# Patient Record
Sex: Female | Born: 1945 | Race: White | Hispanic: No | Marital: Married | Smoking: Former smoker
Health system: Southern US, Community
[De-identification: ages and names within clinical notes are randomized; demographics above are authoritative.]

## PROBLEM LIST (undated history)

## (undated) DIAGNOSIS — R87612 Low grade squamous intraepithelial lesion on cytologic smear of cervix (LGSIL): Secondary | ICD-10-CM

## (undated) DIAGNOSIS — R87613 High grade squamous intraepithelial lesion on cytologic smear of cervix (HGSIL): Secondary | ICD-10-CM

## (undated) DIAGNOSIS — C50919 Malignant neoplasm of unspecified site of unspecified female breast: Secondary | ICD-10-CM

## (undated) DIAGNOSIS — D649 Anemia, unspecified: Secondary | ICD-10-CM

## (undated) DIAGNOSIS — Z9889 Other specified postprocedural states: Secondary | ICD-10-CM

## (undated) DIAGNOSIS — R112 Nausea with vomiting, unspecified: Secondary | ICD-10-CM

## (undated) DIAGNOSIS — Z923 Personal history of irradiation: Secondary | ICD-10-CM

## (undated) DIAGNOSIS — C4491 Basal cell carcinoma of skin, unspecified: Secondary | ICD-10-CM

## (undated) DIAGNOSIS — M8580 Other specified disorders of bone density and structure, unspecified site: Secondary | ICD-10-CM

## (undated) DIAGNOSIS — K5792 Diverticulitis of intestine, part unspecified, without perforation or abscess without bleeding: Secondary | ICD-10-CM

## (undated) DIAGNOSIS — G4762 Sleep related leg cramps: Secondary | ICD-10-CM

## (undated) DIAGNOSIS — K219 Gastro-esophageal reflux disease without esophagitis: Secondary | ICD-10-CM

## (undated) HISTORY — PX: HAND SURGERY: SHX662

## (undated) HISTORY — DX: Diverticulitis of intestine, part unspecified, without perforation or abscess without bleeding: K57.92

## (undated) HISTORY — DX: Gastro-esophageal reflux disease without esophagitis: K21.9

## (undated) HISTORY — DX: Low grade squamous intraepithelial lesion on cytologic smear of cervix (LGSIL): R87.612

## (undated) HISTORY — DX: Other specified disorders of bone density and structure, unspecified site: M85.80

## (undated) HISTORY — PX: EYE SURGERY: SHX253

## (undated) HISTORY — DX: Malignant neoplasm of unspecified site of unspecified female breast: C50.919

## (undated) HISTORY — PX: CERVICAL BIOPSY  W/ LOOP ELECTRODE EXCISION: SUR135

---
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
## 1982-09-22 HISTORY — PX: TUBAL LIGATION: SHX77

## 1985-09-22 DIAGNOSIS — C50919 Malignant neoplasm of unspecified site of unspecified female breast: Secondary | ICD-10-CM

## 1985-09-22 HISTORY — PX: BREAST LUMPECTOMY: SHX2

## 1985-09-22 HISTORY — PX: BREAST SURGERY: SHX581

## 1985-09-22 HISTORY — DX: Malignant neoplasm of unspecified site of unspecified female breast: C50.919

## 1987-09-23 HISTORY — PX: OOPHORECTOMY: SHX86

## 1998-05-29 ENCOUNTER — Ambulatory Visit (HOSPITAL_COMMUNITY): Admission: RE | Admit: 1998-05-29 | Discharge: 1998-05-29 | Payer: Self-pay | Admitting: Family Medicine

## 2001-02-16 ENCOUNTER — Encounter: Payer: Self-pay | Admitting: Obstetrics and Gynecology

## 2001-02-16 ENCOUNTER — Encounter: Admission: RE | Admit: 2001-02-16 | Discharge: 2001-02-16 | Payer: Self-pay | Admitting: Obstetrics and Gynecology

## 2001-02-24 ENCOUNTER — Other Ambulatory Visit: Admission: RE | Admit: 2001-02-24 | Discharge: 2001-02-24 | Payer: Self-pay | Admitting: Obstetrics and Gynecology

## 2001-06-28 ENCOUNTER — Ambulatory Visit (HOSPITAL_COMMUNITY): Admission: RE | Admit: 2001-06-28 | Discharge: 2001-06-28 | Payer: Self-pay | Admitting: Gastroenterology

## 2001-06-28 ENCOUNTER — Encounter (INDEPENDENT_AMBULATORY_CARE_PROVIDER_SITE_OTHER): Payer: Self-pay

## 2001-08-02 ENCOUNTER — Encounter: Payer: Self-pay | Admitting: Gastroenterology

## 2001-08-02 ENCOUNTER — Ambulatory Visit (HOSPITAL_COMMUNITY): Admission: RE | Admit: 2001-08-02 | Discharge: 2001-08-02 | Payer: Self-pay | Admitting: Gastroenterology

## 2003-06-12 ENCOUNTER — Encounter: Payer: Self-pay | Admitting: Obstetrics and Gynecology

## 2003-06-12 ENCOUNTER — Encounter: Admission: RE | Admit: 2003-06-12 | Discharge: 2003-06-12 | Payer: Self-pay | Admitting: Obstetrics and Gynecology

## 2003-06-15 ENCOUNTER — Other Ambulatory Visit: Admission: RE | Admit: 2003-06-15 | Discharge: 2003-06-15 | Payer: Self-pay | Admitting: Obstetrics and Gynecology

## 2003-09-06 ENCOUNTER — Encounter: Admission: RE | Admit: 2003-09-06 | Discharge: 2003-09-06 | Payer: Self-pay | Admitting: Obstetrics and Gynecology

## 2004-10-21 ENCOUNTER — Encounter: Admission: RE | Admit: 2004-10-21 | Discharge: 2004-10-21 | Payer: Self-pay | Admitting: Obstetrics and Gynecology

## 2004-10-31 ENCOUNTER — Other Ambulatory Visit: Admission: RE | Admit: 2004-10-31 | Discharge: 2004-10-31 | Payer: Self-pay | Admitting: Obstetrics and Gynecology

## 2006-10-16 ENCOUNTER — Encounter: Admission: RE | Admit: 2006-10-16 | Discharge: 2006-10-16 | Payer: Self-pay | Admitting: Obstetrics and Gynecology

## 2006-11-02 ENCOUNTER — Other Ambulatory Visit: Admission: RE | Admit: 2006-11-02 | Discharge: 2006-11-02 | Payer: Self-pay | Admitting: Obstetrics and Gynecology

## 2009-07-26 ENCOUNTER — Encounter: Admission: RE | Admit: 2009-07-26 | Discharge: 2009-07-26 | Payer: Self-pay | Admitting: Obstetrics and Gynecology

## 2009-08-06 ENCOUNTER — Encounter: Payer: Self-pay | Admitting: Obstetrics and Gynecology

## 2009-08-06 ENCOUNTER — Other Ambulatory Visit: Admission: RE | Admit: 2009-08-06 | Discharge: 2009-08-06 | Payer: Self-pay | Admitting: Obstetrics and Gynecology

## 2009-08-06 ENCOUNTER — Ambulatory Visit: Payer: Self-pay | Admitting: Obstetrics and Gynecology

## 2009-08-20 ENCOUNTER — Ambulatory Visit: Payer: Self-pay | Admitting: Obstetrics and Gynecology

## 2009-09-25 ENCOUNTER — Ambulatory Visit: Payer: Self-pay | Admitting: Obstetrics and Gynecology

## 2011-02-07 NOTE — Procedures (Signed)
[facility]  Patient:    Christina Edwards, Christina Edwards Visit Number: [id number] MRN: [id number]          Service Type: END Location: ENDO Attending Physician:  Louie Bun Dictated by:   Everardo All Madilyn Fireman, M.D. Proc. Date: 06/28/01 Admit Date:  06/28/2001   CC:         Reuel Boom L. Eda Paschal, M.D.   Procedure Report  PROCEDURE:  Colonoscopy with polypectomy.  SURGEON:  John C. Madilyn Fireman, M.D.  INDICATIONS FOR PROCEDURE:  Intermittent rectal bleeding in a patient with a family history of colon cancer in a first degree relative and personal history of breast cancer.  DESCRIPTION OF PROCEDURE:  The patient was placed in the left lateral decubitus position and placed on the pulse monitor with continuous low flow oxygen delivered by nasal cannula.  She was sedated with 70 mg of IV Demerol and 8 mg of IV Versed.  The Olympus video colonoscope was inserted into the rectum and advanced as far as possible, but despite insertion of the scope its entire length and multiple position changes, abdominal pressure, and torquing maneuvers, I was unable to reach the cecum.  It was not certain, but it was felt that the point of most proximal visualization was probably near the hepatic flexure.  This area, as well as the transverse, descending, and sigmoid colon appeared normal with no masses, polyps, diverticula, or other mucosal abnormalities.  At the rectosigmoid junction, there was a 6 mm sessile polyp which was fulgurated by hot biopsy.  The remainder of the rectum and sigmoid appeared normal.  The colonoscope was then withdrawn and the patient returned to the recovery room in stable condition.  She tolerated the procedure well and there were no immediate complications.  IMPRESSION:  _______ polyp.  PLAN:  Await biopsy results and will need barium enema to visualize the proximal colon at some point. Dictated by:   Everardo All Madilyn Fireman, M.D. Attending Physician:  Louie Bun DD:  06/28/01 TD:  06/28/01 Job: [id number]

## 2011-04-30 ENCOUNTER — Other Ambulatory Visit: Payer: Self-pay | Admitting: Obstetrics and Gynecology

## 2011-04-30 DIAGNOSIS — Z1231 Encounter for screening mammogram for malignant neoplasm of breast: Secondary | ICD-10-CM

## 2011-05-12 ENCOUNTER — Ambulatory Visit
Admission: RE | Admit: 2011-05-12 | Discharge: 2011-05-12 | Disposition: A | Discharge status: Home / Self Care | Payer: 59 | Source: Ambulatory Visit | Attending: Obstetrics and Gynecology | Admitting: Obstetrics and Gynecology

## 2011-05-12 DIAGNOSIS — Z1231 Encounter for screening mammogram for malignant neoplasm of breast: Secondary | ICD-10-CM

## 2011-05-14 ENCOUNTER — Encounter: Payer: Self-pay | Admitting: Gynecology

## 2011-05-14 DIAGNOSIS — N809 Endometriosis, unspecified: Secondary | ICD-10-CM | POA: Insufficient documentation

## 2011-05-14 DIAGNOSIS — C50919 Malignant neoplasm of unspecified site of unspecified female breast: Secondary | ICD-10-CM | POA: Insufficient documentation

## 2011-05-14 DIAGNOSIS — M8580 Other specified disorders of bone density and structure, unspecified site: Secondary | ICD-10-CM | POA: Insufficient documentation

## 2011-05-27 ENCOUNTER — Other Ambulatory Visit (HOSPITAL_COMMUNITY)
Admission: RE | Admit: 2011-05-27 | Discharge: 2011-05-27 | Disposition: A | Discharge status: Home / Self Care | Payer: 59 | Source: Ambulatory Visit | Attending: Obstetrics and Gynecology | Admitting: Obstetrics and Gynecology

## 2011-05-27 ENCOUNTER — Ambulatory Visit (INDEPENDENT_AMBULATORY_CARE_PROVIDER_SITE_OTHER): Payer: 59 | Admitting: Obstetrics and Gynecology

## 2011-05-27 ENCOUNTER — Encounter: Payer: Self-pay | Admitting: Obstetrics and Gynecology

## 2011-05-27 VITALS — BP 102/66 | Ht 70.0 in | Wt 134.0 lb

## 2011-05-27 DIAGNOSIS — Z01419 Encounter for gynecological examination (general) (routine) without abnormal findings: Secondary | ICD-10-CM | POA: Insufficient documentation

## 2011-05-27 DIAGNOSIS — R82998 Other abnormal findings in urine: Secondary | ICD-10-CM

## 2011-05-27 DIAGNOSIS — R252 Cramp and spasm: Secondary | ICD-10-CM

## 2011-05-27 DIAGNOSIS — Z13220 Encounter for screening for lipoid disorders: Secondary | ICD-10-CM

## 2011-05-27 DIAGNOSIS — N952 Postmenopausal atrophic vaginitis: Secondary | ICD-10-CM

## 2011-05-27 NOTE — Progress Notes (Signed)
Patient came to see me today for an annual GYN exam. She has met someone she's now sexually active. She says most of the time she is adequately lubricated. She is however sometimes dry and uses a lubricant. She is orgasmic most of the time. She wanted no know if there was something she could do to make it all the time. She is up-to-date on mammograms. She does her bone density through our office. She does have osteopenia. She sometimes gets leg cramps. She can also get him on the outside of her left hip as well. 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 I don't know whether estrogen receptors were done. She had a lumpectomy by Dr. Rolene Course. She then had radiation treatment. She does not have a medical oncologist.  Physical examination: HEENT within normal limits. Neck: Thyroid not large. No masses. Supraclavicular nodes: not enlarged. Breasts: Examined in both sitting midline position. No skin changes and no masses. Abdomen: Soft no guarding rebound or masses or hernia. Pelvic: External: Within normal limits. BUS: Within normal limits. Vaginal:within normal limits. Good estrogen effect. No evidence of cystocele rectocele or enterocele. Cervix: clean. Uterus: Normal size and shape. Adnexa: No masses. Rectovaginal exam: Confirmatory and negative. Extremities: Within normal limits.  Assessment: 1. Intraductal carcinoma, no existing disease. 2. Occasional atrophic vaginitis 3. Leg cramps and hip cramps.  Plan: 1. Continue yearly mammograms 2. Patient to call Dorena Cookey and schedule another colonoscopy as she is overdue. Her mother had colon cancer. 3. Use replens and astroglyde. If not enough call me and we will discuss estrogen cream and testosterone with Dr. Jamey Ripa.

## 2011-05-27 NOTE — Patient Instructions (Signed)
Call me if medication needed.

## 2011-05-28 LAB — BASIC METABOLIC PANEL
BUN: 17 mg/dL (ref 6–23)
CO2: 26 mEq/L (ref 19–32)
Calcium: 10 mg/dL (ref 8.4–10.5)
Chloride: 108 mEq/L (ref 96–112)
Creat: 0.85 mg/dL (ref 0.50–1.10)
Glucose, Bld: 92 mg/dL (ref 70–99)
Potassium: 5.2 mEq/L (ref 3.5–5.3)
Sodium: 141 mEq/L (ref 135–145)

## 2011-09-03 ENCOUNTER — Other Ambulatory Visit: Payer: Self-pay | Admitting: Gastroenterology

## 2011-10-01 ENCOUNTER — Other Ambulatory Visit: Payer: Self-pay | Admitting: Gastroenterology

## 2011-10-03 ENCOUNTER — Ambulatory Visit
Admission: RE | Admit: 2011-10-03 | Discharge: 2011-10-03 | Disposition: A | Discharge status: Home / Self Care | Payer: 59 | Source: Ambulatory Visit | Attending: Gastroenterology | Admitting: Gastroenterology

## 2012-06-18 ENCOUNTER — Other Ambulatory Visit: Payer: Self-pay | Admitting: Obstetrics and Gynecology

## 2012-06-18 DIAGNOSIS — Z1231 Encounter for screening mammogram for malignant neoplasm of breast: Secondary | ICD-10-CM

## 2012-07-12 ENCOUNTER — Ambulatory Visit
Admission: RE | Admit: 2012-07-12 | Discharge: 2012-07-12 | Disposition: A | Discharge status: Home / Self Care | Payer: 59 | Source: Ambulatory Visit | Attending: Obstetrics and Gynecology | Admitting: Obstetrics and Gynecology

## 2012-07-12 DIAGNOSIS — Z1231 Encounter for screening mammogram for malignant neoplasm of breast: Secondary | ICD-10-CM

## 2012-07-20 ENCOUNTER — Encounter: Payer: 59 | Admitting: Obstetrics and Gynecology

## 2012-07-21 ENCOUNTER — Ambulatory Visit (INDEPENDENT_AMBULATORY_CARE_PROVIDER_SITE_OTHER): Payer: 59 | Admitting: Obstetrics and Gynecology

## 2012-07-21 ENCOUNTER — Other Ambulatory Visit (HOSPITAL_COMMUNITY)
Admission: RE | Admit: 2012-07-21 | Discharge: 2012-07-21 | Disposition: A | Discharge status: Home / Self Care | Payer: 59 | Source: Ambulatory Visit | Attending: Obstetrics and Gynecology | Admitting: Obstetrics and Gynecology

## 2012-07-21 ENCOUNTER — Inpatient Hospital Stay (HOSPITAL_COMMUNITY): Admission: RE | Admit: 2012-07-21 | Payer: Self-pay | Source: Ambulatory Visit

## 2012-07-21 ENCOUNTER — Encounter: Payer: Self-pay | Admitting: Obstetrics and Gynecology

## 2012-07-21 VITALS — BP 120/76 | Ht 69.0 in | Wt 138.0 lb

## 2012-07-21 DIAGNOSIS — Z01419 Encounter for gynecological examination (general) (routine) without abnormal findings: Secondary | ICD-10-CM | POA: Insufficient documentation

## 2012-07-21 DIAGNOSIS — Z1151 Encounter for screening for human papillomavirus (HPV): Secondary | ICD-10-CM | POA: Insufficient documentation

## 2012-07-21 DIAGNOSIS — R87810 Cervical high risk human papillomavirus (HPV) DNA test positive: Secondary | ICD-10-CM | POA: Insufficient documentation

## 2012-07-21 DIAGNOSIS — K219 Gastro-esophageal reflux disease without esophagitis: Secondary | ICD-10-CM | POA: Insufficient documentation

## 2012-07-21 LAB — CBC WITH DIFFERENTIAL/PLATELET
Basophils Absolute: 0 10*3/uL (ref 0.0–0.1)
Basophils Relative: 0 % (ref 0–1)
Eosinophils Absolute: 0.1 10*3/uL (ref 0.0–0.7)
Eosinophils Relative: 2 % (ref 0–5)
HCT: 38 % (ref 36.0–46.0)
Hemoglobin: 12.7 g/dL (ref 12.0–15.0)
Lymphocytes Relative: 38 % (ref 12–46)
Lymphs Abs: 2.1 10*3/uL (ref 0.7–4.0)
MCH: 32.1 pg (ref 26.0–34.0)
MCHC: 33.4 g/dL (ref 30.0–36.0)
MCV: 96 fL (ref 78.0–100.0)
Monocytes Absolute: 0.4 10*3/uL (ref 0.1–1.0)
Monocytes Relative: 7 % (ref 3–12)
Neutro Abs: 3 10*3/uL (ref 1.7–7.7)
Neutrophils Relative %: 53 % (ref 43–77)
Platelets: 325 10*3/uL (ref 150–400)
RBC: 3.96 MIL/uL (ref 3.87–5.11)
RDW: 13.6 % (ref 11.5–15.5)
WBC: 5.6 10*3/uL (ref 4.0–10.5)

## 2012-07-21 NOTE — Progress Notes (Signed)
Patient came to see me today for her annual GYN exam. She is doing well. She still has hot flashes but they're tolerable. She has some vaginal dryness but does not need medication. She is status post lumpectomy and radiation in 1987 for a left breast intraductal carcinoma. She was 66 years old. Her mother had ovarian cancer. We checked Corrie Dandy for BRCA1 and BRCA2 and she was negative.  She has yearly mammograms. Her last was September, 2013. She has not had a recurrence. We are watching her with osteopenia. Her fracture risk is nonelevated. She is due for followup bone density. She is having no vaginal bleeding. She is having no pelvic pain. In 1989 she had a left salpingo-oophorectomy for endometriosis. She has always had normal Pap smears. Her last Pap smear was 2012.  Physical examination:Christina Edwards present. HEENT within normal limits. Neck: Thyroid not large. No masses. Supraclavicular nodes: not enlarged. Breasts: Examined in both sitting and lying  position. No skin changes and no masses. Abdomen: Soft no guarding rebound or masses or hernia. Pelvic: External: Within normal limits. BUS: Within normal limits. Vaginal:within normal limits. Good estrogen effect. No evidence of cystocele rectocele or enterocele. Cervix: clean. Uterus: Normal size and shape. Adnexa: No masses. Rectovaginal exam: Confirmatory and negative. Extremities: Within normal limits.  Assessment: #1. Breast cancer at 47 years old #2. Mother with ovarian cancer #3. Patient BRCA1 and BRCA2 negative. #4. Osteopenia #5. Menopausal symptoms-tolerable.  Plan: Continue yearly mammograms. Schedule  bone density.The new Pap smear guidelines were discussed with the patient. Pap done at patient's request.

## 2012-07-21 NOTE — Patient Instructions (Signed)
Schedule bone density.    

## 2012-07-21 NOTE — Addendum Note (Signed)
Addended by: Dayna Barker on: 07/21/2012 10:46 AM   Modules accepted: Orders

## 2012-07-22 LAB — URINALYSIS W MICROSCOPIC + REFLEX CULTURE
Bacteria, UA: NONE SEEN
Bilirubin Urine: NEGATIVE
Casts: NONE SEEN
Crystals: NONE SEEN
Glucose, UA: NEGATIVE mg/dL
Hgb urine dipstick: NEGATIVE
Ketones, ur: NEGATIVE mg/dL
Leukocytes, UA: NEGATIVE
Nitrite: NEGATIVE
Protein, ur: NEGATIVE mg/dL
Specific Gravity, Urine: <1.005 — ABNORMAL LOW (ref 1.005–1.030)
Squamous Epithelial / LPF: NONE SEEN
Urobilinogen, UA: 0.2 mg/dL (ref 0.0–1.0)
pH: 7 (ref 5.0–8.0)

## 2012-07-26 ENCOUNTER — Encounter: Payer: Self-pay | Admitting: Obstetrics and Gynecology

## 2012-08-25 ENCOUNTER — Ambulatory Visit (INDEPENDENT_AMBULATORY_CARE_PROVIDER_SITE_OTHER): Payer: Medicare Other | Admitting: Obstetrics and Gynecology

## 2012-08-25 DIAGNOSIS — N871 Moderate cervical dysplasia: Secondary | ICD-10-CM

## 2012-08-25 DIAGNOSIS — B977 Papillomavirus as the cause of diseases classified elsewhere: Secondary | ICD-10-CM

## 2012-08-25 NOTE — Addendum Note (Signed)
Addended by: Dayna Barker on: 08/25/2012 04:42 PM   Modules accepted: Orders

## 2012-08-25 NOTE — Progress Notes (Signed)
Subjective:     Patient ID: Christina Edwards, female   DOB: [birth year], 66 y.o.   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 She has always had normal Pap smears. She started a new sexual relationship one year ago after being not sexually active  for 10 years. She returns for colposcopy.   Review of Systemsnot applicable.     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    Plan:     ECC, blind biopsies, and biopsy of white epithelium done. Discussed LEEP with patient. Booklet given. We will call her with pathology

## 2012-08-25 NOTE — Patient Instructions (Signed)
We will call you with biopsy results.

## 2012-08-30 ENCOUNTER — Other Ambulatory Visit: Payer: Self-pay | Admitting: Obstetrics and Gynecology

## 2012-08-30 DIAGNOSIS — M8580 Other specified disorders of bone density and structure, unspecified site: Secondary | ICD-10-CM

## 2012-08-31 ENCOUNTER — Ambulatory Visit (INDEPENDENT_AMBULATORY_CARE_PROVIDER_SITE_OTHER): Payer: Medicare Other

## 2012-08-31 DIAGNOSIS — M899 Disorder of bone, unspecified: Secondary | ICD-10-CM

## 2012-08-31 DIAGNOSIS — M8580 Other specified disorders of bone density and structure, unspecified site: Secondary | ICD-10-CM

## 2012-08-31 DIAGNOSIS — M949 Disorder of cartilage, unspecified: Secondary | ICD-10-CM

## 2012-09-01 ENCOUNTER — Ambulatory Visit: Payer: 59 | Admitting: Obstetrics and Gynecology

## 2012-09-06 ENCOUNTER — Ambulatory Visit (INDEPENDENT_AMBULATORY_CARE_PROVIDER_SITE_OTHER): Payer: Medicare Other | Admitting: Obstetrics and Gynecology

## 2012-09-06 DIAGNOSIS — N871 Moderate cervical dysplasia: Secondary | ICD-10-CM

## 2012-09-06 DIAGNOSIS — B977 Papillomavirus as the cause of diseases classified elsewhere: Secondary | ICD-10-CM

## 2012-09-06 NOTE — Progress Notes (Signed)
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   Anesthesia: see below   Procedure:  LEEP (Loop electrosurgical excision procedure) Description of Operation:  After the patient was verbally counseled the patient was placed in the low lithotomy position.  A coated speculum was inserted into the vagina and colposcopic examination was performed with 4% acidic acid with findings noted above.    Approximately 2 cc's of 2% xylocaine with epinephrine was infiltrated deep near the outer margin of the transformation zone circumferentially at 12, 3, 6, and 9 o'clock positions.  The [facility] Electrosurgical Generator was then turned on after the patient was grounded with pad electrode on her thigh and jewelry removed.  The settings on the generator were Blend 1 current 60 watts cut and 60  watts on the coagulation mode.  A size 20x12 loop electrode was utilized to exercise the atypical transformation zone.  The tip of the electrode was placed 3 mm from the edge of the lesion at 3  o'clock position.  The electrode was moved slowly over the lesion ( initially without the generator power ) to determine if lesion was within the loop limits.  A vaginal wall retractor was not  used.  The loop was then repositioned and the  footpedal depressed.  A slight pressure on the shaft was applied and the loop was extended into the tissue up to its crossbar to a depth of 7 mm, then with steady, slow motion across and underneath the lesional tissue exited 3 mm beyond the edge of the lesion. An endocervical button  Was not excised.  The loop electrode was replaced with ball electrode set at 50 watts and the base of the crater was fulgurated circumferentially.  Monsell's paint was then applied for additional hemostasis.  The specimen was cut suture was  at 12 o'clock position of  cervical biopsy specimen for orientation, and placed in formation fixative for pathology evaluation.  Patient tolerated the procedure well with minimal blood loss and without any complications.  After the procedure patient left office with stable vital signs and instructions sheet.

## 2012-09-06 NOTE — Patient Instructions (Signed)
We will call you with pathology report. 

## 2012-09-06 NOTE — Addendum Note (Signed)
Addended by: Dayna Barker on: 09/06/2012 12:59 PM   Modules accepted: Orders

## 2012-09-10 DIAGNOSIS — N871 Moderate cervical dysplasia: Secondary | ICD-10-CM | POA: Insufficient documentation

## 2012-10-07 ENCOUNTER — Institutional Professional Consult (permissible substitution): Payer: Medicare Other | Admitting: Women's Health

## 2012-10-08 ENCOUNTER — Ambulatory Visit (INDEPENDENT_AMBULATORY_CARE_PROVIDER_SITE_OTHER): Payer: Medicare Other | Admitting: Gynecology

## 2012-10-08 ENCOUNTER — Encounter: Payer: Self-pay | Admitting: Gynecology

## 2012-10-08 DIAGNOSIS — N949 Unspecified condition associated with female genital organs and menstrual cycle: Secondary | ICD-10-CM

## 2012-10-08 DIAGNOSIS — N871 Moderate cervical dysplasia: Secondary | ICD-10-CM

## 2012-10-08 NOTE — Progress Notes (Signed)
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 She had her first abnormal Pap smear October 2013 showing low-grade SIL with positive high risk HPV. 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 She was recommended for 6 month Pap smear but has a lot of questions particularly pertaining to HPV. Historically she resumed intercourse approximately one year ago after a number of years of abstinence. No history of abnormal Pap smears prior to this episode. I discussed with her that more likely her most recent partner is the source of her HPV although cannot be sure that she did not carry this from years ago exposure. Natural history of usual clearance between 1-2 years also reviewed. She did have a high risk screen and the increased potential for high-grade dysplasia and cancer reviewed. I also discussed the sexually-transmitted nature and she had many questions as far as condoms/oral contact and other contact issues. I reviewed all this with her to her satisfaction and she plans on returning in 6 months for Pap smear. The need for self vulvar exam was also discussed and the potential for vulvar lesions reviewed.

## 2012-10-08 NOTE — Patient Instructions (Signed)
Follow up in 6 months for Pap smear. Sooner if any questions.

## 2013-03-07 ENCOUNTER — Other Ambulatory Visit (HOSPITAL_COMMUNITY)
Admission: RE | Admit: 2013-03-07 | Discharge: 2013-03-07 | Disposition: A | Discharge status: Home / Self Care | Payer: Medicare Other | Source: Ambulatory Visit | Attending: Gynecology | Admitting: Gynecology

## 2013-03-07 ENCOUNTER — Ambulatory Visit (INDEPENDENT_AMBULATORY_CARE_PROVIDER_SITE_OTHER): Payer: Medicare Other | Admitting: Gynecology

## 2013-03-07 ENCOUNTER — Encounter: Payer: Self-pay | Admitting: Gynecology

## 2013-03-07 DIAGNOSIS — R87810 Cervical high risk human papillomavirus (HPV) DNA test positive: Secondary | ICD-10-CM | POA: Insufficient documentation

## 2013-03-07 DIAGNOSIS — Z01419 Encounter for gynecological examination (general) (routine) without abnormal findings: Secondary | ICD-10-CM | POA: Insufficient documentation

## 2013-03-07 DIAGNOSIS — Z1151 Encounter for screening for human papillomavirus (HPV): Secondary | ICD-10-CM | POA: Insufficient documentation

## 2013-03-07 DIAGNOSIS — N871 Moderate cervical dysplasia: Secondary | ICD-10-CM

## 2013-03-07 NOTE — Addendum Note (Signed)
Addended by: Dayna Barker on: 03/07/2013 10:01 AM   Modules accepted: Orders

## 2013-03-07 NOTE — Progress Notes (Signed)
Patient presents for followup Pap smear. 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 She had her first abnormal Pap smear October 2013 showing low-grade SIL with positive high risk HPV. 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 She was recommended for 6 month Pap smear but has a lot of questions particularly pertaining to HPV.  Patient also notes spotting after intercourse several times in the last month. No spontaneous spotting.  Exam was Kym Asst. External BUS vagina with atrophic changes.  Cervix flush with the upper vagina with of granulation tissue from the os. Pap/HPV done. Granulation tissue removed with biopsy forceps and sent to pathology. Silver nitrate applied afterwards. Bimanual uterus normal size, mobile nontender. Adnexa without masses or tenderness.  Assessment and plan: 1. History of dysplasia as outlined above. If Pap smear normal in plan one year followup. She asked if it was positive then we would proceed with colposcopy. I did relate with her though her cervix is flush with the upper vagina and I think retreatment such as repeat LEEP would be difficult. Will rediscuss as needed. 2. Granulation tissue. I think this accounts for her postcoital spotting. It was removed with silver nitrate applied. If spotting continues she'll call and we'll pursue more involved evaluation. She'll followup for the pathology results.

## 2013-03-07 NOTE — Patient Instructions (Addendum)
Office will call you with the biopsy and Pap smear results.

## 2013-04-07 ENCOUNTER — Encounter: Payer: Self-pay | Admitting: Gynecology

## 2013-04-07 ENCOUNTER — Ambulatory Visit (INDEPENDENT_AMBULATORY_CARE_PROVIDER_SITE_OTHER): Payer: Medicare Other | Admitting: Gynecology

## 2013-04-07 DIAGNOSIS — N882 Stricture and stenosis of cervix uteri: Secondary | ICD-10-CM

## 2013-04-07 DIAGNOSIS — R6889 Other general symptoms and signs: Secondary | ICD-10-CM

## 2013-04-07 DIAGNOSIS — IMO0002 Reserved for concepts with insufficient information to code with codable children: Secondary | ICD-10-CM

## 2013-04-07 NOTE — Patient Instructions (Signed)
Office will call you with the biopsy results 

## 2013-04-07 NOTE — Progress Notes (Signed)
Patient ID: Christina Edwards, female   DOB: [birth year], 67 y.o.   MRN: [id number] Patient presents for colposcopy with a history of  her first abnormal Pap smear October 2013 showing low-grade SIL with positive high risk HPV. 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  Followup Pap smear 03/2013 showed LGSIL 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, negative subtype 16/18/45.   Exam was Administrator, [employer] vagina with atrophic changes. Cervix scarred from the LEEP with cervical os stenosis. High anterior in the vaginal vault.  Paracervical block 1% lidocaine was placed and single-tooth tenaculum anterior lip stabilization to allow visualization of the cervix. Pinpoint cervical os noted.   Colposcopy was performed after acetic acid cleanse and no abnormalities but no transformation zone visualized. Sequential dilatation performed. I was unable to sound the uterus with gentle pressure but was able to negotiate the endocervical canal. ECC was performed. Physical Exam  Genitourinary:      Assessment and plan. History of cervical dysplasia as above note.  Cervix atrophic and scarred. Will followup for ECC results. If negative plan expected management with repeat Pap smear in 6 months. If otherwise discussed possible need for repeat LEEP versus hysterectomy.  My concern with the LEEP is the scarring of the cervix. She does have a history of exploratory laparotomy for endometriosis with removal of her left ovary. Possible LAVH versus TAH. Will rediscuss with pathology results. She does clearly understand that even with hysterectomy the virus is a global infection and she is at risk for persistent vaginal dysplasia or vulvar dysplasia.

## 2013-04-07 NOTE — Addendum Note (Signed)
Addended by: Dayna Barker on: 04/07/2013 09:54 AM   Modules accepted: Orders

## 2013-04-12 ENCOUNTER — Telehealth: Payer: Self-pay | Admitting: Gynecology

## 2013-04-12 NOTE — Telephone Encounter (Signed)
Forwarded to [employer] to do referrral to [employer].

## 2013-04-12 NOTE — Telephone Encounter (Signed)
I called patient with ECC results from her recent inadequate normal colposcopy.  She has a history of  her first abnormal Pap smear October 2013 showing low-grade SIL with positive high risk HPV. 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  Followup Pap smear 03/2013 showed LGSIL 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, negative subtype 16/18/45. Most recent ECC showed fragments of dysplastic epithelium and under comments "favor low-grade SIL".  Discussed with patient the issue as to whether to proceed with something more aggressive like attempted re\re LEEP recognizing her scarred anatomic limitations up to and including hysterectomy. I've recommended that we pursue a second opinion with a gynecologic oncologist and that we will go ahead and set this up through [facility] for a second opinion consult. Patient agrees with this and knows the importance of followup and then will further discuss after the second opinion consult.

## 2013-04-13 ENCOUNTER — Telehealth: Payer: Self-pay | Admitting: *Deleted

## 2013-04-13 NOTE — Telephone Encounter (Signed)
Message copied by Aura Camps on Wed Apr 13, 2013  9:39 AM ------      Message from: Keenan Bachelor      Created: Tue Apr 12, 2013 12:36 PM      Regarding: referral       Call Documentation         Dara Lords, MD at 04/12/2013 12:27 PM         Status: Signed                                  I called patient with ECC results from her recent inadequate normal colposcopy.  She has a history of  her first abnormal Pap smear October 2013 showing low-grade SIL with positive high risk HPV. 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  Followup Pap smear 03/2013 showed LGSIL 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, negative subtype 16/18/45. Most recent ECC showed fragments of dysplastic epithelium and under comments "favor low-grade SIL".                  Discussed with patient the issue as to whether to proceed with something more aggressive like attempted re\re LEEP recognizing her scarred anatomic limitations up to and including hysterectomy. I've recommended that we pursue a second opinion with a gynecologic oncologist and that we will go ahead and set this up through [facility] for a second opinion consult. Patient agrees with this and knows the importance of followup and then will further discuss after the second opinion consult.                     ------

## 2013-04-13 NOTE — Telephone Encounter (Signed)
appt with Dr.Clarke-Pearson on 04/26/13 @ 2:00 pm pt informed.

## 2013-04-26 ENCOUNTER — Ambulatory Visit: Payer: Medicare Other | Attending: Gynecology | Admitting: Gynecology

## 2013-04-26 ENCOUNTER — Encounter: Payer: Self-pay | Admitting: Gynecology

## 2013-04-26 VITALS — BP 108/62 | HR 64 | Temp 97.9°F | Resp 16 | Ht 69.02 in | Wt 143.0 lb

## 2013-04-26 DIAGNOSIS — N882 Stricture and stenosis of cervix uteri: Secondary | ICD-10-CM | POA: Insufficient documentation

## 2013-04-26 DIAGNOSIS — N879 Dysplasia of cervix uteri, unspecified: Secondary | ICD-10-CM | POA: Insufficient documentation

## 2013-04-26 DIAGNOSIS — IMO0002 Reserved for concepts with insufficient information to code with codable children: Secondary | ICD-10-CM | POA: Insufficient documentation

## 2013-04-26 NOTE — Patient Instructions (Signed)
Return to see Dr. Audie Box in 6 months for a repeat Pap smear.

## 2013-04-26 NOTE — Progress Notes (Signed)
Consult Note: Gyn-Onc   Christina Edwards 67 y.o. female  Chief Complaint  Patient presents with  . Cervical Dysplasia    New Consult ( 2nd Opinion)    Assessment : CIN 1-2 now with a Pap smear showing low-grade SIL.  Plan. I lengthy discussion with the patient regarding HPV and cervical dysplasia. Given the fact that she has only had low-grade SIL Pap smears, I would recommend the patient be followed at six-month intervals with Pap smears. Should be a Pap smear progress to a high-grade lesion cannot undertake further investigation including possible cold knife conization. At this point in time I do not think conization or hysterectomy would be advised.  All the patient's questions are answered. She is aware of oral transmission.  She returned to the care of Dr. Audie Box for repeat Pap smear in 6 months.  HPI: 67 year old white female seen in consultation request of Dr. Chiquita Loth regarding management of an abnormal Pap smear.  The patient reports she had normal Pap smears with the possibility of one abnormal in the remote past. There is clear documentation that in 2010 and 2012 she had normal Pap smears.  Pap smear 07/21/2012 showed low-grade SIL and high-risk HPV typing. She underwent colposcopy and biopsy on 08/26/2012 showing a positive endocervical curettage.  09/06/2012 she underwent a LEEP procedure showing CIN-1 and 2.  Pap smear in June 2014 showed low-grade SIL. 04/07/2013 the patient had an endocervical curettage showing dysplastic epithelium favoring low grade SIL.  Review of Systems:10 point review of systems is negative except as noted in interval history.   Vitals: Blood pressure 108/62, pulse 64, temperature 97.9 F (36.6 C), temperature source Oral, resp. rate 16, height 5' 9.02" (1.753 m), weight 143 lb (64.864 kg), last menstrual period 09/22/2000.  Physical Exam: General : The patient is a healthy woman in no acute distress.  HEENT: normocephalic, extraoccular  movements normal; neck is supple without thyromegally  Lynphnodes: Supraclavicular and inguinal nodes not enlarged  Abdomen: Soft, non-tender, no ascites, no organomegally, no masses, no hernias  Pelvic:  EGBUS: Normal female  Vagina: Normal, no lesions  Urethra and Bladder: Normal, non-tender  Cervix: Flush with the vaginal vault. Cervix is stenotic Uterus: Normal shape size and consistency Bi-manual examination: Non-tender; no adenxal masses or nodularity  Rectal: normal sphincter tone, no masses, no blood  Lower extremities: No edema or varicosities. Normal range of motion    Procedure note: After verbal informed consent colposcopy is performed using a white light and green filter and acetic acid. The cervix is stenotic. The exocervix and vagina seen to be free of any problems.    Allergies  Allergen Reactions  . Adhesive (Tape)   . Sulfa Antibiotics     Past Medical History  Diagnosis Date  . Endometriosis   . Osteopenia   . Breast cancer 1987    Left breast intraductal  . GERD (gastroesophageal reflux disease)     Past Surgical History  Procedure Laterality Date  . Oophorectomy  1989    LSO  . Tubal ligation  1984  . Appendectomy  1954  . Tonsillectomy  1955  . Breast surgery  1987    Lumpectomy  . Eye surgery      laser to fix torn retina  . Hand surgery      left hand    Current Outpatient Prescriptions  Medication Sig Dispense Refill  . Acetaminophen (TYLENOL 8 HOUR PO) Take by mouth. Prn       .  aspirin 81 MG tablet Take 81 mg by mouth daily.      . Calcium Carbonate-Simethicone (TUMS PLUS PO) Take by mouth.      . loratadine (CLARITIN) 10 MG tablet Take 10 mg by mouth daily.        . pantoprazole (PROTONIX) 40 MG tablet Take 40 mg by mouth daily.      . valACYclovir (VALTREX) 1000 MG tablet Take 1,000 mg by mouth 2 (two) times daily as needed.       No current facility-administered medications for this visit.    History   Social History  .  Marital Status: Married    Spouse Name: N/A    Number of Children: N/A  . Years of Education: N/A   Occupational History  . Not on file.   Social History Main Topics  . Smoking status: Former Games developer  . Smokeless tobacco: Not on file     Comment: in college not much  . Alcohol Use: No  . Drug Use: No  . Sexually Active: Yes    Birth Control/ Protection: Surgical, Post-menopausal   Other Topics Concern  . Not on file   Social History Narrative  . No narrative on file    Family History  Problem Relation Age of Onset  . Cancer Mother     Colon cancer  . Hypertension Mother   . Ovarian cancer Mother   . Heart disease Father   . Breast cancer Paternal Grandmother     Age 47's      CLARKE-PEARSON,Varsha Knock L, MD 04/26/2013, 10:01 AM

## 2013-04-27 ENCOUNTER — Telehealth: Payer: Self-pay | Admitting: Gynecology

## 2013-04-27 NOTE — Telephone Encounter (Signed)
Tell patient that I received a consult from Dr. Kerri Perches. He recommended followup Pap smear in 6 months and we'll go ahead and set up an appointment in December for a repeat Pap smear.

## 2013-04-27 NOTE — Telephone Encounter (Signed)
Pt informed with the below note, transferred to front desk.  

## 2013-06-20 ENCOUNTER — Other Ambulatory Visit: Payer: Self-pay

## 2013-06-20 DIAGNOSIS — Z1231 Encounter for screening mammogram for malignant neoplasm of breast: Secondary | ICD-10-CM

## 2013-07-12 ENCOUNTER — Ambulatory Visit
Admission: RE | Admit: 2013-07-12 | Discharge: 2013-07-12 | Disposition: A | Discharge status: Home / Self Care | Payer: Medicare Other | Source: Ambulatory Visit

## 2013-07-12 DIAGNOSIS — Z1231 Encounter for screening mammogram for malignant neoplasm of breast: Secondary | ICD-10-CM

## 2013-08-22 ENCOUNTER — Other Ambulatory Visit: Payer: Self-pay | Admitting: Family Medicine

## 2013-08-22 ENCOUNTER — Ambulatory Visit
Admission: RE | Admit: 2013-08-22 | Discharge: 2013-08-22 | Disposition: A | Discharge status: Home / Self Care | Payer: Medicare Other | Source: Ambulatory Visit | Attending: Family Medicine | Admitting: Family Medicine

## 2013-08-22 DIAGNOSIS — R0789 Other chest pain: Secondary | ICD-10-CM

## 2013-09-05 ENCOUNTER — Ambulatory Visit: Payer: Medicare Other | Admitting: Gynecology

## 2013-09-06 ENCOUNTER — Ambulatory Visit (INDEPENDENT_AMBULATORY_CARE_PROVIDER_SITE_OTHER): Payer: Medicare Other | Admitting: Gynecology

## 2013-09-06 ENCOUNTER — Other Ambulatory Visit (HOSPITAL_COMMUNITY)
Admission: RE | Admit: 2013-09-06 | Discharge: 2013-09-06 | Disposition: A | Discharge status: Home / Self Care | Payer: Medicare Other | Source: Ambulatory Visit | Attending: Gynecology | Admitting: Gynecology

## 2013-09-06 ENCOUNTER — Encounter: Payer: Self-pay | Admitting: Gynecology

## 2013-09-06 VITALS — BP 120/76 | Ht 69.0 in | Wt 150.0 lb

## 2013-09-06 DIAGNOSIS — M899 Disorder of bone, unspecified: Secondary | ICD-10-CM

## 2013-09-06 DIAGNOSIS — Z124 Encounter for screening for malignant neoplasm of cervix: Secondary | ICD-10-CM | POA: Insufficient documentation

## 2013-09-06 DIAGNOSIS — N952 Postmenopausal atrophic vaginitis: Secondary | ICD-10-CM

## 2013-09-06 DIAGNOSIS — R87810 Cervical high risk human papillomavirus (HPV) DNA test positive: Secondary | ICD-10-CM | POA: Insufficient documentation

## 2013-09-06 DIAGNOSIS — Z1151 Encounter for screening for human papillomavirus (HPV): Secondary | ICD-10-CM | POA: Insufficient documentation

## 2013-09-06 DIAGNOSIS — M8580 Other specified disorders of bone density and structure, unspecified site: Secondary | ICD-10-CM

## 2013-09-06 DIAGNOSIS — IMO0002 Reserved for concepts with insufficient information to code with codable children: Secondary | ICD-10-CM

## 2013-09-06 DIAGNOSIS — R6889 Other general symptoms and signs: Secondary | ICD-10-CM

## 2013-09-06 NOTE — Patient Instructions (Addendum)
Followup for Pap smear results. Followup for repeat Pap smear as recommended when you followup on the results.

## 2013-09-06 NOTE — Progress Notes (Signed)
Christina Edwards September 24, 1945 [id number]        67 y.o.  G1P1001 for followup exam.  Former patient of Dr. Eda Paschal. Several issues noted below.  Past medical history,surgical history, problem list, medications, allergies, family history and social history were all reviewed and documented in the EPIC chart.  ROS:  Performed and pertinent positives and negatives are included in the history, assessment and plan .  Exam: Kim assistant Filed Vitals:   09/06/13 1218  BP: 120/76  Height: 5\' 9"  (1.753 m)  Weight: 150 lb (68.04 kg)   General appearance  Normal Skin grossly normal Head/Neck normal with no cervical or supraclavicular adenopathy thyroid normal Lungs  clear Cardiac RR, without RMG Abdominal  soft, nontender, without masses, organomegaly or hernia Breasts  examined lying and sitting without masses, retractions, discharge or axillary adenopathy. Well-healed left lumpectomy scar Pelvic  Ext/BUS/vagina  with generalized atrophic changes.  Cervix  flush with upper vagina. Cervical os clearly visible. No gross lesions noted. Pap/HPV done  Uterus  anteverted, normal size, shape and contour, midline and mobile nontender   Adnexa  Without masses or tenderness    Anus and perineum  Normal   Rectovaginal  Normal sphincter tone without palpated masses or tenderness.    Assessment/Plan:  67 y.o. G15P1001 female for annual exam.   1. Postmenopausal/atrophic genital changes. Patient without significant symptoms of night sweats, hot flashes vaginal dryness or dyspareunia. No vaginal bleeding at all. Will continue to monitor. Call if any vaginal bleeding. 2. 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  Followup Pap smear 03/2013 showed LGSIL 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, negative  subtype 16/18/45. Colposcopy with ECC showed a fragment LGSIL epithelium. She saw Dr. De Blanch in consultation 04/2013 who recommended continual expectant management with Pap smear at 6 month interval. Pap/HPV done today. We'll triage based upon results. She does have significant scarring from her LEEP where her cervix is flush with the upper vagina. 3. History of breast cancer. Exam NED. Mammography 06/2013. Continue with annual mammography. SBE monthly reviewed. 4. Osteopenia. DEXA 08/2012 with T score -2.0. Stable from prior exam. FRAX 7.6%/0.8%. Continue to monitor with repeat DEXA next year or 2 he her interval. Patient's going to check to make sure she had vitamin D level at her primary physician's office or drawn with her next blood draw. 5. Colonoscopy 2012. Repeat at their recommended interval. 6. Health maintenance. No blood work done as this is all done at her primary physician's office. Followup for Pap smear results and we will decide when to repeat this..   Note: This document was prepared with digital dictation and possible smart phrase technology. Any transcriptional errors that result from this process are unintentional.   Dara Lords MD, 12:40 PM 09/06/2013

## 2013-09-06 NOTE — Addendum Note (Signed)
Addended by: Dayna Barker on: 09/06/2013 02:05 PM   Modules accepted: Orders

## 2013-09-07 LAB — URINALYSIS W MICROSCOPIC + REFLEX CULTURE
Bacteria, UA: NONE SEEN
Bilirubin Urine: NEGATIVE
Casts: NONE SEEN
Crystals: NONE SEEN
Glucose, UA: NEGATIVE mg/dL
Hgb urine dipstick: NEGATIVE
Ketones, ur: NEGATIVE mg/dL
Leukocytes, UA: NEGATIVE
Nitrite: NEGATIVE
Protein, ur: NEGATIVE mg/dL
Specific Gravity, Urine: 1.016 (ref 1.005–1.030)
Urobilinogen, UA: 0.2 mg/dL (ref 0.0–1.0)
pH: 6 (ref 5.0–8.0)

## 2013-09-14 ENCOUNTER — Encounter: Payer: Self-pay | Admitting: Gynecology

## 2013-09-14 ENCOUNTER — Encounter: Payer: Self-pay | Admitting: Obstetrics and Gynecology

## 2014-03-07 ENCOUNTER — Ambulatory Visit (INDEPENDENT_AMBULATORY_CARE_PROVIDER_SITE_OTHER): Payer: Medicare Other | Admitting: Gynecology

## 2014-03-07 ENCOUNTER — Other Ambulatory Visit (HOSPITAL_COMMUNITY)
Admission: RE | Admit: 2014-03-07 | Discharge: 2014-03-07 | Disposition: A | Discharge status: Home / Self Care | Payer: Medicare Other | Source: Ambulatory Visit | Attending: Gynecology | Admitting: Gynecology

## 2014-03-07 ENCOUNTER — Ambulatory Visit: Payer: Medicare Other | Admitting: Gynecology

## 2014-03-07 ENCOUNTER — Encounter: Payer: Self-pay | Admitting: Gynecology

## 2014-03-07 DIAGNOSIS — R6889 Other general symptoms and signs: Secondary | ICD-10-CM

## 2014-03-07 DIAGNOSIS — Z1151 Encounter for screening for human papillomavirus (HPV): Secondary | ICD-10-CM | POA: Diagnosis present

## 2014-03-07 DIAGNOSIS — R87810 Cervical high risk human papillomavirus (HPV) DNA test positive: Secondary | ICD-10-CM

## 2014-03-07 DIAGNOSIS — Z124 Encounter for screening for malignant neoplasm of cervix: Secondary | ICD-10-CM | POA: Diagnosis present

## 2014-03-07 DIAGNOSIS — IMO0002 Reserved for concepts with insufficient information to code with codable children: Secondary | ICD-10-CM

## 2014-03-07 NOTE — Patient Instructions (Signed)
Follow-up for Pap smear results. 

## 2014-03-07 NOTE — Progress Notes (Signed)
Christina Edwards Feb 19, 1946 [id number]        68 y.o.  G1P1001 patient presents for Pap smear.  History of  her first abnormal Pap smear October 2013 showing low-grade SIL with positive high risk HPV. 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  Followup Pap smear 03/2013 showed LGSIL 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, negative subtype 16/18/45. Colposcopy with ECC showed a fragment LGSIL epithelium. She saw Dr. Marti Sleigh in consultation 04/2013 who recommended continual expectant management with Pap smear at 6 month interval.  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   Past medical history,surgical history, problem list, medications, allergies, family history and social history were all reviewed and documented in the EPIC chart.  Directed ROS with pertinent positives and negatives documented in the history of present illness/assessment and plan.  Exam: Kim assistant General appearance  Normal External BUS vagina with atrophic changes. Cervix flush with the upper vagina without clear cervical os identified. Pap/HPV done. Uterus normal size midline mobile nontender. Adnexa without masses or tenderness   Assessment/Plan:  68 y.o. G1P1001 with above history. Pap/HPV done. Patient will followup with results and we will treat based on these results.   Note: This document was prepared with digital dictation and possible smart phrase technology. Any transcriptional errors that result from this process are unintentional.   [name] MD, 4:18 PM 03/07/2014

## 2014-03-07 NOTE — Addendum Note (Signed)
Addended by: Nelva Nay on: 03/07/2014 04:30 PM   Modules accepted: Orders

## 2014-03-09 LAB — CYTOLOGY - PAP

## 2014-03-14 ENCOUNTER — Encounter: Payer: Self-pay | Admitting: Gynecology

## 2014-06-20 ENCOUNTER — Other Ambulatory Visit: Payer: Self-pay

## 2014-06-20 DIAGNOSIS — Z1231 Encounter for screening mammogram for malignant neoplasm of breast: Secondary | ICD-10-CM

## 2014-07-13 ENCOUNTER — Ambulatory Visit
Admission: RE | Admit: 2014-07-13 | Discharge: 2014-07-13 | Disposition: A | Discharge status: Home / Self Care | Payer: Medicare Other | Source: Ambulatory Visit

## 2014-07-13 DIAGNOSIS — Z1231 Encounter for screening mammogram for malignant neoplasm of breast: Secondary | ICD-10-CM

## 2014-07-24 ENCOUNTER — Encounter: Payer: Self-pay | Admitting: Gynecology

## 2014-09-06 ENCOUNTER — Other Ambulatory Visit (HOSPITAL_COMMUNITY)
Admission: RE | Admit: 2014-09-06 | Discharge: 2014-09-06 | Disposition: A | Discharge status: Home / Self Care | Payer: Medicare Other | Source: Ambulatory Visit | Attending: Gynecology | Admitting: Gynecology

## 2014-09-06 ENCOUNTER — Encounter: Payer: Self-pay | Admitting: Gynecology

## 2014-09-06 ENCOUNTER — Ambulatory Visit (INDEPENDENT_AMBULATORY_CARE_PROVIDER_SITE_OTHER): Payer: Medicare Other | Admitting: Gynecology

## 2014-09-06 VITALS — BP 120/76 | Ht 69.0 in | Wt 148.0 lb

## 2014-09-06 DIAGNOSIS — Z124 Encounter for screening for malignant neoplasm of cervix: Secondary | ICD-10-CM | POA: Insufficient documentation

## 2014-09-06 DIAGNOSIS — R87810 Cervical high risk human papillomavirus (HPV) DNA test positive: Secondary | ICD-10-CM | POA: Insufficient documentation

## 2014-09-06 DIAGNOSIS — R896 Abnormal cytological findings in specimens from other organs, systems and tissues: Secondary | ICD-10-CM

## 2014-09-06 DIAGNOSIS — IMO0002 Reserved for concepts with insufficient information to code with codable children: Secondary | ICD-10-CM

## 2014-09-06 DIAGNOSIS — N952 Postmenopausal atrophic vaginitis: Secondary | ICD-10-CM

## 2014-09-06 DIAGNOSIS — Z1151 Encounter for screening for human papillomavirus (HPV): Secondary | ICD-10-CM | POA: Insufficient documentation

## 2014-09-06 DIAGNOSIS — M8580 Other specified disorders of bone density and structure, unspecified site: Secondary | ICD-10-CM

## 2014-09-06 DIAGNOSIS — C50912 Malignant neoplasm of unspecified site of left female breast: Secondary | ICD-10-CM

## 2014-09-06 NOTE — Patient Instructions (Signed)
The office will call you with your Pap smear results. Call in one week if you have not heard.  You may obtain a copy of any labs that were done today by logging onto MyChart as outlined in the instructions provided with your AVS (after visit summary). The office will not call with normal lab results but certainly if there are any significant abnormalities then we will contact you.   Health Maintenance, Female A healthy lifestyle and preventative care can promote health and wellness.  Maintain regular health, dental, and eye exams.  Eat a healthy diet. Foods like vegetables, fruits, whole grains, low-fat dairy products, and lean protein foods contain the nutrients you need without too many calories. Decrease your intake of foods high in solid fats, added sugars, and salt. Get information about a proper diet from your caregiver, if necessary.  Regular physical exercise is one of the most important things you can do for your health. Most adults should get at least 150 minutes of moderate-intensity exercise (any activity that increases your heart rate and causes you to sweat) each week. In addition, most adults need muscle-strengthening exercises on 2 or more days a week.   Maintain a healthy weight. The body mass index (BMI) is a screening tool to identify possible weight problems. It provides an estimate of body fat based on height and weight. Your caregiver can help determine your BMI, and can help you achieve or maintain a healthy weight. For adults 20 years and older:  A BMI below 18.5 is considered underweight.  A BMI of 18.5 to 24.9 is normal.  A BMI of 25 to 29.9 is considered overweight.  A BMI of 30 and above is considered obese.  Maintain normal blood lipids and cholesterol by exercising and minimizing your intake of saturated fat. Eat a balanced diet with plenty of fruits and vegetables. Blood tests for lipids and cholesterol should begin at age 12 and be repeated every 5 years. If  your lipid or cholesterol levels are high, you are over 50, or you are a high risk for heart disease, you may need your cholesterol levels checked more frequently.Ongoing high lipid and cholesterol levels should be treated with medicines if diet and exercise are not effective.  If you smoke, find out from your caregiver how to quit. If you do not use tobacco, do not start.  Lung cancer screening is recommended for adults aged 93 80 years who are at high risk for developing lung cancer because of a history of smoking. Yearly low-dose computed tomography (CT) is recommended for people who have at least a 30-pack-year history of smoking and are a current smoker or have quit within the past 15 years. A pack year of smoking is smoking an average of 1 pack of cigarettes a day for 1 year (for example: 1 pack a day for 30 years or 2 packs a day for 15 years). Yearly screening should continue until the smoker has stopped smoking for at least 15 years. Yearly screening should also be stopped for people who develop a health problem that would prevent them from having lung cancer treatment.  If you are pregnant, do not drink alcohol. If you are breastfeeding, be very cautious about drinking alcohol. If you are not pregnant and choose to drink alcohol, do not exceed 1 drink per day. One drink is considered to be 12 ounces (355 mL) of beer, 5 ounces (148 mL) of wine, or 1.5 ounces (44 mL) of liquor.  Avoid use  of street drugs. Do not share needles with anyone. Ask for help if you need support or instructions about stopping the use of drugs.  High blood pressure causes heart disease and increases the risk of stroke. Blood pressure should be checked at least every 1 to 2 years. Ongoing high blood pressure should be treated with medicines, if weight loss and exercise are not effective.  If you are 53 to 68 years old, ask your caregiver if you should take aspirin to prevent strokes.  Diabetes screening involves taking  a blood sample to check your fasting blood sugar level. This should be done once every 3 years, after age 73, if you are within normal weight and without risk factors for diabetes. Testing should be considered at a younger age or be carried out more frequently if you are overweight and have at least 1 risk factor for diabetes.  Breast cancer screening is essential preventative care for women. You should practice "breast self-awareness." This means understanding the normal appearance and feel of your breasts and may include breast self-examination. Any changes detected, no matter how small, should be reported to a caregiver. Women in their 29s and 30s should have a clinical breast exam (CBE) by a caregiver as part of a regular health exam every 1 to 3 years. After age 21, women should have a CBE every year. Starting at age 52, women should consider having a mammogram (breast X-ray) every year. Women who have a family history of breast cancer should talk to their caregiver about genetic screening. Women at a high risk of breast cancer should talk to their caregiver about having an MRI and a mammogram every year.  Breast cancer gene (BRCA)-related cancer risk assessment is recommended for women who have family members with BRCA-related cancers. BRCA-related cancers include breast, ovarian, tubal, and peritoneal cancers. Having family members with these cancers may be associated with an increased risk for harmful changes (mutations) in the breast cancer genes BRCA1 and BRCA2. Results of the assessment will determine the need for genetic counseling and BRCA1 and BRCA2 testing.  The Pap test is a screening test for cervical cancer. Women should have a Pap test starting at age 82. Between ages 3 and 16, Pap tests should be repeated every 2 years. Beginning at age 46, you should have a Pap test every 3 years as long as the past 3 Pap tests have been normal. If you had a hysterectomy for a problem that was not cancer  or a condition that could lead to cancer, then you no longer need Pap tests. If you are between ages 70 and 57, and you have had normal Pap tests going back 10 years, you no longer need Pap tests. If you have had past treatment for cervical cancer or a condition that could lead to cancer, you need Pap tests and screening for cancer for at least 20 years after your treatment. If Pap tests have been discontinued, risk factors (such as a new sexual partner) need to be reassessed to determine if screening should be resumed. Some women have medical problems that increase the chance of getting cervical cancer. In these cases, your caregiver may recommend more frequent screening and Pap tests.  The human papillomavirus (HPV) test is an additional test that may be used for cervical cancer screening. The HPV test looks for the virus that can cause the cell changes on the cervix. The cells collected during the Pap test can be tested for HPV. The HPV test  could be used to screen women aged 15 years and older, and should be used in women of any age who have unclear Pap test results. After the age of 56, women should have HPV testing at the same frequency as a Pap test.  Colorectal cancer can be detected and often prevented. Most routine colorectal cancer screening begins at the age of 50 and continues through age 78. However, your caregiver may recommend screening at an earlier age if you have risk factors for colon cancer. On a yearly basis, your caregiver may provide home test kits to check for hidden blood in the stool. Use of a small camera at the end of a tube, to directly examine the colon (sigmoidoscopy or colonoscopy), can detect the earliest forms of colorectal cancer. Talk to your caregiver about this at age 17, when routine screening begins. Direct examination of the colon should be repeated every 5 to 10 years through age 9, unless early forms of pre-cancerous polyps or small growths are found.  Hepatitis C  blood testing is recommended for all people born from [birth year] through 1964 and any individual with known risks for hepatitis C.  Practice safe sex. Use condoms and avoid high-risk sexual practices to reduce the spread of sexually transmitted infections (STIs). Sexually active women aged 26 and younger should be checked for Chlamydia, which is a common sexually transmitted infection. Older women with new or multiple partners should also be tested for Chlamydia. Testing for other STIs is recommended if you are sexually active and at increased risk.  Osteoporosis is a disease in which the bones lose minerals and strength with aging. This can result in serious bone fractures. The risk of osteoporosis can be identified using a bone density scan. Women ages 44 and over and women at risk for fractures or osteoporosis should discuss screening with their caregivers. Ask your caregiver whether you should be taking a calcium supplement or vitamin D to reduce the rate of osteoporosis.  Menopause can be associated with physical symptoms and risks. Hormone replacement therapy is available to decrease symptoms and risks. You should talk to your caregiver about whether hormone replacement therapy is right for you.  Use sunscreen. Apply sunscreen liberally and repeatedly throughout the day. You should seek shade when your shadow is shorter than you. Protect yourself by wearing long sleeves, pants, a wide-brimmed hat, and sunglasses year round, whenever you are outdoors.  Notify your caregiver of new moles or changes in moles, especially if there is a change in shape or color. Also notify your caregiver if a mole is larger than the size of a pencil eraser.  Stay current with your immunizations. Document Released: 03/24/2011 Document Revised: 01/03/2013 Document Reviewed: 03/24/2011 [facility] Patient Information 2014 [place].

## 2014-09-06 NOTE — Addendum Note (Signed)
Addended by: Nelva Nay on: 09/06/2014 09:55 AM   Modules accepted: Orders

## 2014-09-06 NOTE — Progress Notes (Signed)
LYBERTI THRUSH 1946/07/09 [id number]        68 y.o.  G1P1001 for follow up Pap smear with history of LGSIL positive high-risk HPV.  Several other issues noted below.  Past medical history,surgical history, problem list, medications, allergies, family history and social history were all reviewed and documented as reviewed in the EPIC chart.  ROS:  12 system ROS performed with pertinent positives and negatives included in the history, assessment and plan.   Additional significant findings :   none   Exam: [name] Vitals:   09/06/14 0920  BP: 120/76  Height: 5\' 9"  (1.753 m)  Weight: 148 lb (67.132 kg)   General appearance:  Normal affect, orientation and appearance. Skin: Grossly normal HEENT: Without gross lesions.  No cervical or supraclavicular adenopathy. Thyroid normal.  Lungs:  Clear without wheezing, rales or rhonchi Cardiac: RR, without RMG Abdominal:  Soft, nontender, without masses, guarding, rebound, organomegaly or hernia Breasts:  Examined lying and sitting without masses, retractions, discharge or axillary adenopathy.  Status post left lumpectomy. Pelvic:  Ext/BUS/vagina with generalized atrophic changes  Cervix scarred flush with upper vagina no clear cervical os noted.  Uterus anteverted, normal size, shape and contour, midline and mobile nontender   Adnexa  Without masses or tenderness    Anus and perineum  Normal   Rectovaginal  Normal sphincter tone without palpated masses or tenderness.    Assessment/Plan:  69 y.o. G69P1001 female for annual exam.   1. 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  History of  her first abnormal Pap smear October 2013 showing low-grade SIL with positive high risk HPV. 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  Followup Pap smear 03/2013 showed LGSIL 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, negative subtype 16/18/45. Colposcopy with ECC showed a fragment LGSIL  epithelium. She saw Dr. Marti Sleigh in consultation 04/2013 who recommended continual expectant management with Pap smear at 6 month interval.  Last Pap smears 08/2013, 02/2014 showed LGSIL 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.  Pap smear/HPV done today.  Patient will follow for results. 2. Osteopenia. DEXA 08/2012 T score -2 FRAX 7.6%/0.8%. Stable from prior DEXA. Repeat DEXA now at 2 year interval. Increased calcium vitamin D reviewed. 3. History of breast cancer, left. Exam NED.  Mammography 06/2014. Continue with annual mammography.  SBE monthly reviewed. 4. Colonoscopy 2012. Repeat at their recommended interval. 5. Postmenopausal/atrophic genital changes. Without significant hot flushes, night sweats, vaginal dryness 4 dyspareunia. No vaginal bleeding. Continue to monitor. Report any vaginal bleeding. 6. Health maintenance. No routine blood work done as this is done at her primary physician's office. Follow up 1 year, sooner as needed.      [name] MD, 9:46 AM 09/06/2014

## 2014-09-07 LAB — URINALYSIS W MICROSCOPIC + REFLEX CULTURE
Bacteria, UA: NONE SEEN
Bilirubin Urine: NEGATIVE
Casts: NONE SEEN
Crystals: NONE SEEN
Glucose, UA: NEGATIVE mg/dL
Hgb urine dipstick: NEGATIVE
Ketones, ur: NEGATIVE mg/dL
Leukocytes, UA: NEGATIVE
Nitrite: NEGATIVE
Protein, ur: NEGATIVE mg/dL
Specific Gravity, Urine: 1.013 (ref 1.005–1.030)
Squamous Epithelial / LPF: NONE SEEN
Urobilinogen, UA: 0.2 mg/dL (ref 0.0–1.0)
pH: 7 (ref 5.0–8.0)

## 2014-09-07 LAB — CYTOLOGY - PAP

## 2014-09-11 ENCOUNTER — Other Ambulatory Visit: Payer: Self-pay | Admitting: Gynecology

## 2014-09-11 ENCOUNTER — Encounter: Payer: Self-pay | Admitting: Gynecology

## 2014-09-22 DIAGNOSIS — M8580 Other specified disorders of bone density and structure, unspecified site: Secondary | ICD-10-CM

## 2014-09-22 HISTORY — DX: Other specified disorders of bone density and structure, unspecified site: M85.80

## 2014-09-26 ENCOUNTER — Ambulatory Visit (INDEPENDENT_AMBULATORY_CARE_PROVIDER_SITE_OTHER): Payer: Medicare Other

## 2014-09-26 DIAGNOSIS — M8588 Other specified disorders of bone density and structure, other site: Secondary | ICD-10-CM

## 2014-09-26 DIAGNOSIS — M8580 Other specified disorders of bone density and structure, unspecified site: Secondary | ICD-10-CM

## 2014-09-27 ENCOUNTER — Encounter: Payer: Self-pay | Admitting: Gynecology

## 2015-03-08 ENCOUNTER — Other Ambulatory Visit (HOSPITAL_COMMUNITY)
Admission: RE | Admit: 2015-03-08 | Discharge: 2015-03-08 | Disposition: A | Discharge status: Home / Self Care | Payer: Medicare Other | Source: Ambulatory Visit | Attending: Gynecology | Admitting: Gynecology

## 2015-03-08 ENCOUNTER — Encounter: Payer: Self-pay | Admitting: Gynecology

## 2015-03-08 ENCOUNTER — Ambulatory Visit (INDEPENDENT_AMBULATORY_CARE_PROVIDER_SITE_OTHER): Payer: Medicare Other | Admitting: Gynecology

## 2015-03-08 VITALS — BP 120/76

## 2015-03-08 DIAGNOSIS — Z01411 Encounter for gynecological examination (general) (routine) with abnormal findings: Secondary | ICD-10-CM | POA: Diagnosis present

## 2015-03-08 DIAGNOSIS — R896 Abnormal cytological findings in specimens from other organs, systems and tissues: Secondary | ICD-10-CM | POA: Diagnosis not present

## 2015-03-08 DIAGNOSIS — R87810 Cervical high risk human papillomavirus (HPV) DNA test positive: Secondary | ICD-10-CM | POA: Insufficient documentation

## 2015-03-08 DIAGNOSIS — IMO0002 Reserved for concepts with insufficient information to code with codable children: Secondary | ICD-10-CM

## 2015-03-08 DIAGNOSIS — Z1151 Encounter for screening for human papillomavirus (HPV): Secondary | ICD-10-CM | POA: Diagnosis present

## 2015-03-08 NOTE — Patient Instructions (Signed)
Follow-up for Pap smear results. 

## 2015-03-08 NOTE — Progress Notes (Signed)
Christina Edwards 12-07-1945 [id number]        69 y.o.  G1P1001 presents for Pap smear.  History of  her first abnormal Pap smear October 2013 showing low-grade SIL with positive high risk HPV. 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  Followup Pap smear 03/2013 showed LGSIL 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, negative subtype 16/18/45. Colposcopy with ECC showed a fragment LGSIL epithelium. She saw Dr. Marti Sleigh in consultation 04/2013 who recommended continual expectant management with Pap smear at 6 month interval.  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  Past medical history,surgical history, problem list, medications, allergies, family history and social history were all reviewed and documented in the EPIC chart.  Directed ROS with pertinent positives and negatives documented in the history of present illness/assessment and plan.  Exam: Kim assistant Filed Vitals:   03/08/15 0818  BP: 120/76   General appearance:  Normal External BUS vagina with atrophic changes.  Cervix flush with upper vagina dimple stenotic os. Pap/HPV.  Uterus small midline mobile nontender. Adnexa without masses or tenderness  Assessment/Plan:  69 y.o. G1P1001 with above history. Pap smear/HPV done. Patient will follow for results and triage based on these results.    [name] MD, 8:33 AM 03/08/2015

## 2015-03-08 NOTE — Addendum Note (Signed)
Addended by: Nelva Nay on: 03/08/2015 08:40 AM   Modules accepted: Orders

## 2015-03-09 LAB — CYTOLOGY - PAP

## 2015-03-14 ENCOUNTER — Encounter: Payer: Self-pay | Admitting: Gynecology

## 2015-06-18 ENCOUNTER — Other Ambulatory Visit: Payer: Self-pay

## 2015-06-18 DIAGNOSIS — Z1231 Encounter for screening mammogram for malignant neoplasm of breast: Secondary | ICD-10-CM

## 2015-07-23 ENCOUNTER — Ambulatory Visit
Admission: RE | Admit: 2015-07-23 | Discharge: 2015-07-23 | Disposition: A | Discharge status: Home / Self Care | Payer: Medicare Other | Source: Ambulatory Visit

## 2015-07-23 DIAGNOSIS — Z1231 Encounter for screening mammogram for malignant neoplasm of breast: Secondary | ICD-10-CM

## 2015-09-10 ENCOUNTER — Encounter: Payer: Self-pay | Admitting: Gynecology

## 2015-09-10 ENCOUNTER — Ambulatory Visit (INDEPENDENT_AMBULATORY_CARE_PROVIDER_SITE_OTHER): Payer: Medicare Other | Admitting: Gynecology

## 2015-09-10 ENCOUNTER — Other Ambulatory Visit (HOSPITAL_COMMUNITY)
Admission: RE | Admit: 2015-09-10 | Discharge: 2015-09-10 | Disposition: A | Discharge status: Home / Self Care | Payer: Medicare Other | Source: Ambulatory Visit | Attending: Gynecology | Admitting: Gynecology

## 2015-09-10 VITALS — BP 112/64 | Ht 70.0 in | Wt 142.0 lb

## 2015-09-10 DIAGNOSIS — IMO0002 Reserved for concepts with insufficient information to code with codable children: Secondary | ICD-10-CM

## 2015-09-10 DIAGNOSIS — R896 Abnormal cytological findings in specimens from other organs, systems and tissues: Secondary | ICD-10-CM

## 2015-09-10 DIAGNOSIS — Z01411 Encounter for gynecological examination (general) (routine) with abnormal findings: Secondary | ICD-10-CM | POA: Diagnosis present

## 2015-09-10 DIAGNOSIS — R87810 Cervical high risk human papillomavirus (HPV) DNA test positive: Secondary | ICD-10-CM

## 2015-09-10 DIAGNOSIS — Z1151 Encounter for screening for human papillomavirus (HPV): Secondary | ICD-10-CM | POA: Insufficient documentation

## 2015-09-10 DIAGNOSIS — Z01419 Encounter for gynecological examination (general) (routine) without abnormal findings: Secondary | ICD-10-CM | POA: Diagnosis not present

## 2015-09-10 DIAGNOSIS — N952 Postmenopausal atrophic vaginitis: Secondary | ICD-10-CM | POA: Diagnosis not present

## 2015-09-10 DIAGNOSIS — C50912 Malignant neoplasm of unspecified site of left female breast: Secondary | ICD-10-CM | POA: Diagnosis not present

## 2015-09-10 NOTE — Addendum Note (Signed)
Addended by: Nelva Nay on: 09/10/2015 11:21 AM   Modules accepted: Orders

## 2015-09-10 NOTE — Progress Notes (Signed)
Christina Edwards [birth year] [id number]        69 y.o.  G1P1001  for breast and pelvic exam  Past medical history,surgical history, problem list, medications, allergies, family history and social history were all reviewed and documented as reviewed in the EPIC chart.  ROS:  Performed with pertinent positives and negatives included in the history, assessment and plan.   Additional significant findings :  none   Exam: Christina Edwards Vitals:   09/10/15 1043  BP: 112/64  Height: 5\' 10"  (1.778 m)  Weight: 142 lb (64.411 kg)   General appearance:  Normal affect, orientation and appearance. Skin: Grossly normal HEENT: Without gross lesions.  No cervical or supraclavicular adenopathy. Thyroid normal.  Lungs:  Clear without wheezing, rales or rhonchi Cardiac: RR, without RMG Abdominal:  Soft, nontender, without masses, guarding, rebound, organomegaly or hernia Breasts:  Examined lying and sitting without masses, retractions, discharge or axillary adenopathy.  Well-healed left lumpectomy scar Pelvic:  Ext/BUS/vagina with atrophic changes  Cervix plus with upper vagina, atrophic. Pap smear/HPV  Uterus axial to anteverted, normal size, shape and contour, midline and mobile nontender   Adnexa  Without masses or tenderness    Anus and perineum  Normal   Rectovaginal  Normal sphincter tone without palpated masses or tenderness.    Assessment/Plan:  69 y.o. G52P1001 female for breast and pelvic exam  1. 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  History of  her first abnormal Pap smear October 2013 showing low-grade SIL with positive high risk HPV. 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  Followup Pap smear 03/2013 showed LGSIL 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, negative subtype 16/18/45. Colposcopy with ECC showed a fragment LGSIL epithelium. She saw Dr. Marti Sleigh in consultation 04/2013 who recommended continual  expectant management with Pap smear at 6 month interval.  Pap smears 08/2013, 02/2014, 08/2014. 02/2015 showed LGSIL 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.  Pap smear/HPV done today.  Will triage based upon results 2. Osteopenia. DEXA 09/2014 T score -1.7 FRAX 11%/2.3%. Stable from prior DEXA. Repeat at 2 year interval. 3. History of left sided breast cancer. Exam NED. Mammography 06/2015. Continue with annual mammography when due. SBE monthly reviewed. 4. Colonoscopy 2012. Repeat at their recommended interval. 5. Health maintenance. No routine lab work done as patient reports this done at her primary physician's office. Follow up 1 year, sooner as needed.    Christina Auerbach MD, 11:06 AM 09/10/2015

## 2015-09-10 NOTE — Patient Instructions (Signed)
Call for your Pap smear results in one week if you do not hear from [place] sooner.  You may obtain a copy of any labs that were done today by logging onto MyChart as outlined in the instructions provided with your AVS (after visit summary). The office will not call with normal lab results but certainly if there are any significant abnormalities then we will contact you.   Health Maintenance Adopting a healthy lifestyle and getting preventive care can go a long way to promote health and wellness. Talk with your health care provider about what schedule of regular examinations is right for you. This is a good chance for you to check in with your provider about disease prevention and staying healthy. In between checkups, there are plenty of things you can do on your own. Experts have done a lot of research about which lifestyle changes and preventive measures are most likely to keep you healthy. Ask your health care provider for more information. WEIGHT AND DIET  Eat a healthy diet  Be sure to include plenty of vegetables, fruits, low-fat dairy products, and lean protein.  Do not eat a lot of foods high in solid fats, added sugars, or salt.  Get regular exercise. This is one of the most important things you can do for your health.  Most adults should exercise for at least 150 minutes each week. The exercise should increase your heart rate and make you sweat (moderate-intensity exercise).  Most adults should also do strengthening exercises at least twice a week. This is in addition to the moderate-intensity exercise.  Maintain a healthy weight  Body mass index (BMI) is a measurement that can be used to identify possible weight problems. It estimates body fat based on height and weight. Your health care provider can help determine your BMI and help you achieve or maintain a healthy weight.  For females 19 years of age and older:   A BMI below 18.5 is considered underweight.  A BMI of 18.5 to 24.9  is normal.  A BMI of 25 to 29.9 is considered overweight.  A BMI of 30 and above is considered obese.  Watch levels of cholesterol and blood lipids  You should start having your blood tested for lipids and cholesterol at 68 years of age, then have this test every 5 years.  You may need to have your cholesterol levels checked more often if:  Your lipid or cholesterol levels are high.  You are older than 68 years of age.  You are at high risk for heart disease.  CANCER SCREENING   Lung Cancer  Lung cancer screening is recommended for adults 62-73 years old who are at high risk for lung cancer because of a history of smoking.  A yearly low-dose CT scan of the lungs is recommended for people who:  Currently smoke.  Have quit within the past 15 years.  Have at least a 30-pack-year history of smoking. A pack year is smoking an average of one pack of cigarettes a day for 1 year.  Yearly screening should continue until it has been 15 years since you quit.  Yearly screening should stop if you develop a health problem that would prevent you from having lung cancer treatment.  Breast Cancer  Practice breast self-awareness. This means understanding how your breasts normally appear and feel.  It also means doing regular breast self-exams. Let your health care provider know about any changes, no matter how small.  If you are in your  or 30s, you should have a clinical breast exam (CBE) by a health care provider every 1-3 years as part of a regular health exam.  If you are 40 or older, have a CBE every year. Also consider having a breast X-ray (mammogram) every year.  If you have a family history of breast cancer, talk to your health care provider about genetic screening.  If you are at high risk for breast cancer, talk to your health care provider about having an MRI and a mammogram every year.  Breast cancer gene (BRCA) assessment is recommended for women who have family members  with BRCA-related cancers. BRCA-related cancers include:  Breast.  Ovarian.  Tubal.  Peritoneal cancers.  Results of the assessment will determine the need for genetic counseling and BRCA1 and BRCA2 testing. Cervical Cancer Routine pelvic examinations to screen for cervical cancer are no longer recommended for nonpregnant women who are considered low risk for cancer of the pelvic organs (ovaries, uterus, and vagina) and who do not have symptoms. A pelvic examination may be necessary if you have symptoms including those associated with pelvic infections. Ask your health care provider if a screening pelvic exam is right for you.   The Pap test is the screening test for cervical cancer for women who are considered at risk.  If you had a hysterectomy for a problem that was not cancer or a condition that could lead to cancer, then you no longer need Pap tests.  If you are older than 65 years, and you have had normal Pap tests for the past 10 years, you no longer need to have Pap tests.  If you have had past treatment for cervical cancer or a condition that could lead to cancer, you need Pap tests and screening for cancer for at least 20 years after your treatment.  If you no longer get a Pap test, assess your risk factors if they change (such as having a new sexual partner). This can affect whether you should start being screened again.  Some women have medical problems that increase their chance of getting cervical cancer. If this is the case for you, your health care provider may recommend more frequent screening and Pap tests.  The human papillomavirus (HPV) test is another test that may be used for cervical cancer screening. The HPV test looks for the virus that can cause cell changes in the cervix. The cells collected during the Pap test can be tested for HPV.  The HPV test can be used to screen women 30 years of age and older. Getting tested for HPV can extend the interval between normal  Pap tests from three to five years.  An HPV test also should be used to screen women of any age who have unclear Pap test results.  After 68 years of age, women should have HPV testing as often as Pap tests.  Colorectal Cancer  This type of cancer can be detected and often prevented.  Routine colorectal cancer screening usually begins at 68 years of age and continues through 68 years of age.  Your health care provider may recommend screening at an earlier age if you have risk factors for colon cancer.  Your health care provider may also recommend using home test kits to check for hidden blood in the stool.  A small camera at the end of a tube can be used to examine your colon directly (sigmoidoscopy or colonoscopy). This is done to check for the earliest forms of colorectal cancer.    Routine screening usually begins at age 50.  Direct examination of the colon should be repeated every 5-10 years through 68 years of age. However, you may need to be screened more often if early forms of precancerous polyps or small growths are found. Skin Cancer  Check your skin from head to toe regularly.  Tell your health care provider about any new moles or changes in moles, especially if there is a change in a mole's shape or color.  Also tell your health care provider if you have a mole that is larger than the size of a pencil eraser.  Always use sunscreen. Apply sunscreen liberally and repeatedly throughout the day.  Protect yourself by wearing long sleeves, pants, a wide-brimmed hat, and sunglasses whenever you are outside. HEART DISEASE, DIABETES, AND HIGH BLOOD PRESSURE   Have your blood pressure checked at least every 1-2 years. High blood pressure causes heart disease and increases the risk of stroke.  If you are between 55 years and 79 years old, ask your health care provider if you should take aspirin to prevent strokes.  Have regular diabetes screenings. This involves taking a blood  sample to check your fasting blood sugar level.  If you are at a normal weight and have a low risk for diabetes, have this test once every three years after 68 years of age.  If you are overweight and have a high risk for diabetes, consider being tested at a younger age or more often. PREVENTING INFECTION  Hepatitis B  If you have a higher risk for hepatitis B, you should be screened for this virus. You are considered at high risk for hepatitis B if:  You were born in a country where hepatitis B is common. Ask your health care provider which countries are considered high risk.  Your parents were born in a high-risk country, and you have not been immunized against hepatitis B (hepatitis B vaccine).  You have HIV or AIDS.  You use needles to inject street drugs.  You live with someone who has hepatitis B.  You have had sex with someone who has hepatitis B.  You get hemodialysis treatment.  You take certain medicines for conditions, including cancer, organ transplantation, and autoimmune conditions. Hepatitis C  Blood testing is recommended for:  Everyone born from 1945 through 1965.  Anyone with known risk factors for hepatitis C. Sexually transmitted infections (STIs)  You should be screened for sexually transmitted infections (STIs) including gonorrhea and chlamydia if:  You are sexually active and are younger than 68 years of age.  You are older than 68 years of age and your health care provider tells you that you are at risk for this type of infection.  Your sexual activity has changed since you were last screened and you are at an increased risk for chlamydia or gonorrhea. Ask your health care provider if you are at risk.  If you do not have HIV, but are at risk, it may be recommended that you take a prescription medicine daily to prevent HIV infection. This is called pre-exposure prophylaxis (PrEP). You are considered at risk if:  You are sexually active and do not  regularly use condoms or know the HIV status of your partner(s).  You take drugs by injection.  You are sexually active with a partner who has HIV. Talk with your health care provider about whether you are at high risk of being infected with HIV. If you choose to begin PrEP, you should first   be tested for HIV. You should then be tested every 3 months for as long as you are taking PrEP.  PREGNANCY   If you are premenopausal and you may become pregnant, ask your health care provider about preconception counseling.  If you may become pregnant, take 400 to 800 micrograms (mcg) of folic acid every day.  If you want to prevent pregnancy, talk to your health care provider about birth control (contraception). OSTEOPOROSIS AND MENOPAUSE   Osteoporosis is a disease in which the bones lose minerals and strength with aging. This can result in serious bone fractures. Your risk for osteoporosis can be identified using a bone density scan.  If you are 65 years of age or older, or if you are at risk for osteoporosis and fractures, ask your health care provider if you should be screened.  Ask your health care provider whether you should take a calcium or vitamin D supplement to lower your risk for osteoporosis.  Menopause may have certain physical symptoms and risks.  Hormone replacement therapy may reduce some of these symptoms and risks. Talk to your health care provider about whether hormone replacement therapy is right for you.  HOME CARE INSTRUCTIONS   Schedule regular health, dental, and eye exams.  Stay current with your immunizations.   Do not use any tobacco products including cigarettes, chewing tobacco, or electronic cigarettes.  If you are pregnant, do not drink alcohol.  If you are breastfeeding, limit how much and how often you drink alcohol.  Limit alcohol intake to no more than 1 drink per day for nonpregnant women. One drink equals 12 ounces of beer, 5 ounces of wine, or 1  ounces of hard liquor.  Do not use street drugs.  Do not share needles.  Ask your health care provider for help if you need support or information about quitting drugs.  Tell your health care provider if you often feel depressed.  Tell your health care provider if you have ever been abused or do not feel safe at home. Document Released: 03/24/2011 Document Revised: 01/23/2014 Document Reviewed: 08/10/2013 Exit[employer] Patient Information 2015 [employer]. This information is not intended to replace advice given to you by your health care provider. Make sure you discuss any questions you have with your health care provider.  

## 2015-09-12 LAB — CYTOLOGY - PAP

## 2015-09-13 ENCOUNTER — Encounter: Payer: Self-pay | Admitting: Gynecology

## 2015-09-23 DIAGNOSIS — R87612 Low grade squamous intraepithelial lesion on cytologic smear of cervix (LGSIL): Secondary | ICD-10-CM

## 2015-09-23 HISTORY — DX: Low grade squamous intraepithelial lesion on cytologic smear of cervix (LGSIL): R87.612

## 2015-09-25 ENCOUNTER — Ambulatory Visit (INDEPENDENT_AMBULATORY_CARE_PROVIDER_SITE_OTHER): Payer: Medicare Other | Admitting: Gynecology

## 2015-09-25 ENCOUNTER — Encounter: Payer: Self-pay | Admitting: Gynecology

## 2015-09-25 VITALS — BP 116/76

## 2015-09-25 DIAGNOSIS — R896 Abnormal cytological findings in specimens from other organs, systems and tissues: Secondary | ICD-10-CM

## 2015-09-25 DIAGNOSIS — IMO0002 Reserved for concepts with insufficient information to code with codable children: Secondary | ICD-10-CM

## 2015-09-25 DIAGNOSIS — R87810 Cervical high risk human papillomavirus (HPV) DNA test positive: Secondary | ICD-10-CM

## 2015-09-25 NOTE — Progress Notes (Signed)
Christina Edwards 02/03/1946 [id number]        70 y.o.  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 Follow up Pap smears have all shown LGSIL 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 to include her most recent Pap smear 08/2015. She saw Dr. Marti Sleigh in consultation who recommended continuing every 6 month Pap smears as long as they remain low-grade.  Past medical history,surgical history, problem list, medications, allergies, family history and social history were all reviewed and documented in the EPIC chart.  Directed ROS with pertinent positives and negatives documented in the history of present illness/assessment and plan.  Exam: [name] assistant Filed Vitals:   09/25/15 1148  BP: 116/76   General appearance:  Normal External BUS vagina with atrophic changes. Cervix flush with the upper vagina. Uterus grossly normal size midline mobile nontender. Adnexa without masses or tenderness.  Colposcopy performed after acetic acid cleanse. No abnormalities noted but no transformation zone seen. The anterior lip of the cervix was grasped with a single-tooth tenaculum and the cervix was gently dilated with a disposable Green dilator to allow for endocervical curetting. Specimen sent to pathology. Patient tolerated well.  Assessment/Plan:  70 y.o. G1P1001 with history as above. Will follow up for pathology results. 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 Possible treatments to include repeat LEEP up to including hysterectomy. Will rediscuss after pathology results.    [name] MD, 12:10 PM 09/25/2015

## 2015-09-25 NOTE — Patient Instructions (Signed)
Office will call you with biopsy results 

## 2015-09-27 ENCOUNTER — Encounter: Payer: Self-pay | Admitting: Gynecology

## 2016-02-27 ENCOUNTER — Encounter: Payer: Self-pay | Admitting: Podiatry

## 2016-02-27 ENCOUNTER — Ambulatory Visit (INDEPENDENT_AMBULATORY_CARE_PROVIDER_SITE_OTHER): Payer: Medicare Other | Admitting: Podiatry

## 2016-02-27 VITALS — BP 141/73 | HR 66 | Resp 14

## 2016-02-27 DIAGNOSIS — Q828 Other specified congenital malformations of skin: Secondary | ICD-10-CM

## 2016-02-27 NOTE — Progress Notes (Addendum)
   Subjective:    Patient ID: Christina Edwards, female    DOB: Aug 26, 1946, 70 y.o.   MRN: [id number]  HPI this patient presents to the office with chief complaint of painful callus, right foot. She says the callus is painful walking and wearing her shoes. She has provided no self treatment nor sought any professional help. She presents the office for an evaluation and treatment of these painful callus right forefoot    Review of Systems  All other systems reviewed and are negative.      Objective:   Physical Exam GENERAL APPEARANCE: Alert, conversant. Appropriately groomed. No acute distress.  VASCULAR: Pedal pulses are  palpable at  [facility] and PT bilateral.  Capillary refill time is immediate to all digits,  Normal temperature gradient.  Digital hair growth is present bilateral  NEUROLOGIC: sensation is normal to 5.07 monofilament at 5/5 sites bilateral.  Light touch is intact bilateral, Muscle strength normal.  MUSCULOSKELETAL: acceptable muscle strength, tone and stability bilateral.  Intrinsic muscluature intact bilateral.  Rectus appearance of foot and digits noted bilateral.   DERMATOLOGIC: skin color, texture, and turgor are within normal limits.  No preulcerative lesions or ulcers  are seen, no interdigital maceration noted.  No open lesions present.  Digital nails are asymptomatic. No drainage noted. Porokeratosis right forefoot.         Assessment & Plan:  Porokeratosis right forefoot.    IE  Debride porokeratosis right forefoot.  RTC prn   Gardiner Barefoot DPM

## 2016-06-03 ENCOUNTER — Other Ambulatory Visit: Payer: Self-pay | Admitting: Gynecology

## 2016-06-03 DIAGNOSIS — Z1231 Encounter for screening mammogram for malignant neoplasm of breast: Secondary | ICD-10-CM

## 2016-07-24 ENCOUNTER — Ambulatory Visit: Payer: Medicare Other

## 2016-08-25 ENCOUNTER — Ambulatory Visit
Admission: RE | Admit: 2016-08-25 | Discharge: 2016-08-25 | Disposition: A | Discharge status: Home / Self Care | Payer: Medicare Other | Source: Ambulatory Visit | Attending: Gynecology | Admitting: Gynecology

## 2016-08-25 DIAGNOSIS — Z1231 Encounter for screening mammogram for malignant neoplasm of breast: Secondary | ICD-10-CM

## 2016-09-10 ENCOUNTER — Ambulatory Visit (INDEPENDENT_AMBULATORY_CARE_PROVIDER_SITE_OTHER): Payer: Medicare Other | Admitting: Gynecology

## 2016-09-10 ENCOUNTER — Encounter: Payer: Self-pay | Admitting: Gynecology

## 2016-09-10 VITALS — BP 116/74 | Ht 69.0 in | Wt 145.0 lb

## 2016-09-10 DIAGNOSIS — Z01411 Encounter for gynecological examination (general) (routine) with abnormal findings: Secondary | ICD-10-CM

## 2016-09-10 DIAGNOSIS — M8580 Other specified disorders of bone density and structure, unspecified site: Secondary | ICD-10-CM | POA: Diagnosis not present

## 2016-09-10 DIAGNOSIS — C50912 Malignant neoplasm of unspecified site of left female breast: Secondary | ICD-10-CM

## 2016-09-10 DIAGNOSIS — Z1151 Encounter for screening for human papillomavirus (HPV): Secondary | ICD-10-CM

## 2016-09-10 DIAGNOSIS — N952 Postmenopausal atrophic vaginitis: Secondary | ICD-10-CM

## 2016-09-10 NOTE — Patient Instructions (Addendum)
Follow up for your bone density as scheduled.  Office will contact you with your Pap smear results. Call the office if you do not hear within the next 2 weeks.

## 2016-09-10 NOTE — Addendum Note (Signed)
Addended by: Nelva Nay on: 09/10/2016 12:18 PM   Modules accepted: Orders

## 2016-09-10 NOTE — Progress Notes (Signed)
    Christina Edwards 09-Feb-1946 [id number]        70 y.o.  G1P1001 for annual exam.   Past medical history,surgical history, problem list, medications, allergies, family history and social history were all reviewed and documented as reviewed in the EPIC chart.  ROS:  Performed with pertinent positives and negatives included in the history, assessment and plan.   Additional significant findings :  None   Exam: [name] assistant Vitals:   09/10/16 1009  BP: 116/74  Weight: 145 lb (65.8 kg)  Height: 5\' 9"  (1.753 m)   Body mass index is 21.41 kg/m.  General appearance:  Normal affect, orientation and appearance. Skin: Grossly normal HEENT: Without gross lesions.  No cervical or supraclavicular adenopathy. Thyroid normal.  Lungs:  Clear without wheezing, rales or rhonchi Cardiac: RR, without RMG Abdominal:  Soft, nontender, without masses, guarding, rebound, organomegaly or hernia Breasts:  Examined lying and sitting without masses, retractions, discharge or axillary adenopathy.Well-healed left lumpectomy scar. Pelvic:  Ext, BUS, Vagina with atrophic changes  Cervix with atrophic changes. Pap smear/HPV  Uterus anteverted, normal size, shape and contour, midline and mobile nontender   Adnexa without masses or tenderness    Anus and perineum normal   Rectovaginal normal sphincter tone without palpated masses or tenderness.    Assessment/Plan:  70 y.o. G69P1001 female for annual exam.   1. Postmenopausal/atrophic genital changes. No significant hot flushes, night sweats, vaginal dryness or any vaginal bleeding. Continue to monitor report any issues or vaginal bleeding. 2. 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 Saw Dr. Marti Sleigh in consultation 2014 recommended expectant management. Full documentation of my 09/10/2015 note. Last Pap smear 08/2015 LGSIL 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. Follow up colposcopy 09/2015 normal but inadequate. ECC was negative. Pap  smear/HPV done today. 3. Osteopenia. DEXA 09/2014 T score -1.7. FRAX 11%/2.3%. Repeat DEXA now and patient will schedule follow up for that. 4. History of left-sided breast cancer. Exam NED. Mammography 08/2016. Continue with annual mammography. SBE monthly reviewed. 5. Colonoscopy 2012. Repeat at their recommended interval. 6. Health maintenance. No routine lab work done as patient reports is done elsewhere. Follow up for bone density, follow up for Pap smear results, otherwise follow up in 1 year, sooner as needed.   [name] MD, 10:36 AM 09/10/2016

## 2016-09-11 LAB — PAP IG AND HPV HIGH-RISK: HPV DNA High Risk: DETECTED — AB

## 2016-09-12 ENCOUNTER — Encounter: Payer: Self-pay | Admitting: Gynecology

## 2016-11-04 ENCOUNTER — Ambulatory Visit (INDEPENDENT_AMBULATORY_CARE_PROVIDER_SITE_OTHER): Payer: Medicare Other

## 2016-11-04 ENCOUNTER — Encounter: Payer: Self-pay | Admitting: Gynecology

## 2016-11-04 ENCOUNTER — Other Ambulatory Visit: Payer: Self-pay | Admitting: Gynecology

## 2016-11-04 DIAGNOSIS — M8580 Other specified disorders of bone density and structure, unspecified site: Secondary | ICD-10-CM

## 2016-11-04 DIAGNOSIS — M8589 Other specified disorders of bone density and structure, multiple sites: Secondary | ICD-10-CM

## 2016-11-04 DIAGNOSIS — Z780 Asymptomatic menopausal state: Secondary | ICD-10-CM

## 2017-03-11 ENCOUNTER — Encounter: Payer: Self-pay | Admitting: Gynecology

## 2017-03-11 ENCOUNTER — Ambulatory Visit (INDEPENDENT_AMBULATORY_CARE_PROVIDER_SITE_OTHER): Payer: Medicare Other | Admitting: Gynecology

## 2017-03-11 VITALS — BP 118/76

## 2017-03-11 DIAGNOSIS — R87612 Low grade squamous intraepithelial lesion on cytologic smear of cervix (LGSIL): Secondary | ICD-10-CM

## 2017-03-11 DIAGNOSIS — R87810 Cervical high risk human papillomavirus (HPV) DNA test positive: Secondary | ICD-10-CM

## 2017-03-11 NOTE — Patient Instructions (Signed)
Office will follow up with you with the Pap smear results.

## 2017-03-11 NOTE — Progress Notes (Signed)
    Christina Edwards [birth year] [id number]        71 y.o.  G1P1001 presents for Pap smear. 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 Saw Dr. Marti Sleigh in consult 2014 recommended expectant management. Full note documented 09/10/2015. Last colposcopy 09/2015 normal but an adequate. ECC was negative. Pap smear 08/2016 LGSIL 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.  Past medical history,surgical history, problem list, medications, allergies, family history and social history were all reviewed and documented in the EPIC chart.  Directed ROS with pertinent positives and negatives documented in the history of present illness/assessment and plan.  Exam: [name] assistant Vitals:   03/11/17 0848  BP: 118/76   General appearance:  Normal Abdomen soft nontender without masses guarding rebound Pelvic external BUS vagina with atrophic changes. Cervix with atrophic changes. Uterus normal size midline mobile nontender. Adnexa without masses or tenderness.  Assessment/Plan:  71 y.o. 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 Pap smear done today. Patient will follow up for results and triage based on these results. If low-grade then plan expectant management with Pap smear in 6 months. If high-grade then proceed with colposcopy.    [name] MD, 8:56 AM 03/11/2017

## 2017-03-11 NOTE — Addendum Note (Signed)
Addended by: Nelva Nay on: 03/11/2017 09:10 AM   Modules accepted: Orders

## 2017-03-16 LAB — PAP IG W/ RFLX HPV ASCU

## 2017-06-15 ENCOUNTER — Other Ambulatory Visit: Payer: Self-pay | Admitting: Gynecology

## 2017-06-15 DIAGNOSIS — Z1239 Encounter for other screening for malignant neoplasm of breast: Secondary | ICD-10-CM

## 2017-08-26 ENCOUNTER — Ambulatory Visit
Admission: RE | Admit: 2017-08-26 | Discharge: 2017-08-26 | Disposition: A | Discharge status: Home / Self Care | Payer: Medicare Other | Source: Ambulatory Visit | Attending: Gynecology | Admitting: Gynecology

## 2017-08-26 DIAGNOSIS — Z1239 Encounter for other screening for malignant neoplasm of breast: Secondary | ICD-10-CM

## 2017-08-26 HISTORY — DX: Personal history of irradiation: Z92.3

## 2017-09-11 ENCOUNTER — Ambulatory Visit: Payer: Medicare Other | Admitting: Gynecology

## 2017-09-16 ENCOUNTER — Ambulatory Visit (INDEPENDENT_AMBULATORY_CARE_PROVIDER_SITE_OTHER): Payer: Medicare Other | Admitting: Gynecology

## 2017-09-16 ENCOUNTER — Ambulatory Visit: Payer: Medicare Other | Admitting: Gynecology

## 2017-09-16 ENCOUNTER — Encounter: Payer: Self-pay | Admitting: Gynecology

## 2017-09-16 VITALS — BP 128/80 | Ht 70.0 in | Wt 144.0 lb

## 2017-09-16 DIAGNOSIS — R87612 Low grade squamous intraepithelial lesion on cytologic smear of cervix (LGSIL): Secondary | ICD-10-CM

## 2017-09-16 DIAGNOSIS — Z853 Personal history of malignant neoplasm of breast: Secondary | ICD-10-CM | POA: Diagnosis not present

## 2017-09-16 DIAGNOSIS — Z01411 Encounter for gynecological examination (general) (routine) with abnormal findings: Secondary | ICD-10-CM

## 2017-09-16 NOTE — Progress Notes (Addendum)
    Christina Edwards 1946/05/07 [id number]        71 y.o.  G1P1001 for annual gynecologic exam.  Followed for persistent LGSIL positive high risk HPV as noted below.  Past medical history,surgical history, problem list, medications, allergies, family history and social history were all reviewed and documented as reviewed in the EPIC chart.  ROS:  Performed with pertinent positives and negatives included in the history, assessment and plan.   Additional significant findings : None   Exam: Wandra Scot assistant Vitals:   09/16/17 0906  BP: 128/80  Weight: 144 lb (65.3 kg)  Height: 5\' 10"  (1.778 m)   Body mass index is 20.66 kg/m.  General appearance:  Normal affect, orientation and appearance. Skin: Grossly normal HEENT: Without gross lesions.  No cervical or supraclavicular adenopathy. Thyroid normal.  Lungs:  Clear without wheezing, rales or rhonchi Cardiac: RR, without RMG Abdominal:  Soft, nontender, without masses, guarding, rebound, organomegaly or hernia Breasts:  Examined lying and sitting without masses, retractions, discharge or axillary adenopathy.  Well-healed left lumpectomy scar Pelvic:  Ext, BUS, Vagina: With atrophic changes  Cervix: With atrophic changes.  Pap smear/HPV  Uterus: Anteverted, normal size, shape and contour, midline and mobile nontender   Adnexa: Without masses or tenderness    Anus and perineum: Normal   Rectovaginal: Normal sphincter tone without palpated masses or tenderness.    Assessment/Plan:  71 y.o. G50P1001 female for annual gynecologic exam.   1. Postmenopausal/atrophic genital changes.  No significant hot flushes, night sweats, vaginal dryness or any vaginal bleeding.  Continue to monitor and report any issues or bleeding. 2. 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  Evaluated by Dr. Marti Sleigh who recommended expectant management.  Last Pap smear 02/2017 showed LGSIL.  Pap smear/HPV done today. 3. History of left  breast cancer.  Exam NED.  Mammography 08/2017 normal.  Continue with annual mammography next year.  SBE monthly reviewed. 4. Osteopenia.  DEXA 10/2016 T score -1.9 FRAX 8.8% / 1.3%.  Reports having her vitamin D level follow through her primary physician's office.  Plan repeat DEXA at 2-year interval. 5. Colonoscopy 2012.  Repeat at their recommended interval. 6. Health maintenance.  No routine lab work done as patient does this elsewhere.  Follow-up for Pap smear results.  Follow-up in 1 year for annual exam.   [name] MD, 9:31 AM 09/16/2017

## 2017-09-16 NOTE — Progress Notes (Signed)
l °

## 2017-09-16 NOTE — Patient Instructions (Signed)
Office will call you with laboratory results.

## 2017-09-21 LAB — PAP, TP IMAGING W/ HPV RNA, RFLX HPV TYPE 16,18/45: HPV DNA High Risk: DETECTED — AB

## 2017-10-13 ENCOUNTER — Encounter: Payer: Self-pay | Admitting: Gynecology

## 2017-10-13 ENCOUNTER — Ambulatory Visit (INDEPENDENT_AMBULATORY_CARE_PROVIDER_SITE_OTHER): Payer: Medicare Other | Admitting: Gynecology

## 2017-10-13 VITALS — BP 118/76

## 2017-10-13 DIAGNOSIS — R87612 Low grade squamous intraepithelial lesion on cytologic smear of cervix (LGSIL): Secondary | ICD-10-CM

## 2017-10-13 DIAGNOSIS — R87810 Cervical high risk human papillomavirus (HPV) DNA test positive: Secondary | ICD-10-CM

## 2017-10-13 NOTE — Progress Notes (Signed)
    Christina Edwards 10-02-1945 [id number]        72 y.o.  G1P1001 presents for colposcopy.  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  Her follow-up Pap smears continue to show LGSIL with positive high risk HPV.  Saw Dr. Marti Sleigh in consultation 2014 who recommended continued expectant management with Pap smears every 86-month.  As long as they remained low-grade then to follow expectantly.  Her last Pap smear was 08/2017 which showed LGSIL with positive high risk HPV.  Last colposcopy was 09/2015 which was normal although inadequate with ECC negative.  Past medical history,surgical history, problem list, medications, allergies, family history and social history were all reviewed and documented in the EPIC chart.  Directed ROS with pertinent positives and negatives documented in the history of present illness/assessment and plan.  Exam: [name] assistant Vitals:   10/13/17 1450  BP: 118/76   General appearance:  Normal Abdomen soft nontender without masses guarding rebound Pelvic external BUS vagina with atrophic changes.  Cervix with atrophic changes.  Uterus midline mobile nontender.  Adnexa without masses or tenderness  Colposcopy performed after acetic acid cleanse was inadequate with no transformation zone seen.  No abnormalities were seen.  Single-tooth tenaculum anterior lip stabilization with cervical dilatation using disposable dilator to allow for ECC which was performed without difficulties.  Patient tolerated well. Physical Exam  Genitourinary:       Assessment/Plan:  72 y.o. G1P1001 persistent LGSIL and positive high risk HPV.  Colposcopy was normal although no transformation zone seen.  ECC performed.  Patient will follow-up for results.  Assuming negative or low-grade then plan expectant management with Pap smear in 6 months.  If otherwise then will triage  based upon results.    [name] MD, 3:16 PM 10/13/2017

## 2017-10-13 NOTE — Patient Instructions (Signed)
Office will call you with biopsy results 

## 2017-10-13 NOTE — Addendum Note (Signed)
Addended by: [name] on: 10/13/2017 03:35 PM   Modules accepted: Orders

## 2017-10-15 ENCOUNTER — Other Ambulatory Visit: Payer: Self-pay | Admitting: Gynecology

## 2017-10-15 LAB — TISSUE SPECIMEN

## 2017-10-15 LAB — PATHOLOGY

## 2018-04-12 ENCOUNTER — Encounter: Payer: Self-pay | Admitting: Gynecology

## 2018-04-12 ENCOUNTER — Ambulatory Visit (INDEPENDENT_AMBULATORY_CARE_PROVIDER_SITE_OTHER): Payer: Medicare Other | Admitting: Gynecology

## 2018-04-12 VITALS — BP 118/76

## 2018-04-12 DIAGNOSIS — R87612 Low grade squamous intraepithelial lesion on cytologic smear of cervix (LGSIL): Secondary | ICD-10-CM | POA: Diagnosis not present

## 2018-04-12 DIAGNOSIS — R87810 Cervical high risk human papillomavirus (HPV) DNA test positive: Secondary | ICD-10-CM

## 2018-04-12 NOTE — Addendum Note (Signed)
Addended by: Nelva Nay on: 04/12/2018 09:06 AM   Modules accepted: Orders

## 2018-04-12 NOTE — Patient Instructions (Signed)
Follow-up for Pap smear results. 

## 2018-04-12 NOTE — Progress Notes (Signed)
    Christina Edwards 04-04-1946 [id number]        72 y.o.  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 Patient had LEEP by Dr. 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 Her follow-up Pap smears continue to show LGSIL with positive high risk HPV. Saw Dr. Marti Sleigh in consultation 2014 who recommended continued expectant management with Pap smears every 65-month. As long as they remained low-grade then to follow expectantly. Her last Pap smear was 08/2017 which showed LGSIL with positive high risk HPV.  Colposcopy 09/2017 was inadequate but no abnormality seen.  ECC showed koilocytotic atypia and benign endocervical mucosa.  Patient presents for six-month follow-up Pap smear   Past medical history,surgical history, problem list, medications, allergies, family history and social history were all reviewed and documented in the EPIC chart.  Directed ROS with pertinent positives and negatives documented in the history of present illness/assessment and plan.  Exam: [name] assistant Vitals:   04/12/18 0834  BP: 118/76   General appearance:  Normal Abdomen soft nontender without masses guarding rebound Pelvic external BUS vagina with atrophic changes.  Cervix with atrophic changes.  Pap smear/HPV.  Uterus grossly normal size midline mobile nontender.  Adnexa without masses or tenderness.  Assessment/Plan:  72 y.o. G1P1001 with history as above.  Patient will follow-up for Pap smear results.  If low-grade then plan expectant management.  If otherwise then will triage based upon results.    [name] MD, 8:49 AM 04/12/2018

## 2018-04-13 LAB — PAP IG AND HPV HIGH-RISK: HPV DNA High Risk: DETECTED — AB

## 2018-08-04 ENCOUNTER — Other Ambulatory Visit: Payer: Self-pay | Admitting: Gynecology

## 2018-08-04 DIAGNOSIS — Z1231 Encounter for screening mammogram for malignant neoplasm of breast: Secondary | ICD-10-CM

## 2018-09-21 ENCOUNTER — Ambulatory Visit
Admission: RE | Admit: 2018-09-21 | Discharge: 2018-09-21 | Disposition: A | Discharge status: Home / Self Care | Payer: Medicare Other | Source: Ambulatory Visit | Attending: Gynecology | Admitting: Gynecology

## 2018-09-21 DIAGNOSIS — Z1231 Encounter for screening mammogram for malignant neoplasm of breast: Secondary | ICD-10-CM

## 2018-09-28 ENCOUNTER — Ambulatory Visit (INDEPENDENT_AMBULATORY_CARE_PROVIDER_SITE_OTHER): Payer: Medicare Other | Admitting: Gynecology

## 2018-09-28 ENCOUNTER — Encounter: Payer: Self-pay | Admitting: Gynecology

## 2018-09-28 VITALS — BP 122/76 | Ht 69.0 in | Wt 150.0 lb

## 2018-09-28 DIAGNOSIS — N952 Postmenopausal atrophic vaginitis: Secondary | ICD-10-CM

## 2018-09-28 DIAGNOSIS — R87612 Low grade squamous intraepithelial lesion on cytologic smear of cervix (LGSIL): Secondary | ICD-10-CM

## 2018-09-28 DIAGNOSIS — M8580 Other specified disorders of bone density and structure, unspecified site: Secondary | ICD-10-CM | POA: Diagnosis not present

## 2018-09-28 DIAGNOSIS — Z01419 Encounter for gynecological examination (general) (routine) without abnormal findings: Secondary | ICD-10-CM

## 2018-09-28 DIAGNOSIS — Z1151 Encounter for screening for human papillomavirus (HPV): Secondary | ICD-10-CM

## 2018-09-28 NOTE — Progress Notes (Signed)
    Christina Edwards 10-Mar-1946 [id number]        73 y.o.  G1P1001 for annual gynecologic exam.  Without gynecologic complaints.  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  Past medical history,surgical history, problem list, medications, allergies, family history and social history were all reviewed and documented as reviewed in the EPIC chart.  ROS:  Performed with pertinent positives and negatives included in the history, assessment and plan.   Additional significant findings : None   Exam: Christina Edwards assistant Vitals:   09/28/18 1402  BP: 122/76  Weight: 150 lb (68 kg)  Height: 5\' 9"  (1.753 m)   Body mass index is 22.15 kg/m.  General appearance:  Normal affect, orientation and appearance. Skin: Grossly normal HEENT: Without gross lesions.  No cervical or supraclavicular adenopathy. Thyroid normal.  Lungs:  Clear without wheezing, rales or rhonchi Cardiac: RR, without RMG Abdominal:  Soft, nontender, without masses, guarding, rebound, organomegaly or hernia Breasts:  Examined lying and sitting without masses, retractions, discharge or axillary adenopathy.  Well-healed left lumpectomy scar. Pelvic:  Ext, BUS, Vagina: With atrophic changes  Cervix: With atrophic changes.  Pap smear/HPV  Uterus: Anteverted, normal size, shape and contour, midline and mobile nontender   Adnexa: Without masses or tenderness    Anus and perineum: Normal   Rectovaginal: Normal sphincter tone without palpated masses or tenderness.    Assessment/Plan:  73 y.o. G59P1001 female for annual gynecologic exam.   1. Postmenopausal.  No significant menopausal symptoms or any vaginal bleeding. 2. 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  Consultation with Dr. Marti Sleigh recommended expectant management.  Colposcopy 09/2017 was inadequate normal with ECC showing focal koilocytotic atypia and benign appearing endocervical glands.  Follow-up Pap smear 03/2018  showed LGSIL with positive high risk HPV.  Pap smear/HPV today.  If low-grade then continue expectant management.  If high-grade then will discuss treatment options. 3. History of left breast cancer.  Exam NED.  Mammography 09/21/2018.  SBE monthly reviewed 4. Osteopenia.  DEXA 10/2016 T score -1.9 FRAX 8.8% / 1.3%.  Schedule follow-up DEXA over the next several months at 2-year interval. 5. Colonoscopy 2019.  Repeat at their recommended interval. 6. Health maintenance.  No routine lab work done as patient does this elsewhere.  Follow-up for Pap smear results.  Follow-up in 1 year for annual exam.   Christina Auerbach MD, 2:29 PM 09/28/2018

## 2018-09-28 NOTE — Patient Instructions (Signed)
Followup for bone density as scheduled. 

## 2018-09-28 NOTE — Addendum Note (Signed)
Addended by: Nelva Nay on: 09/28/2018 04:07 PM   Modules accepted: Orders

## 2018-09-29 LAB — PAP IG AND HPV HIGH-RISK: HPV DNA High Risk: DETECTED — AB

## 2018-09-30 ENCOUNTER — Encounter: Payer: Self-pay | Admitting: Gynecology

## 2018-11-25 ENCOUNTER — Other Ambulatory Visit: Payer: Self-pay | Admitting: Gynecology

## 2018-11-25 ENCOUNTER — Encounter: Payer: Self-pay | Admitting: Gynecology

## 2018-11-25 ENCOUNTER — Ambulatory Visit (INDEPENDENT_AMBULATORY_CARE_PROVIDER_SITE_OTHER): Payer: Medicare Other

## 2018-11-25 DIAGNOSIS — Z780 Asymptomatic menopausal state: Secondary | ICD-10-CM | POA: Diagnosis not present

## 2018-11-25 DIAGNOSIS — M8589 Other specified disorders of bone density and structure, multiple sites: Secondary | ICD-10-CM | POA: Diagnosis not present

## 2018-11-25 DIAGNOSIS — M8580 Other specified disorders of bone density and structure, unspecified site: Secondary | ICD-10-CM

## 2019-02-04 ENCOUNTER — Emergency Department (HOSPITAL_COMMUNITY): Payer: Medicare Other

## 2019-02-04 ENCOUNTER — Encounter (HOSPITAL_COMMUNITY): Payer: Self-pay | Admitting: Emergency Medicine

## 2019-02-04 ENCOUNTER — Other Ambulatory Visit: Payer: Self-pay

## 2019-02-04 ENCOUNTER — Observation Stay (HOSPITAL_COMMUNITY)
Admission: EM | Admit: 2019-02-04 | Discharge: 2019-02-05 | Disposition: A | Discharge status: Home / Self Care | Payer: Medicare Other | Attending: Internal Medicine | Admitting: Internal Medicine

## 2019-02-04 DIAGNOSIS — R0789 Other chest pain: Principal | ICD-10-CM | POA: Insufficient documentation

## 2019-02-04 DIAGNOSIS — E785 Hyperlipidemia, unspecified: Secondary | ICD-10-CM | POA: Diagnosis not present

## 2019-02-04 DIAGNOSIS — R079 Chest pain, unspecified: Secondary | ICD-10-CM | POA: Diagnosis not present

## 2019-02-04 DIAGNOSIS — Z87891 Personal history of nicotine dependence: Secondary | ICD-10-CM | POA: Diagnosis not present

## 2019-02-04 DIAGNOSIS — R072 Precordial pain: Secondary | ICD-10-CM | POA: Diagnosis not present

## 2019-02-04 DIAGNOSIS — Z79899 Other long term (current) drug therapy: Secondary | ICD-10-CM | POA: Insufficient documentation

## 2019-02-04 DIAGNOSIS — Z20828 Contact with and (suspected) exposure to other viral communicable diseases: Secondary | ICD-10-CM | POA: Diagnosis not present

## 2019-02-04 DIAGNOSIS — Z853 Personal history of malignant neoplasm of breast: Secondary | ICD-10-CM | POA: Diagnosis not present

## 2019-02-04 LAB — CBC WITH DIFFERENTIAL/PLATELET
Abs Immature Granulocytes: 0.01 10*3/uL (ref 0.00–0.07)
Basophils Absolute: 0 10*3/uL (ref 0.0–0.1)
Basophils Relative: 0 %
Eosinophils Absolute: 0.1 10*3/uL (ref 0.0–0.5)
Eosinophils Relative: 2 %
HCT: 40 % (ref 36.0–46.0)
Hemoglobin: 12.6 g/dL (ref 12.0–15.0)
Immature Granulocytes: 0 %
Lymphocytes Relative: 26 %
Lymphs Abs: 1.4 10*3/uL (ref 0.7–4.0)
MCH: 30.7 pg (ref 26.0–34.0)
MCHC: 31.5 g/dL (ref 30.0–36.0)
MCV: 97.6 fL (ref 80.0–100.0)
Monocytes Absolute: 0.3 10*3/uL (ref 0.1–1.0)
Monocytes Relative: 6 %
Neutro Abs: 3.4 10*3/uL (ref 1.7–7.7)
Neutrophils Relative %: 66 %
Platelets: 310 10*3/uL (ref 150–400)
RBC: 4.1 MIL/uL (ref 3.87–5.11)
RDW: 13.4 % (ref 11.5–15.5)
WBC: 5.2 10*3/uL (ref 4.0–10.5)
nRBC: 0 % (ref 0.0–0.2)

## 2019-02-04 LAB — TROPONIN I
Troponin I: <0.03 ng/mL (ref <0.03)
Troponin I: <0.03 ng/mL (ref <0.03)

## 2019-02-04 LAB — COMPREHENSIVE METABOLIC PANEL
ALT: 23 U/L (ref 0–44)
AST: 24 U/L (ref 15–41)
Albumin: 4.1 g/dL (ref 3.5–5.0)
Alkaline Phosphatase: 85 U/L (ref 38–126)
Anion gap: 9 (ref 5–15)
BUN: 16 mg/dL (ref 8–23)
CO2: 23 mmol/L (ref 22–32)
Calcium: 9.3 mg/dL (ref 8.9–10.3)
Chloride: 107 mmol/L (ref 98–111)
Creatinine, Ser: 0.78 mg/dL (ref 0.44–1.00)
GFR calc Af Amer: >60 mL/min (ref >60)
GFR calc non Af Amer: >60 mL/min (ref >60)
Glucose, Bld: 105 mg/dL — ABNORMAL HIGH (ref 70–99)
Potassium: 3.8 mmol/L (ref 3.5–5.1)
Sodium: 139 mmol/L (ref 135–145)
Total Bilirubin: 0.7 mg/dL (ref 0.3–1.2)
Total Protein: 7.2 g/dL (ref 6.5–8.1)

## 2019-02-04 LAB — LIPASE, BLOOD: Lipase: 29 U/L (ref 11–51)

## 2019-02-04 LAB — SARS CORONAVIRUS 2 BY RT PCR (HOSPITAL ORDER, PERFORMED IN ~~LOC~~ HOSPITAL LAB): SARS Coronavirus 2: NEGATIVE

## 2019-02-04 MED ORDER — ACETAMINOPHEN 325 MG PO TABS: 650.0000 mg | ORAL_TABLET | ORAL | Status: DC | PRN

## 2019-02-04 MED ORDER — ENOXAPARIN SODIUM 40 MG/0.4ML ~~LOC~~ SOLN
40.0000 mg | SUBCUTANEOUS | Status: DC
  Administered 2019-02-04: 21:00:00 40 mg via SUBCUTANEOUS
  Filled 2019-02-04: qty 0.4

## 2019-02-04 MED ORDER — ALUM & MAG HYDROXIDE-SIMETH 200-200-20 MG/5ML PO SUSP
30.0000 mL | Freq: Once | ORAL | Status: AC
  Administered 2019-02-04: 30 mL via ORAL
  Filled 2019-02-04: qty 30

## 2019-02-04 MED ORDER — LORATADINE 10 MG PO TABS
10.0000 mg | ORAL_TABLET | Freq: Every day | ORAL | Status: DC
  Administered 2019-02-05: 10 mg via ORAL
  Filled 2019-02-04: qty 1

## 2019-02-04 MED ORDER — NITROGLYCERIN 0.4 MG SL SUBL: 0.4000 mg | SUBLINGUAL_TABLET | SUBLINGUAL | Status: DC | PRN

## 2019-02-04 MED ORDER — ONDANSETRON HCL 4 MG/2ML IJ SOLN: 4.0000 mg | Freq: Four times a day (QID) | INTRAMUSCULAR | Status: DC | PRN

## 2019-02-04 MED ORDER — MORPHINE SULFATE (PF) 2 MG/ML IV SOLN: 2.0000 mg | INTRAVENOUS | Status: DC | PRN

## 2019-02-04 NOTE — ED Triage Notes (Signed)
[facility]- pt brought in from PCP. Pt went for evaluation of CP since Monday. Pt recently bought land and has been physically clearing trees. Pt states her CP is more of a pressure than pain. Pt received 324 ASA and 2 nitro.  110/80 76 HR 94% RA 97.9

## 2019-02-04 NOTE — H&P (Addendum)
History and Physical    Christina Edwards YIF:[id number] DOB: Jul 05, 1946 DOA: 02/04/2019  Referring MD/NP/PA: Duffy Bruce, MD PCP: Carol Ada, MD  Patient coming from: Home  Chief Complaint: Chest pain  I have personally briefly reviewed patient's old medical records in [place]   HPI: Christina Edwards is a 73 y.o. female with medical history significant of breast cancer s/p lumpectomy and radiation; who presents with complaints of chest pain. 3 weeks ago her and her husband on [place] acres [place] farm land.  For the last 2 weeks they have been cleaning up area and she reports that she has been doing a lot of lifting.  Started having a dull substernal pain approximately 5 days ago.  Denies any radiation of pain.  She talked to her primary care provider who felt like symptoms could possibly be musculoskeletal.  However, symptoms change and last night she complained of having more-so substernal pressure-like feeling while laying in her bed with diaphoresis.  Symptoms lasted approximately 15 minutes before self resolved, but came back this morning.  She went to her primary care provider, and was given full dose aspirin and nitroglycerin.  She reports that the second tablet of nitroglycerin helped relieve symptoms.  She notes that her father died suddenly of heart attack at the age of 2.  ED Course: Upon admission into the emergency department blood pressure noted to be 137/63-138/95, and all other vital signs within normal limits.  Labs including CBC, BMP and troponin were within normal limits.  EKG noted an incomplete right bundle branch block, but no previous EKG to compare.  Chest x-ray noted chronic lung changes without any acute abnormalities.  TRH called to admit.   Review of Systems  Constitutional: Positive for diaphoresis. Negative for fever.  HENT: Negative for congestion and nosebleeds.   Eyes: Negative for double vision and photophobia.  Respiratory: Negative for  sputum production and shortness of breath.   Cardiovascular: Positive for chest pain. Negative for leg swelling.  Gastrointestinal: Negative for abdominal pain, nausea and vomiting.  Genitourinary: Negative for dysuria and hematuria.  Musculoskeletal: Positive for joint pain and myalgias. Negative for neck pain.  Skin: Negative for itching and rash.  Neurological: Negative for tingling, tremors and sensory change.  Endo/Heme/Allergies: Positive for environmental allergies.  Psychiatric/Behavioral: Negative for substance abuse. The patient is not nervous/anxious.   All other systems reviewed and are negative.   Past Medical History:  Diagnosis Date  . Breast cancer ([place]) 1987   Left breast intraductal  . Endometriosis   . GERD (gastroesophageal reflux disease)   . 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 Osteopenia 11/2018   T score -1.9 asked 9% / 1.4%  . Personal history of radiation therapy     Past Surgical History:  Procedure Laterality Date  . APPENDECTOMY  1954  . BREAST LUMPECTOMY Left 1987  . BREAST SURGERY  1987   Lumpectomy  . CERVICAL BIOPSY  W/ LOOP ELECTRODE EXCISION    . EYE SURGERY     laser to fix torn retina  . HAND SURGERY     left hand  . OOPHORECTOMY  1989   LSO  . TONSILLECTOMY  1955  . TUBAL LIGATION  1984     reports that she has quit smoking. She has never used smokeless tobacco. She reports that she does not drink alcohol or use drugs.  Allergies  Allergen Reactions  .  Neosporin [Neomycin-Bacitracin Zn-Polymyx]     rash  . Sulfa Antibiotics   . Adhesive [Tape] Rash    Family History  Problem Relation Age of Onset  . Cancer Mother        Colon cancer  . Hypertension Mother   . Ovarian cancer Mother   . Heart disease Father   . Breast cancer Paternal Grandmother        Age 55's    Prior to Admission medications   Medication Sig Start Date End Date Taking? Authorizing Provider   Acetaminophen (TYLENOL 8 HOUR PO) Take by mouth. Prn     [provider]  Calcium Carbonate-Simethicone (TUMS PLUS PO) Take by mouth.    [provider]  Phenyleph-Doxylamine-DM-APAP (ALKA SELTZER PLUS PO) Take by mouth as needed.    [provider]  ValACYclovir HCl (VALTREX PO) Take by mouth as needed.    [provider]    Physical Exam:  Constitutional: Elderly female currently in NAD, calm, comfortable Vitals:   02/04/19 1236 02/04/19 1241 02/04/19 1450  BP:  (!) 138/95 137/63  Pulse:  63 75  Resp:  18 18  Temp:  97.9 F (36.6 C)   TempSrc:  Oral   SpO2:  100% 100%  Weight: 68 kg    Height: 5\' 9"  (1.753 m)     Eyes: PERRL, lids and conjunctivae normal ENMT: Mucous membranes are moist. Posterior pharynx clear of any exudate or lesions. Normal dentition.  Neck: normal, supple, no masses, no thyromegaly Respiratory: clear to auscultation bilaterally, no wheezing, no crackles. Normal respiratory effort. No accessory muscle use.  Cardiovascular: Regular rate and rhythm, no murmurs / rubs / gallops. No extremity edema. 2+ pedal pulses. No carotid bruits.  Abdomen: no tenderness, no masses palpated. No hepatosplenomegaly. Bowel sounds positive.  Musculoskeletal: no clubbing / cyanosis. No joint deformity upper and lower extremities. Good ROM, no contractures. Normal muscle tone.  Skin: no rashes, lesions, ulcers. No induration Neurologic: CN 2-12 grossly intact. Sensation intact, DTR normal. Strength 5/5 in all 4.  Psychiatric: Normal judgment and insight. Alert and oriented x 3. Normal mood.     Labs on Admission: I have personally reviewed following labs and imaging studies  CBC: Recent Labs  Lab 02/04/19 1333  WBC 5.2  NEUTROABS 3.4  HGB 12.6  HCT 40.0  MCV 97.6  PLT 706   Basic Metabolic Panel: Recent Labs  Lab 02/04/19 1333  NA 139  K 3.8  CL 107  CO2 23  GLUCOSE 105*  BUN 16  CREATININE 0.78  CALCIUM 9.3    GFR: Estimated Creatinine Clearance: 67.4 mL/min (by C-G formula based on SCr of 0.78 mg/dL). Liver Function Tests: Recent Labs  Lab 02/04/19 1333  AST 24  ALT 23  ALKPHOS 85  BILITOT 0.7  PROT 7.2  ALBUMIN 4.1   Recent Labs  Lab 02/04/19 1333  LIPASE 29   No results for input(s): AMMONIA in the last 168 hours. Coagulation Profile: No results for input(s): INR, PROTIME in the last 168 hours. Cardiac Enzymes: Recent Labs  Lab 02/04/19 1333  TROPONINI <0.03   BNP (last 3 results) No results for input(s): PROBNP in the last 8760 hours. HbA1C: No results for input(s): HGBA1C in the last 72 hours. CBG: No results for input(s): GLUCAP in the last 168 hours. Lipid Profile: No results for input(s): CHOL, HDL, LDLCALC, TRIG, CHOLHDL, LDLDIRECT in the last 72 hours. Thyroid Function Tests: No results for input(s): TSH, T4TOTAL, FREET4, T3FREE, THYROIDAB  in the last 72 hours. Anemia Panel: No results for input(s): VITAMINB12, FOLATE, FERRITIN, TIBC, IRON, RETICCTPCT in the last 72 hours. Urine analysis:    Component Value Date/Time   COLORURINE YELLOW 09/06/2014 [place] 09/06/2014 1015   LABSPEC 1.013 09/06/2014 1015   PHURINE 7.0 09/06/2014 1015   GLUCOSEU NEG 09/06/2014 1015   HGBUR NEG 09/06/2014 1015   BILIRUBINUR NEG 09/06/2014 1015   KETONESUR NEG 09/06/2014 1015   PROTEINUR NEG 09/06/2014 1015   UROBILINOGEN 0.2 09/06/2014 1015   NITRITE NEG 09/06/2014 1015   LEUKOCYTESUR NEG 09/06/2014 1015   Sepsis Labs: No results found for this or any previous visit (from the past 240 hour(s)).   Radiological Exams on Admission: Dg Chest 2 View  Result Date: 02/04/2019 CLINICAL DATA:  73 year old female with lumpectomy and chest pain EXAM: CHEST - 2 VIEW COMPARISON:  08/22/2013 FINDINGS: Cardiomediastinal silhouette unchanged in size and contour. Surgical changes of the left axilla/chest. No pneumothorax or pleural effusion. No confluent airspace  disease. No displaced fracture IMPRESSION: Chronic lung changes without evidence of acute cardiopulmonary disease Surgical changes of the left axilla/chest Electronically Signed   By: Corrie Mckusick D.O.   On: 02/04/2019 13:22    EKG: Independently reviewed.  Sinus rhythm at 72 bpm with incomplete right bundle branch block  Assessment/Plan Chest pain, right bundle branch block: Acute.  Patient presents with complaints of chest pressure/pain in setting of recent heavy lifting.  However, notes a change in pain consistency and relief with nitroglycerin.  Heart score approximately 4. -Admit to a telemetry bed -Chest pain order set initiated -Trend cardiac troponin x3  -Nitroglycerin as needed for chest pain -Check echocardiogram -Check lipid panel in a.m. -Cardiology consulted, will follow for further recommendations  History of breast cancer: Patient status post lumpectomy and radiation.  DVT prophylaxis: Lovenox Code Status: Full Family Communication: No family present at bedside Disposition Plan: Possible discharge home in a.m. if work-up negative Consults called: Cards Admission status: Observation Norval Morton MD [facility] Pager [phone]   If 7PM-7AM, please contact night-coverage [email or web address] Password [facility]  02/04/2019, 3:31 PM

## 2019-02-04 NOTE — Consult Note (Signed)
Cardiology Consultation:   Patient ID: Christina Edwards MRN: [id number]; DOB: Jun 21, 1946  Admit date: 02/04/2019 Date of Consult: 02/04/2019  Primary Care Provider: Carol Ada, MD Primary Cardiologist: Christina Edwards  Patient Profile:   Christina Edwards is a 73 y.o. female with a hx of breast cancer, GERD, osteopenia who is being seen today for the evaluation of chest pain at the request of Christina Edwards.  History of Present Illness:   Christina Edwards has been doing heavy work on a recently purchased piece of land for the last three weeks. She notes that when she lifts/pulls small trees, lifts the trailer, etc she has some chest discomfort not during the event but later when she is resting. She has attributed this to MSK pain. However, once last night and then again earlier today, she had episodes of central chest discomfort/pressure associated with some mild diaphoresis. Lasted 5-10 minutes, resolved on its own, about a 4/10. Went to her PCP, received aspirin and 2 doses of SLNG, improved. Recommended to present to ER.  Denies shortness of breath at rest or with normal exertion. No PND, orthopnea, LE edema or unexpected weight gain. No syncope or palpitations.  FH notable for father who died of MI at age 36 (suddenly, no prior history).  Past Medical History:  Diagnosis Date  . Breast cancer ([place]) 1987   Left breast intraductal  . Endometriosis   . GERD (gastroesophageal reflux disease)   . 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 Osteopenia 11/2018   T score -1.9 asked 9% / 1.4%  . Personal history of radiation therapy     Past Surgical History:  Procedure Laterality Date  . APPENDECTOMY  1954  . BREAST LUMPECTOMY Left 1987  . BREAST SURGERY  1987   Lumpectomy  . CERVICAL BIOPSY  W/ LOOP ELECTRODE EXCISION    . EYE SURGERY     laser to fix torn retina  . HAND SURGERY     left hand  . OOPHORECTOMY  1989    LSO  . TONSILLECTOMY  1955  . TUBAL LIGATION  1984     Home Medications:  Prior to Admission medications   Medication Sig Start Date End Date Taking? Authorizing Provider  acetaminophen (TYLENOL) 500 MG tablet Take 500 mg by mouth every 6 (six) hours as needed for mild pain.   Yes [provider]  Calcium Carbonate-Simethicone (TUMS PLUS PO) Take 2 tablets by mouth daily as needed (heartburn).    Yes [provider]  ibuprofen (ADVIL) 200 MG tablet Take 200 mg by mouth every 8 (eight) hours as needed for mild pain.   Yes [provider]  loratadine (CLARITIN) 10 MG tablet Take 10 mg by mouth daily.   Yes [provider]  Phenyleph-Doxylamine-DM-APAP (ALKA SELTZER PLUS PO) Take 1 tablet by mouth daily as needed (runny nose).    Yes [provider]  valACYclovir (VALTREX) 500 MG tablet Take 500 mg by mouth 2 (two) times daily as needed (outbreak for 7 days).    Yes [provider]    Inpatient Medications: Scheduled Meds: . alum & mag hydroxide-simeth  30 mL Oral Once  . enoxaparin (LOVENOX) injection  40 mg Subcutaneous Q24H  . loratadine  10 mg Oral Daily   Continuous Infusions:  PRN Meds: acetaminophen, morphine injection, nitroGLYCERIN, ondansetron (ZOFRAN) IV  Allergies:    Allergies  Allergen Reactions  . Sulfa  Antibiotics Other (See Comments)    Reaction unknown  . Adhesive [Tape] Rash  . Neosporin [Neomycin-Bacitracin Zn-Polymyx] Rash    Social History:   Social History   Socioeconomic History  . Marital status: Married    Spouse name: Not on file  . Number of children: Not on file  . Years of education: Not on file  . Highest education level: Not on file  Occupational History  . Not on file  Social Needs  . Financial resource strain: Not on file  . Food insecurity:    Worry: Not on file    Inability: Not on file  . Transportation needs:    Medical: Not on file    Non-medical: Not on file  Tobacco  Use  . Smoking status: Former Research scientist (life sciences)  . Smokeless tobacco: Never Used  . Tobacco comment: in college not much  Substance and Sexual Activity  . Alcohol use: No    Alcohol/week: 0.0 standard drinks  . Drug use: No  . Sexual activity: Yes    Birth control/protection: Surgical, Post-menopausal    Comment: 1st intercourse 73 yo-Fewer than 5 partners  Lifestyle  . Physical activity:    Days per week: Not on file    Minutes per session: Not on file  . Stress: Not on file  Relationships  . Social connections:    Talks on phone: Not on file    Gets together: Not on file    Attends religious service: Not on file    Active member of club or organization: Not on file    Attends meetings of clubs or organizations: Not on file    Relationship status: Not on file  . Intimate partner violence:    Fear of current or ex partner: Not on file    Emotionally abused: Not on file    Physically abused: Not on file    Forced sexual activity: Not on file  Other Topics Concern  . Not on file  Social History Narrative  . Not on file    Family History:    Family History  Problem Relation Age of Onset  . Cancer Mother        Colon cancer  . Hypertension Mother   . Ovarian cancer Mother   . Heart disease Father   . Breast cancer Paternal Grandmother        Age 63's     ROS:  Please see the history of present illness.  Constitutional: Negative for chills, fever, night sweats, unintentional weight loss  HENT: Negative for ear pain and hearing loss.   Eyes: Negative for loss of vision and eye pain.  Respiratory: Negative for cough, sputum, wheezing.   Cardiovascular: See HPI. Gastrointestinal: Negative for abdominal pain, melena, and hematochezia.  Genitourinary: Negative for dysuria and hematuria.  Musculoskeletal: Negative for falls. Occasional myalgias. Skin: Negative for itching and rash.  Neurological: Negative for focal weakness, focal sensory changes and loss of consciousness.   Endo/Heme/Allergies: Does not bruise/bleed easily.  All other ROS reviewed and negative.     Physical Exam/Data:   Vitals:   02/04/19 1236 02/04/19 1241 02/04/19 1450 02/04/19 1652  BP:  (!) 138/95 137/63 (!) 151/91  Pulse:  63 75 73  Resp:  18 18 18   Temp:  97.9 F (36.6 C)  98.3 F (36.8 C)  TempSrc:  Oral  Oral  SpO2:  100% 100% 98%  Weight: 68 kg   67.6 kg  Height: 5\' 9"  (1.753 m)   5'  9" (1.753 m)   No intake or output data in the 24 hours ending 02/04/19 1705 Last 3 Weights 02/04/2019 02/04/2019 09/28/2018  Weight (lbs) 149 lb 150 lb 150 lb  Weight (kg) 67.586 kg 68.04 kg 68.04 kg     Body mass index is 22 kg/m.  General:  Well nourished, well developed, in no acute distress HEENT: normal Lymph: no adenopathy Neck: no JVD Endocrine:  No thryomegaly Vascular: No carotid bruits; RA pulses 2+ bilaterally Cardiac:  normal S1, S2; RRR; no murmur Lungs:  clear to auscultation bilaterally, no wheezing, rhonchi or rales  Abd: soft, nontender, no hepatomegaly  Ext: no edema Musculoskeletal:  No deformities, BUE and BLE strength normal and equal Skin: warm and dry  Neuro:  CNs 2-12 intact, no focal abnormalities noted Psych:  Normal affect   EKG:  The EKG was personally reviewed and demonstrates:  NSR. QRS is 94, borderline IVCD Telemetry:  Telemetry was personally reviewed and demonstrates:  NSR  Relevant CV Studies: none  Laboratory Data:  Chemistry Recent Labs  Lab 02/04/19 1333  NA 139  K 3.8  CL 107  CO2 23  GLUCOSE 105*  BUN 16  CREATININE 0.78  CALCIUM 9.3  GFRNONAA >60  GFRAA >60  ANIONGAP 9    Recent Labs  Lab 02/04/19 1333  PROT 7.2  ALBUMIN 4.1  AST 24  ALT 23  ALKPHOS 85  BILITOT 0.7   Hematology Recent Labs  Lab 02/04/19 1333  WBC 5.2  RBC 4.10  HGB 12.6  HCT 40.0  MCV 97.6  MCH 30.7  MCHC 31.5  RDW 13.4  PLT 310   Cardiac Enzymes Recent Labs  Lab 02/04/19 1333  TROPONINI <0.03   No results for input(s): TROPIPOC in  the last 168 hours.  BNPNo results for input(s): BNP, PROBNP in the last 168 hours.  DDimer No results for input(s): DDIMER in the last 168 hours.  Radiology/Studies:  Dg Chest 2 View  Result Date: 02/04/2019 CLINICAL DATA:  73 year old female with lumpectomy and chest pain EXAM: CHEST - 2 VIEW COMPARISON:  08/22/2013 FINDINGS: Cardiomediastinal silhouette unchanged in size and contour. Surgical changes of the left axilla/chest. No pneumothorax or pleural effusion. No confluent airspace disease. No displaced fracture IMPRESSION: Chronic lung changes without evidence of acute cardiopulmonary disease Surgical changes of the left axilla/chest Electronically Signed   By: Corrie Mckusick D.O.   On: 02/04/2019 13:22    Assessment and Edwards:   Chest pain -Heart score of 4 for story and age. No other personal risk factors (though father with MI at age 72) -recommend trending troponins x3 -if troponins bump, would treat as NSTEMI, Edwards for cath on 5/18 -if troponins remain negative, she would be an ideal candidate for a coronary CTA. This rarely can be done on the weekends (can page cardiology tomorrow AM if troponins negative to see if available). If not available on the weekend, would discharge her and order coronary CT as an outpatient. We discussed this approach vs. Traditional stress test. She would much prefer the definitive evaluation of a CT. She does understand that if she goes home and has this done as an outpatient, there will be a period of time, up to several weeks, when she will not have known status of her coronary flow. She understands that if she has a severe event in that time she will need to re-present. -check lipids, A1c for risk assessment  The ASCVD Risk score Mikey Bussing DC Brooke Bonito., et al., 2013)  failed to calculate for the following reasons:   Cannot find a previous HDL lab   Cannot find a previous total cholesterol lab  I would be happy to see her post discharge to discuss the results of  her testing and optimize her from a prevention standpoint.  For questions or updates, please contact [place] Please consult [email or web address] for contact info under   Signed, Buford Dresser, MD  02/04/2019 5:05 PM

## 2019-02-04 NOTE — ED Provider Notes (Signed)
[facility] EMERGENCY DEPARTMENT Provider Note   CSN: [id number] Arrival date & time: 02/04/19  1231    History   Chief Complaint No chief complaint on file.   HPI Christina Edwards is a 73 y.o. female.  HPI: A 73 year old patient presents for evaluation of chest pain. Initial onset of pain was more than 6 hours ago. The patient's chest pain is described as heaviness/pressure/tightness, is not worse with exertion and is relieved by nitroglycerin. The patient reports some diaphoresis. The patient's chest pain is middle- or left-sided, is not well-localized, is not sharp and does not radiate to the arms/jaw/neck. The patient does not complain of nausea. The patient has no history of stroke, has no history of peripheral artery disease, has not smoked in the past 90 days, denies any history of treated diabetes, has no relevant family history of coronary artery disease (first degree relative at less than age 24), is not hypertensive, has no history of hypercholesterolemia and does not have an elevated BMI (>=30).   HPI  73 yo F with PMHx below here with dull substernal CP. Initially thought it was MSK pain, but became a dull pressure last night. Worse lying flat, but also w/ exertion. Has had nausea, diaphoresis with it as well which is new. Does admit to taking Ibuprofen x 2, also Alka Seltzer but no other meds. No h/o ulcers.  Past Medical History:  Diagnosis Date  . Breast cancer ([place]) 1987   Left breast intraductal  . Endometriosis   . GERD (gastroesophageal reflux disease)   . 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 Osteopenia 11/2018   T score -1.9 asked 9% / 1.4%  . Personal history of radiation therapy     Patient Active Problem List   Diagnosis Date Noted  . Abnormal Pap smear, low grade squamous intraepithelial lesion (LGSIL) 04/26/2013  . GERD (gastroesophageal reflux disease)   . Endometriosis   .  Osteopenia   . Breast cancer [facility])     Past Surgical History:  Procedure Laterality Date  . APPENDECTOMY  1954  . BREAST LUMPECTOMY Left 1987  . BREAST SURGERY  1987   Lumpectomy  . CERVICAL BIOPSY  W/ LOOP ELECTRODE EXCISION    . EYE SURGERY     laser to fix torn retina  . HAND SURGERY     left hand  . OOPHORECTOMY  1989   LSO  . TONSILLECTOMY  1955  . TUBAL LIGATION  1984     OB History    Gravida  1   Para  1   Term  1   Preterm      AB      Living  1     SAB      TAB      Ectopic      Multiple      Live Births               Home Medications    Prior to Admission medications   Medication Sig Start Date End Date Taking? Authorizing Provider  Acetaminophen (TYLENOL 8 HOUR PO) Take by mouth. Prn     [provider]  Calcium Carbonate-Simethicone (TUMS PLUS PO) Take by mouth.    [provider]  Phenyleph-Doxylamine-DM-APAP (ALKA SELTZER PLUS PO) Take by mouth as needed.    [provider]  ValACYclovir HCl (VALTREX PO) Take by mouth as needed.  [provider]    Family History Family History  Problem Relation Age of Onset  . Cancer Mother        Colon cancer  . Hypertension Mother   . Ovarian cancer Mother   . Heart disease Father   . Breast cancer Paternal Grandmother        Age 6's    Social History Social History   Tobacco Use  . Smoking status: Former Research scientist (life sciences)  . Smokeless tobacco: Never Used  . Tobacco comment: in college not much  Substance Use Topics  . Alcohol use: No    Alcohol/week: 0.0 standard drinks  . Drug use: No     Allergies   Neosporin [neomycin-bacitracin zn-polymyx]; Sulfa antibiotics; and Adhesive [tape]   Review of Systems Review of Systems  Constitutional: Positive for fatigue. Negative for chills and fever.  HENT: Negative for congestion and rhinorrhea.   Eyes: Negative for visual disturbance.  Respiratory: Positive for chest tightness. Negative for cough,  shortness of breath and wheezing.   Cardiovascular: Positive for chest pain. Negative for leg swelling.  Gastrointestinal: Negative for abdominal pain, diarrhea, nausea and vomiting.  Genitourinary: Negative for dysuria and flank pain.  Musculoskeletal: Negative for neck pain and neck stiffness.  Skin: Negative for rash and wound.  Allergic/Immunologic: Negative for immunocompromised state.  Neurological: Positive for weakness. Negative for syncope and headaches.  All other systems reviewed and are negative.    Physical Exam Updated Vital Signs BP (!) 138/95   Pulse 63   Temp 97.9 F (36.6 C) (Oral)   Resp 18   Ht 5\' 9"  (1.753 m)   Wt 68 kg   LMP 09/22/2000   SpO2 100%   BMI 22.15 kg/m   Physical Exam Vitals signs and nursing note reviewed.  Constitutional:      General: She is not in acute distress.    Appearance: She is well-developed.  HENT:     Head: Normocephalic and atraumatic.  Eyes:     Conjunctiva/sclera: Conjunctivae normal.  Neck:     Musculoskeletal: Neck supple.  Cardiovascular:     Rate and Rhythm: Normal rate and regular rhythm.     Heart sounds: Normal heart sounds.  Pulmonary:     Effort: Pulmonary effort is normal. No respiratory distress.     Breath sounds: No wheezing.  Abdominal:     General: There is no distension.  Skin:    General: Skin is warm.     Capillary Refill: Capillary refill takes less than 2 seconds.     Findings: No rash.  Neurological:     Mental Status: She is alert and oriented to person, place, and time.     Motor: No abnormal muscle tone.      ED Treatments / Results  Labs (all labs ordered are listed, but only abnormal results are displayed) Labs Reviewed  COMPREHENSIVE METABOLIC PANEL - Abnormal; Notable for the following components:      Result Value   Glucose, Bld 105 (*)    All other components within normal limits  CBC WITH DIFFERENTIAL/PLATELET  TROPONIN I  LIPASE, BLOOD    EKG EKG  Interpretation  Date/Time:  Friday Feb 04 2019 12:36:46 EDT Ventricular Rate:  62 PR Interval:  156 QRS Duration: 94 QT Interval:  412 QTC Calculation: 418 R Axis:   46 Text Interpretation:  Normal sinus rhythm Incomplete right bundle branch block Borderline ECG No old tracing to compare Confirmed by Duffy Bruce [id number]) on 02/04/2019 12:58:26 PM  Radiology Dg Chest 2 View  Result Date: 02/04/2019 CLINICAL DATA:  73 year old female with lumpectomy and chest pain EXAM: CHEST - 2 VIEW COMPARISON:  08/22/2013 FINDINGS: Cardiomediastinal silhouette unchanged in size and contour. Surgical changes of the left axilla/chest. No pneumothorax or pleural effusion. No confluent airspace disease. No displaced fracture IMPRESSION: Chronic lung changes without evidence of acute cardiopulmonary disease Surgical changes of the left axilla/chest Electronically Signed   By: Corrie Mckusick D.O.   On: 02/04/2019 13:22    Procedures Procedures (including critical care time)  Medications Ordered in ED Medications  nitroGLYCERIN (NITROSTAT) SL tablet 0.4 mg (has no administration in time range)     Initial Impression / Assessment and Plan / ED Course  I have reviewed the triage vital signs and the nursing notes.  Pertinent labs & imaging results that were available during my care of the patient were reviewed by me and considered in my medical decision making (see chart for details).  Clinical Course as of Feb 04 1447  Fri Feb 04, 2019  1310 72 yo F here with substernal chest pressure w/ diaphoresis. No active CP currently. DDx includes ACS, gastritis/esophagitis 2/2 NSAID use, less likely MSk pain. No clinical sx to suggest PE or dissection. Will check labs, imaging. HEART score is 4. Pain improved w/ nitro. Likely admit for high risk CP.   [CI]  1439 Trop neg, labs reassuring. Given concerning history with HEART score >3, will admit.   [CI]    Clinical Course User Index [CI] Duffy Bruce, MD     HEAR Score: 4   Final Clinical Impressions(s) / ED Diagnoses   Final diagnoses:  Chest pain, unspecified type    ED Discharge Orders    None       Duffy Bruce, MD 02/04/19 1448

## 2019-02-04 NOTE — ED Notes (Signed)
ED TO INPATIENT HANDOFF REPORT  ED Nurse Name and Phone #: Johnatha Zeidman [phone]  S Name/Age/Gender Christina Edwards 73 y.o. female Room/Bed: 046C/046C  Code Status   Code Status: Full Code  Home/SNF/Other Home Patient oriented to: self, place, time, and situation Is this baseline? Yes   Triage Complete: Triage complete  Chief Complaint CP  Triage Note [facility]- pt brought in from PCP. Pt went for evaluation of CP since Monday. Pt recently bought land and has been physically clearing trees. Pt states her CP is more of a pressure than pain. Pt received 324 ASA and 2 nitro.  110/80 76 HR 94% RA 97.9    Allergies Allergies  Allergen Reactions  . Sulfa Antibiotics Other (See Comments)    Reaction unknown  . Adhesive [Tape] Rash  . Neosporin [Neomycin-Bacitracin Zn-Polymyx] Rash    Level of Care/Admitting Diagnosis ED Disposition    ED Disposition Condition [facility] Area: [place] [[id number]]  Level of Care: Telemetry Cardiac [103]  I expect the patient will be discharged within 24 hours: Yes  LOW acuity---Tx typically complete <24 hrs---ACUTE conditions typically can be evaluated <24 hours---LABS likely to return to acceptable levels <24 hours---IS near functional baseline---EXPECTED to return to current living arrangement---NOT newly hypoxic: Meets criteria for 5C-Observation unit  Covid Evaluation: Screening Protocol (No Symptoms)  Diagnosis: Chest pain [270350]  Admitting Physician: Norval Morton [[id number]]  Attending Physician: Norval Morton [[id number]]  PT Class (Do Not Modify): Observation [104]  PT Acc Code (Do Not Modify): Observation [10022]       B Medical/Surgery History Past Medical History:  Diagnosis Date  . Breast cancer ([place]) 1987   Left breast intraductal  . Endometriosis   . GERD (gastroesophageal reflux disease)   . 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  Osteopenia 11/2018   T score -1.9 asked 9% / 1.4%  . Personal history of radiation therapy    Past Surgical History:  Procedure Laterality Date  . APPENDECTOMY  1954  . BREAST LUMPECTOMY Left 1987  . BREAST SURGERY  1987   Lumpectomy  . CERVICAL BIOPSY  W/ LOOP ELECTRODE EXCISION    . EYE SURGERY     laser to fix torn retina  . HAND SURGERY     left hand  . OOPHORECTOMY  1989   LSO  . TONSILLECTOMY  1955  . TUBAL LIGATION  1984     A IV Location/Drains/Wounds Patient Lines/Drains/Airways Status   Active Line/Drains/Airways    None          Intake/Output Last 24 hours No intake or output data in the 24 hours ending 02/04/19 1612  Labs/Imaging Results for orders placed or performed during the hospital encounter of 02/04/19 (from the past 48 hour(s))  CBC with Differential     Status: None   Collection Time: 02/04/19  1:33 PM  Result Value Ref Range   WBC 5.2 4.0 - 10.5 K/uL   RBC 4.10 3.87 - 5.11 MIL/uL   Hemoglobin 12.6 12.0 - 15.0 g/dL   HCT 40.0 36.0 - 46.0 %   MCV 97.6 80.0 - 100.0 fL   MCH 30.7 26.0 - 34.0 pg   MCHC 31.5 30.0 - 36.0 g/dL   RDW 13.4 11.5 - 15.5 %   Platelets 310 150 - 400 K/uL   nRBC 0.0 0.0 - 0.2 %   Neutrophils Relative %  66 %   Neutro Abs 3.4 1.7 - 7.7 K/uL   Lymphocytes Relative 26 %   Lymphs Abs 1.4 0.7 - 4.0 K/uL   Monocytes Relative 6 %   Monocytes Absolute 0.3 0.1 - 1.0 K/uL   Eosinophils Relative 2 %   Eosinophils Absolute 0.1 0.0 - 0.5 K/uL   Basophils Relative 0 %   Basophils Absolute 0.0 0.0 - 0.1 K/uL   Immature Granulocytes 0 %   Abs Immature Granulocytes 0.01 0.00 - 0.07 K/uL    Comment: Performed at [facility] [address] [place], [place] [address]  Comprehensive metabolic panel     Status: Abnormal   Collection Time: 02/04/19  1:33 PM  Result Value Ref Range   Sodium 139 135 - 145 mmol/L   Potassium 3.8 3.5 - 5.1 mmol/L   Chloride 107 98 - 111 mmol/L   CO2 23 22 - 32 mmol/L   Glucose, Bld 105 (H) 70  - 99 mg/dL   BUN 16 8 - 23 mg/dL   Creatinine, Ser 0.78 0.44 - 1.00 mg/dL   Calcium 9.3 8.9 - 10.3 mg/dL   Total Protein 7.2 6.5 - 8.1 g/dL   Albumin 4.1 3.5 - 5.0 g/dL   AST 24 15 - 41 U/L   ALT 23 0 - 44 U/L   Alkaline Phosphatase 85 38 - 126 U/L   Total Bilirubin 0.7 0.3 - 1.2 mg/dL   GFR calc non Af Amer >60 >60 mL/min   GFR calc Af Amer >60 >60 mL/min   Anion gap 9 5 - 15    Comment: Performed at [facility] [address] [place], [place] [address]  Troponin I - ONCE - STAT     Status: None   Collection Time: 02/04/19  1:33 PM  Result Value Ref Range   Troponin I <0.03 <0.03 ng/mL    Comment: Performed at [facility], [place] [address] [place], [place] [address]  Lipase, blood     Status: None   Collection Time: 02/04/19  1:33 PM  Result Value Ref Range   Lipase 29 11 - 51 U/L    Comment: Performed at [facility] [address], [place], [place] [address]   Dg Chest 2 View  Result Date: 02/04/2019 CLINICAL DATA:  73 year old female with lumpectomy and chest pain EXAM: CHEST - 2 VIEW COMPARISON:  08/22/2013 FINDINGS: Cardiomediastinal silhouette unchanged in size and contour. Surgical changes of the left axilla/chest. No pneumothorax or pleural effusion. No confluent airspace disease. No displaced fracture IMPRESSION: Chronic lung changes without evidence of acute cardiopulmonary disease Surgical changes of the left axilla/chest Electronically Signed   By: Corrie Mckusick D.O.   On: 02/04/2019 13:22    Pending Labs Unresulted Labs (From admission, onward)    Start     Ordered   02/05/19 0500  Lipid panel  Tomorrow morning,   R     02/04/19 1558   02/04/19 1557  Troponin I - Now Then Q6H  Now then every 6 hours,   R     02/04/19 1557          Vitals/Pain Today's Vitals   02/04/19 1236 02/04/19 1241 02/04/19 1450  BP:  (!) 138/95 137/63  Pulse:  63 75  Resp:  18 18  Temp:  97.9 F (36.6 C)   TempSrc:  Oral   SpO2:  100% 100%  Weight: 68 kg     Height: 5\' 9"  (  1.753 m)    PainSc: 1       Isolation Precautions No active isolations  Medications Medications  nitroGLYCERIN (NITROSTAT) SL tablet 0.4 mg (has no administration in time range)  acetaminophen (TYLENOL) tablet 650 mg (has no administration in time range)  ondansetron (ZOFRAN) injection 4 mg (has no administration in time range)  enoxaparin (LOVENOX) injection 40 mg (has no administration in time range)  morphine 2 MG/ML injection 2 mg (has no administration in time range)  alum & mag hydroxide-simeth (MAALOX/MYLANTA) 200-200-20 MG/5ML suspension 30 mL (has no administration in time range)    Mobility walks Low fall risk   Focused Assessments Cardiac Assessment Handoff:    Lab Results  Component Value Date   TROPONINI <0.03 02/04/2019   No results found for: DDIMER Does the Patient currently have chest pain? No     R Recommendations: See Admitting Provider Note  Report given to:   Additional Notes:

## 2019-02-05 ENCOUNTER — Observation Stay (HOSPITAL_BASED_OUTPATIENT_CLINIC_OR_DEPARTMENT_OTHER): Payer: Medicare Other

## 2019-02-05 ENCOUNTER — Other Ambulatory Visit: Payer: Self-pay | Admitting: Student

## 2019-02-05 DIAGNOSIS — R0789 Other chest pain: Secondary | ICD-10-CM | POA: Diagnosis not present

## 2019-02-05 DIAGNOSIS — Z79899 Other long term (current) drug therapy: Secondary | ICD-10-CM | POA: Diagnosis not present

## 2019-02-05 DIAGNOSIS — Z20828 Contact with and (suspected) exposure to other viral communicable diseases: Secondary | ICD-10-CM | POA: Diagnosis not present

## 2019-02-05 DIAGNOSIS — I361 Nonrheumatic tricuspid (valve) insufficiency: Secondary | ICD-10-CM

## 2019-02-05 DIAGNOSIS — R079 Chest pain, unspecified: Secondary | ICD-10-CM | POA: Diagnosis not present

## 2019-02-05 DIAGNOSIS — E785 Hyperlipidemia, unspecified: Secondary | ICD-10-CM

## 2019-02-05 DIAGNOSIS — I340 Nonrheumatic mitral (valve) insufficiency: Secondary | ICD-10-CM

## 2019-02-05 DIAGNOSIS — Z853 Personal history of malignant neoplasm of breast: Secondary | ICD-10-CM | POA: Diagnosis not present

## 2019-02-05 LAB — LIPID PANEL
Cholesterol: 191 mg/dL (ref 0–200)
HDL: 46 mg/dL (ref >40)
LDL Cholesterol: 126 mg/dL — ABNORMAL HIGH (ref 0–99)
Total CHOL/HDL Ratio: 4.2 RATIO
Triglycerides: 95 mg/dL (ref <150)
VLDL: 19 mg/dL (ref 0–40)

## 2019-02-05 LAB — ECHOCARDIOGRAM COMPLETE
Height: 69 in
Weight: 2384 oz

## 2019-02-05 LAB — TROPONIN I
Troponin I: <0.03 ng/mL (ref <0.03)
Troponin I: <0.03 ng/mL (ref <0.03)

## 2019-02-05 MED ORDER — ATORVASTATIN CALCIUM 40 MG PO TABS: 40.0000 mg | ORAL_TABLET | Freq: Every day | ORAL | 0 refills | Status: DC

## 2019-02-05 MED ORDER — ATORVASTATIN CALCIUM 40 MG PO TABS: 40.0000 mg | ORAL_TABLET | Freq: Every day | ORAL | Status: DC

## 2019-02-05 MED ORDER — ASPIRIN 81 MG PO CHEW
81.0000 mg | CHEWABLE_TABLET | Freq: Every day | ORAL | Status: DC
  Administered 2019-02-05: 81 mg via ORAL
  Filled 2019-02-05: qty 1

## 2019-02-05 MED ORDER — ASPIRIN 81 MG PO CHEW: 81.0000 mg | CHEWABLE_TABLET | Freq: Every day | ORAL | Status: DC

## 2019-02-05 NOTE — Discharge Instructions (Signed)
Heart-Healthy Eating Plan Many factors influence your heart (coronary) health, including eating and exercise habits. Coronary risk increases with abnormal blood fat (lipid) levels. Heart-healthy meal planning includes limiting unhealthy fats, increasing healthy fats, and making other diet and lifestyle changes. What is my plan? Your health care provider may recommend that you:  Limit your fat intake to _________% or less of your total calories each day.  Limit your saturated fat intake to _________% or less of your total calories each day.  Limit the amount of cholesterol in your diet to less than _________ mg per day. What are tips for following this plan? Cooking Cook foods using methods other than frying. Baking, boiling, grilling, and broiling are all good options. Other ways to reduce fat include:  Removing the skin from poultry.  Removing all visible fats from meats.  Steaming vegetables in water or broth. Meal planning   At meals, imagine dividing your plate into fourths: ? Fill one-half of your plate with vegetables and green salads. ? Fill one-fourth of your plate with whole grains. ? Fill one-fourth of your plate with lean protein foods.  Eat 4-5 servings of vegetables per day. One serving equals 1 cup raw or cooked vegetable, or 2 cups raw leafy greens.  Eat 4-5 servings of fruit per day. One serving equals 1 medium whole fruit,  cup dried fruit,  cup fresh, frozen, or canned fruit, or  cup 100% fruit juice.  Eat more foods that contain soluble fiber. Examples include apples, broccoli, carrots, beans, peas, and barley. Aim to get 25-30 g of fiber per day.  Increase your consumption of legumes, nuts, and seeds to 4-5 servings per week. One serving of dried beans or legumes equals  cup cooked, 1 serving of nuts is  cup, and 1 serving of seeds equals 1 tablespoon. Fats  Choose healthy fats more often. Choose monounsaturated and polyunsaturated fats, such as olive and  canola oils, flaxseeds, walnuts, almonds, and seeds.  Eat more omega-3 fats. Choose salmon, mackerel, sardines, tuna, flaxseed oil, and ground flaxseeds. Aim to eat fish at least 2 times each week.  Check food labels carefully to identify foods with trans fats or high amounts of saturated fat.  Limit saturated fats. These are found in animal products, such as meats, butter, and cream. Plant sources of saturated fats include palm oil, palm kernel oil, and coconut oil.  Avoid foods with partially hydrogenated oils in them. These contain trans fats. Examples are stick margarine, some tub margarines, cookies, crackers, and other baked goods.  Avoid fried foods. General information  Eat more home-cooked food and less restaurant, buffet, and fast food.  Limit or avoid alcohol.  Limit foods that are high in starch and sugar.  Lose weight if you are overweight. Losing just 5-10% of your body weight can help your overall health and prevent diseases such as diabetes and heart disease.  Monitor your salt (sodium) intake, especially if you have high blood pressure. Talk with your health care provider about your sodium intake.  Try to incorporate more vegetarian meals weekly. What foods can I eat? Fruits All fresh, canned (in natural juice), or frozen fruits. Vegetables Fresh or frozen vegetables (raw, steamed, roasted, or grilled). Green salads. Grains Most grains. Choose whole wheat and whole grains most of the time. Rice and pasta, including brown rice and pastas made with whole wheat. Meats and other proteins Lean, well-trimmed beef, veal, pork, and lamb. Chicken and [place] without skin. All fish and shellfish. Wild  duck, rabbit, pheasant, and venison. Egg whites or low-cholesterol egg substitutes. Dried beans, peas, lentils, and tofu. Seeds and most nuts. Dairy Low-fat or nonfat cheeses, including ricotta and mozzarella. Skim or 1% milk (liquid, powdered, or evaporated). Buttermilk made  with low-fat milk. Nonfat or low-fat yogurt. Fats and oils Non-hydrogenated (trans-free) margarines. Vegetable oils, including soybean, sesame, sunflower, olive, peanut, safflower, corn, canola, and cottonseed. Salad dressings or mayonnaise made with a vegetable oil. Beverages Water (mineral or sparkling). Coffee and tea. Diet carbonated beverages. Sweets and desserts Sherbet, gelatin, and fruit ice. Small amounts of dark chocolate. Limit all sweets and desserts. Seasonings and condiments All seasonings and condiments. The items listed above may not be a complete list of foods and beverages you can eat. Contact a dietitian for more options. What foods are not recommended? Fruits Canned fruit in heavy syrup. Fruit in cream or butter sauce. Fried fruit. Limit coconut. Vegetables Vegetables cooked in cheese, cream, or butter sauce. Fried vegetables. Grains Breads made with saturated or trans fats, oils, or whole milk. Croissants. Sweet rolls. Donuts. High-fat crackers, such as cheese crackers. Meats and other proteins Fatty meats, such as hot dogs, ribs, sausage, bacon, rib-eye roast or steak. High-fat deli meats, such as salami and bologna. Caviar. Domestic duck and goose. Organ meats, such as liver. Dairy Cream, sour cream, cream cheese, and creamed cottage cheese. Whole milk cheeses. Whole or 2% milk (liquid, evaporated, or condensed). Whole buttermilk. Cream sauce or high-fat cheese sauce. Whole-milk yogurt. Fats and oils Meat fat, or shortening. Cocoa butter, hydrogenated oils, palm oil, coconut oil, palm kernel oil. Solid fats and shortenings, including bacon fat, salt pork, lard, and butter. Nondairy cream substitutes. Salad dressings with cheese or sour cream. Beverages Regular sodas and any drinks with added sugar. Sweets and desserts Frosting. Pudding. Cookies. Cakes. Pies. Milk chocolate or white chocolate. Buttered syrups. Full-fat ice cream or ice cream drinks. The items listed  above may not be a complete list of foods and beverages to avoid. Contact a dietitian for more information. Summary  Heart-healthy meal planning includes limiting unhealthy fats, increasing healthy fats, and making other diet and lifestyle changes.  Lose weight if you are overweight. Losing just 5-10% of your body weight can help your overall health and prevent diseases such as diabetes and heart disease.  Focus on eating a balance of foods, including fruits and vegetables, low-fat or nonfat dairy, lean protein, nuts and legumes, whole grains, and heart-healthy oils and fats. This information is not intended to replace advice given to you by your health care provider. Make sure you discuss any questions you have with your health care provider. Document Released: 06/17/2008 Document Revised: 10/16/2017 Document Reviewed: 10/16/2017 Elsevier Interactive Patient Education  2019 Elsevier Inc. Atorvastatin tablets What is this medicine? ATORVASTATIN (a TORE va sta tin) is known as a HMG-CoA reductase inhibitor or 'statin'. It lowers the level of cholesterol and triglycerides in the blood. This drug may also reduce the risk of heart attack, stroke, or other health problems in patients with risk factors for heart disease. Diet and lifestyle changes are often used with this drug. This medicine may be used for other purposes; ask your health care provider or pharmacist if you have questions. COMMON BRAND NAME(S): Lipitor What should I tell my health care provider before I take this medicine? They need to know if you have any of these conditions: -diabetes -if you often drink alcohol -history of stroke -kidney disease -liver disease -muscle aches or weakness -thyroid  disease -an unusual or allergic reaction to atorvastatin, other medicines, foods, dyes, or preservatives -pregnant or trying to get pregnant -breast-feeding How should I use this medicine? Take this medicine by mouth with a glass of  water. Follow the directions on the prescription label. You can take it with or without food. If it upsets your stomach, take it with food. Yoshie Kosel not take with grapefruit juice. Take your medicine at regular intervals. Tanazia Achee not take it more often than directed. Tamiko Leopard not stop taking except on your doctor's advice. Talk to your pediatrician regarding the use of this medicine in children. While this drug may be prescribed for children as young as 10 for selected conditions, precautions Latorsha Curling apply. Overdosage: If you think you have taken too much of this medicine contact a poison control center or emergency room at once. NOTE: This medicine is only for you. Zaylynn Rickett not share this medicine with others. What if I miss a dose? If you miss a dose, take it as soon as you can. If your next dose is to be taken in less than 12 hours, then Mehreen Azizi not take the missed dose. Take the next dose at your regular time. Tryson Lumley not take double or extra doses. What may interact with this medicine? Jerald Villalona not take this medicine with any of the following medications: -dasabuvir; ombitasvir; paritaprevir; ritonavir -ombitasvir; paritaprevir; ritonavir -posaconazole -red yeast rice This medicine may also interact with the following medications: -alcohol -birth control pills -certain antibiotics like erythromycin and clarithromycin -certain antivirals for HIV or hepatitis -certain medicines for cholesterol like fenofibrate, gemfibrozil, and niacin -certain medicines for fungal infections like ketoconazole and itraconazole -colchicine -cyclosporine -digoxin -grapefruit juice -rifampin This list may not describe all possible interactions. Give your health care provider a list of all the medicines, herbs, non-prescription drugs, or dietary supplements you use. Also tell them if you smoke, drink alcohol, or use illegal drugs. Some items may interact with your medicine. What should I watch for while using this medicine? Visit your doctor or health  care professional for regular check-ups. You may need regular tests to make sure your liver is working properly. Your health care professional may tell you to stop taking this medicine if you develop muscle problems. If your muscle problems Aslyn Cottman not go away after stopping this medicine, contact your health care professional. Shantika Bermea not become pregnant while taking this medicine. Women should inform their health care professional if they wish to become pregnant or think they might be pregnant. There is a potential for serious side effects to an unborn child. Talk to your health care professional or pharmacist for more information. Aprille Sawhney not breast-feed an infant while taking this medicine. This medicine may affect blood sugar levels. If you have diabetes, check with your doctor or health care professional before you change your diet or the dose of your diabetic medicine. If you are going to need surgery or other procedure, tell your doctor that you are using this medicine. This drug is only part of a total heart-health program. Your doctor or a dietician can suggest a low-cholesterol and low-fat diet to help. Avoid alcohol and smoking, and keep a proper exercise schedule. This medicine may cause a decrease in Co-Enzyme Q-10. You should make sure that you get enough Co-Enzyme Q-10 while you are taking this medicine. Discuss the foods you eat and the vitamins you take with your health care professional. What side effects may I notice from receiving this medicine? Side effects that you should report to  your doctor or health care professional as soon as possible: -allergic reactions like skin rash, itching or hives, swelling of the face, lips, or tongue -fever -joint pain -loss of memory -redness, blistering, peeling or loosening of the skin, including inside the mouth -signs and symptoms of liver injury like dark yellow or brown urine; general ill feeling or flu-like symptoms; light-belly pain; unusually weak or  tired; yellowing of the eyes or skin -signs and symptoms of muscle injury like dark urine; trouble passing urine or change in the amount of urine; unusually weak or tired; muscle pain or side or back pain Side effects that usually Huntley Demedeiros not require medical attention (report to your doctor or health care professional if they continue or are bothersome): -diarrhea -nausea -stomach pain -trouble sleeping -upset stomach This list may not describe all possible side effects. Call your doctor for medical advice about side effects. You may report side effects to FDA at 1-800-FDA-1088. Where should I keep my medicine? Keep out of the reach of children. Store between 20 and 25 degrees C (68 and 77 degrees F). Throw away any unused medicine after the expiration date. NOTE: This sheet is a summary. It may not cover all possible information. If you have questions about this medicine, talk to your doctor, pharmacist, or health care provider.  2019 Elsevier/Gold Standard (2018-01-08 11:36:48)

## 2019-02-05 NOTE — Progress Notes (Signed)
DAILY PROGRESS NOTE   Patient Name: Christina Edwards Date of Encounter: 02/05/2019 Cardiologist: No primary care provider on file.  Chief Complaint   No further chest pain  Patient Profile   Christina Edwards is a 73 y.o. female with a hx of breast cancer, GERD, osteopenia who is being seen today for the evaluation of chest pain at the request of Dr. Fuller Plan.  Subjective   Ruled-out for NSTEMI overnight. No further chest pain. Plan per Dr. Harrell Gave for outpatient coronary CT angiogram. LDL 126. BP normal today.  Objective   Vitals:   02/04/19 1450 02/04/19 1652 02/04/19 2137 02/05/19 0822  BP: 137/63 (!) 151/91 125/65 127/68  Pulse: 75 73 67 72  Resp: 18 18  18   Temp:  98.3 F (36.8 C) 98.2 F (36.8 C) 98 F (36.7 C)  TempSrc:  Oral Oral Oral  SpO2: 100% 98% 96% 100%  Weight:  67.6 kg    Height:  5\' 9"  (1.753 m)      Intake/Output Summary (Last 24 hours) at 02/05/2019 0853 Last data filed at 02/04/2019 1900 Gross per 24 hour  Intake 240 ml  Output -  Net 240 ml   Filed Weights   02/04/19 1236 02/04/19 1652  Weight: 68 kg 67.6 kg    Physical Exam   General appearance: alert and no distress Lungs: clear to auscultation bilaterally Heart: regular rate and rhythm, S1, S2 normal, no murmur, click, rub or gallop Extremities: extremities normal, atraumatic, no cyanosis or edema Neurologic: Alert and oriented X 3, normal strength and tone. Normal symmetric reflexes. Normal coordination and gait  Inpatient Medications    Scheduled Meds: . enoxaparin (LOVENOX) injection  40 mg Subcutaneous Q24H  . loratadine  10 mg Oral Daily    Continuous Infusions:   PRN Meds: acetaminophen, morphine injection, nitroGLYCERIN, ondansetron (ZOFRAN) IV   Labs   Results for orders placed or performed during the hospital encounter of 02/04/19 (from the past 48 hour(s))  CBC with Differential     Status: None   Collection Time: 02/04/19  1:33 PM  Result  Value Ref Range   WBC 5.2 4.0 - 10.5 K/uL   RBC 4.10 3.87 - 5.11 MIL/uL   Hemoglobin 12.6 12.0 - 15.0 g/dL   HCT 40.0 36.0 - 46.0 %   MCV 97.6 80.0 - 100.0 fL   MCH 30.7 26.0 - 34.0 pg   MCHC 31.5 30.0 - 36.0 g/dL   RDW 13.4 11.5 - 15.5 %   Platelets 310 150 - 400 K/uL   nRBC 0.0 0.0 - 0.2 %   Neutrophils Relative % 66 %   Neutro Abs 3.4 1.7 - 7.7 K/uL   Lymphocytes Relative 26 %   Lymphs Abs 1.4 0.7 - 4.0 K/uL   Monocytes Relative 6 %   Monocytes Absolute 0.3 0.1 - 1.0 K/uL   Eosinophils Relative 2 %   Eosinophils Absolute 0.1 0.0 - 0.5 K/uL   Basophils Relative 0 %   Basophils Absolute 0.0 0.0 - 0.1 K/uL   Immature Granulocytes 0 %   Abs Immature Granulocytes 0.01 0.00 - 0.07 K/uL    Comment: Performed at [facility], [address] [place], [place] [address]  Comprehensive metabolic panel     Status: Abnormal   Collection Time: 02/04/19  1:33 PM  Result Value Ref Range   Sodium 139 135 - 145 mmol/L   Potassium 3.8 3.5 - 5.1 mmol/L   Chloride 107 98 -  111 mmol/L   CO2 23 22 - 32 mmol/L   Glucose, Bld 105 (H) 70 - 99 mg/dL   BUN 16 8 - 23 mg/dL   Creatinine, Ser 0.78 0.44 - 1.00 mg/dL   Calcium 9.3 8.9 - 10.3 mg/dL   Total Protein 7.2 6.5 - 8.1 g/dL   Albumin 4.1 3.5 - 5.0 g/dL   AST 24 15 - 41 U/L   ALT 23 0 - 44 U/L   Alkaline Phosphatase 85 38 - 126 U/L   Total Bilirubin 0.7 0.3 - 1.2 mg/dL   GFR calc non Af Amer >60 >60 mL/min   GFR calc Af Amer >60 >60 mL/min   Anion gap 9 5 - 15    Comment: Performed at [facility], [place] [address], [place], [place] [address]  Troponin I - ONCE - STAT     Status: None   Collection Time: 02/04/19  1:33 PM  Result Value Ref Range   Troponin I <0.03 <0.03 ng/mL    Comment: Performed at [facility], [place] [address], [place], [place] [address]  Lipase, blood     Status: None   Collection Time: 02/04/19  1:33 PM  Result Value Ref Range   Lipase 29 11 - 51 U/L    Comment: Performed at [place] [address] [place], [place] [address]  SARS Coronavirus 2 [facility] order, Performed in [facility] hospital lab)     Status: None   Collection Time: 02/04/19  6:35 PM  Result Value Ref Range   SARS Coronavirus 2 NEGATIVE NEGATIVE    Comment: (NOTE) If result is NEGATIVE SARS-CoV-2 target nucleic acids are NOT DETECTED. The SARS-CoV-2 RNA is generally detectable in upper and lower  respiratory specimens during the acute phase of infection. The lowest  concentration of SARS-CoV-2 viral copies this assay can detect is 250  copies / mL. A negative result does not preclude SARS-CoV-2 infection  and should not be used as the sole basis for treatment or other  patient management decisions.  A negative result may occur with  improper specimen collection / handling, submission of specimen other  than nasopharyngeal swab, presence of viral mutation(s) within the  areas targeted by this assay, and inadequate number of viral copies  (<250 copies / mL). A negative result must be combined with clinical  observations, patient history, and epidemiological information. If result is POSITIVE SARS-CoV-2 target nucleic acids are DETECTED. The SARS-CoV-2 RNA is generally detectable in upper and lower  respiratory specimens dur ing the acute phase of infection.  Positive  results are indicative of active infection with SARS-CoV-2.  Clinical  correlation with patient history and other diagnostic information is  necessary to determine patient infection status.  Positive results do  not rule out bacterial infection or co-infection with other viruses. If result is PRESUMPTIVE POSTIVE SARS-CoV-2 nucleic acids MAY BE PRESENT.   A presumptive positive result was obtained on the submitted specimen  and confirmed on repeat testing.  While 2019 novel coronavirus  (SARS-CoV-2) nucleic acids may be present in the submitted sample  additional confirmatory testing may be necessary for epidemiological  and / or  clinical management purposes  to differentiate between  SARS-CoV-2 and other Sarbecovirus currently known to infect humans.  If clinically indicated additional testing with an alternate test  methodology [phone]) is advised. The SARS-CoV-2 RNA is generally  detectable in upper and lower respiratory sp ecimens during the acute  phase of  infection. The expected result is Negative. Fact Sheet for Patients:  StrictlyIdeas.no Fact Sheet for Healthcare Providers: BankingDealers.co.za This test is not yet approved or cleared by the [place] FDA and has been authorized for detection and/or diagnosis of SARS-CoV-2 by FDA under an Emergency Use Authorization (EUA).  This EUA will remain in effect (meaning this test can be used) for the duration of the COVID-19 declaration under Section 564(b)(1) of the Act, 21 U.S.C. section 360bbb-3(b)(1), unless the authorization is terminated or revoked sooner. Performed at [facility], [place] [address], [place], [place] [address]   Troponin I - Now Then Q6H     Status: None   Collection Time: 02/04/19  7:19 PM  Result Value Ref Range   Troponin I <0.03 <0.03 ng/mL    Comment: Performed at [place] [address] [place], [place] [address]  Troponin I - Now Then Q6H     Status: None   Collection Time: 02/05/19  2:13 AM  Result Value Ref Range   Troponin I <0.03 <0.03 ng/mL    Comment: Performed at [place] [address], [place], [place] [address]  Lipid panel     Status: Abnormal   Collection Time: 02/05/19  2:13 AM  Result Value Ref Range   Cholesterol 191 0 - 200 mg/dL   Triglycerides 95 <150 mg/dL   HDL 46 >40 mg/dL   Total CHOL/HDL Ratio 4.2 RATIO   VLDL 19 0 - 40 mg/dL   LDL Cholesterol 126 (H) 0 - 99 mg/dL    Comment:        Total Cholesterol/HDL:CHD Risk Coronary Heart Disease Risk Table                     Men   Women  1/2 Average Risk   3.4   3.3  Average  Risk       5.0   4.4  2 X Average Risk   9.6   7.1  3 X Average Risk  23.4   11.0        Use the calculated Patient Ratio above and the CHD Risk Table to determine the patient's CHD Risk.        ATP III CLASSIFICATION (LDL):  <100     mg/dL   Optimal  100-129  mg/dL   Near or Above                    Optimal  130-159  mg/dL   Borderline  160-189  mg/dL   High  >190     mg/dL   Very High Performed at [place],  [address]     ECG   NSR at 68 - Personally Reviewed  Telemetry   Sinus rhythm - Personally Reviewed  Radiology    Dg Chest 2 View  Result Date: 02/04/2019 CLINICAL DATA:  73 year old female with lumpectomy and chest pain EXAM: CHEST - 2 VIEW COMPARISON:  08/22/2013 FINDINGS: Cardiomediastinal silhouette unchanged in size and contour. Surgical changes of the left axilla/chest. No pneumothorax or pleural effusion. No confluent airspace disease. No displaced fracture IMPRESSION: Chronic lung changes without evidence of acute cardiopulmonary disease Surgical changes of the left axilla/chest Electronically Signed   By: Corrie Mckusick D.O.   On: 02/04/2019 13:22    Cardiac Studies   N/A  Assessment   1. Principal Problem: 2.   Chest pain 3. Active Problems:  4.   History of breast cancer 5.   Plan   1. No further chest pain. Ruled-out for ACS. I agree she is low risk for discharge. Recommend low dose aspirin 81 mg daily and atorvastatin 40 mg daily to empirically target LDL<70. We will arrange for outpatient CT coronary angiogram and follow-up with Dr. Harrell Gave at [facility]. [place] for d/c today.  Time Spent Directly with Patient:  I have spent a total of 25 minutes with the patient reviewing hospital notes, telemetry, EKGs, labs and examining the patient as well as establishing an assessment and plan that was discussed personally with the patient.  > 50% of time was spent in direct patient care.  Length of Stay:  LOS: 0  days   Pixie Casino, MD, [facility], [place] Director of the [facility] Lipid Disorders &  Cardiovascular Risk Reduction Clinic Diplomate of the American Board of Clinical Lipidology Attending Cardiologist  Direct Dial: [phone]  Fax: [phone]  Website:  [email or web address] 02/05/2019, 8:53 AM

## 2019-02-05 NOTE — Progress Notes (Signed)
c 

## 2019-02-05 NOTE — Plan of Care (Signed)
  Problem: Education: Goal: Knowledge of General Education information will improve Description Including pain rating scale, medication(s)/side effects and non-pharmacologic comfort measures Outcome: Progressing   Problem: Health Behavior/Discharge Planning: Goal: Ability to manage health-related needs will improve Outcome: Progressing   

## 2019-02-05 NOTE — Discharge Summary (Signed)
Physician Discharge Summary  Christina Edwards Phylisha Dix IDP:[id number] DOB: 1946/07/05 DOA: 02/04/2019  PCP: Carol Ada, MD  Admit date: 02/04/2019 Discharge date: 02/05/2019  Time spent: 35 minutes  Recommendations for Outpatient Follow-up:  [facility] heart care next week for coronary CTA  Discharge Diagnoses:  Principal Problem:   Chest pain   Dyslipidemia   History of breast cancer   Discharge Condition: Stable  Diet recommendation: Heart healthy  Filed Weights   02/04/19 1236 02/04/19 1652  Weight: 68 kg 67.6 kg    History of present illness:  [name] is a 73 y.o. female with medical history significant of breast cancer s/p lumpectomy and radiation; who presents with complaints of chest pain. 3 weeks ago her and her husband [place] acres [place].  For the last 2 weeks they have been cleaning up area and she reports that she has been doing a lot of lifting.  Started having a dull substernal pain approximately 5 days ago.    Last couple of days this was more pressure-like.  Hospital Course:   Atypical chest pain with some typical symptoms -EKG without ST-T wave changes, troponin was negative x3 -Seen by cardiology in consultation yesterday and today they recommended outpatient coronary CTA, she was discharged home over the weekend, see [facility] heart care office will call the patient early next week to schedule her CTA. -No recurrence of symptoms while in the hospital -Discharged home on aspirin 81 mg daily daily and atorvastatin 40 mg daily, LDL cholesterol was 126  Discharge Exam: Vitals:   02/05/19 0822 02/05/19 0841  BP: 127/68 139/79  Pulse: 72   Resp: 18   Temp: 98 F (36.7 C)   SpO2: 100%     General: AAOx3 Cardiovascular: S1S2/RRR Respiratory: CTAB  Discharge Instructions   Discharge Instructions    Diet - low sodium heart healthy   Complete by:  As directed    Increase activity slowly   Complete by:  As directed      Allergies as of  02/05/2019      Reactions   Sulfa Antibiotics Other (See Comments)   Reaction unknown   Adhesive [tape] Rash   Neosporin [neomycin-bacitracin Zn-polymyx] Rash      Medication List    STOP taking these medications   acetaminophen 500 MG tablet Commonly known as:  TYLENOL     TAKE these medications   ALKA SELTZER PLUS PO Take 1 tablet by mouth daily as needed (runny nose).   aspirin 81 MG chewable tablet Chew 1 tablet (81 mg total) by mouth daily.   atorvastatin 40 MG tablet Commonly known as:  LIPITOR Take 1 tablet (40 mg total) by mouth daily at 6 PM.   ibuprofen 200 MG tablet Commonly known as:  ADVIL Take 200 mg by mouth every 8 (eight) hours as needed for mild pain.   loratadine 10 MG tablet Commonly known as:  CLARITIN Take 10 mg by mouth daily.   TUMS PLUS PO Take 2 tablets by mouth daily as needed (heartburn).   Valtrex 500 MG tablet Generic drug:  valACYclovir Take 500 mg by mouth 2 (two) times daily as needed (outbreak for 7 days).      Allergies  Allergen Reactions  . Sulfa Antibiotics Other (See Comments)    Reaction unknown  . Adhesive [Tape] Rash  . Neosporin [Neomycin-Bacitracin Zn-Polymyx] Rash   Follow-up Information    Buford Dresser, MD Follow up.   Specialty:  Cardiology Why:  Cardiology Office will call you for Coronary CTA Contact information: [address] [place] [address] [phone]            The results of significant diagnostics from this hospitalization (including imaging, microbiology, ancillary and laboratory) are listed below for reference.    Significant Diagnostic Studies: Dg Chest 2 View  Result Date: 02/04/2019 CLINICAL DATA:  73 year old female with lumpectomy and chest pain EXAM: CHEST - 2 VIEW COMPARISON:  08/22/2013 FINDINGS: Cardiomediastinal silhouette unchanged in size and contour. Surgical changes of the left axilla/chest. No pneumothorax or pleural effusion. No confluent airspace  disease. No displaced fracture IMPRESSION: Chronic lung changes without evidence of acute cardiopulmonary disease Surgical changes of the left axilla/chest Electronically Signed   By: Corrie Mckusick D.O.   On: 02/04/2019 13:22    Microbiology: Recent Results (from the past 240 hour(s))  SARS Coronavirus 2 [facility] order, Performed in [place] hospital lab)     Status: None   Collection Time: 02/04/19  6:35 PM  Result Value Ref Range Status   SARS Coronavirus 2 NEGATIVE NEGATIVE Final    Comment: (NOTE) If result is NEGATIVE SARS-CoV-2 target nucleic acids are NOT DETECTED. The SARS-CoV-2 RNA is generally detectable in upper and lower  respiratory specimens during the acute phase of infection. The lowest  concentration of SARS-CoV-2 viral copies this assay can detect is 250  copies / mL. A negative result does not preclude SARS-CoV-2 infection  and should not be used as the sole basis for treatment or other  patient management decisions.  A negative result may occur with  improper specimen collection / handling, submission of specimen other  than nasopharyngeal swab, presence of viral mutation(s) within the  areas targeted by this assay, and inadequate number of viral copies  (<250 copies / mL). A negative result must be combined with clinical  observations, patient history, and epidemiological information. If result is POSITIVE SARS-CoV-2 target nucleic acids are DETECTED. The SARS-CoV-2 RNA is generally detectable in upper and lower  respiratory specimens dur ing the acute phase of infection.  Positive  results are indicative of active infection with SARS-CoV-2.  Clinical  correlation with patient history and other diagnostic information is  necessary to determine patient infection status.  Positive results do  not rule out bacterial infection or co-infection with other viruses. If result is PRESUMPTIVE POSTIVE SARS-CoV-2 nucleic acids MAY BE PRESENT.   A presumptive positive  result was obtained on the submitted specimen  and confirmed on repeat testing.  While 2019 novel coronavirus  (SARS-CoV-2) nucleic acids may be present in the submitted sample  additional confirmatory testing may be necessary for epidemiological  and / or clinical management purposes  to differentiate between  SARS-CoV-2 and other Sarbecovirus currently known to infect humans.  If clinically indicated additional testing with an alternate test  methodology [phone]) is advised. The SARS-CoV-2 RNA is generally  detectable in upper and lower respiratory sp ecimens during the acute  phase of infection. The expected result is Negative. Fact Sheet for Patients:  StrictlyIdeas.no Fact Sheet for Healthcare Providers: BankingDealers.co.za This test is not yet approved or cleared by the [place] FDA and has been authorized for detection and/or diagnosis of SARS-CoV-2 by FDA under an Emergency Use Authorization (EUA).  This EUA will remain in effect (meaning this test can be used) for the duration of the COVID-19 declaration under Section 564(b)(1) of the Act, 21 U.S.C. section 360bbb-3(b)(1), unless the authorization is terminated or revoked sooner.  Performed at [facility], [place] [address], [place], [place] [address]      Labs: Basic Metabolic Panel: Recent Labs  Lab 02/04/19 1333  NA 139  K 3.8  CL 107  CO2 23  GLUCOSE 105*  BUN 16  CREATININE 0.78  CALCIUM 9.3   Liver Function Tests: Recent Labs  Lab 02/04/19 1333  AST 24  ALT 23  ALKPHOS 85  BILITOT 0.7  PROT 7.2  ALBUMIN 4.1   Recent Labs  Lab 02/04/19 1333  LIPASE 29   No results for input(s): AMMONIA in the last 168 hours. CBC: Recent Labs  Lab 02/04/19 1333  WBC 5.2  NEUTROABS 3.4  HGB 12.6  HCT 40.0  MCV 97.6  PLT 310   Cardiac Enzymes: Recent Labs  Lab 02/04/19 1333 02/04/19 1919 02/05/19 0213 02/05/19 0712  TROPONINI <0.03 <0.03  <0.03 <0.03   BNP: BNP (last 3 results) No results for input(s): BNP in the last 8760 hours.  ProBNP (last 3 results) No results for input(s): PROBNP in the last 8760 hours.  CBG: No results for input(s): GLUCAP in the last 168 hours.     Signed:  Domenic Polite MD.  [facility] 02/05/2019, 2:43 PM

## 2019-02-05 NOTE — Progress Notes (Signed)
  Echocardiogram 2D Echocardiogram has been performed.  Christina Edwards 02/05/2019, 10:20 AM

## 2019-02-14 ENCOUNTER — Telehealth (HOSPITAL_COMMUNITY): Payer: Self-pay | Admitting: Emergency Medicine

## 2019-02-14 NOTE — Telephone Encounter (Signed)
Reaching out to patient to offer assistance regarding upcoming cardiac imaging study; pt verbalizes understanding of appt date/time, parking situation and where to check in, pre-test NPO status and medications ordered, and verified current allergies; name and call back number provided for further questions should they arise Tajia Szeliga RN Navigator Cardiac Imaging [facility] [phone] office [phone] cell  Pt denies covid symptoms, verbalized understanding of visitor policy. 

## 2019-02-15 ENCOUNTER — Other Ambulatory Visit: Payer: Self-pay

## 2019-02-15 ENCOUNTER — Ambulatory Visit (HOSPITAL_COMMUNITY)
Admission: RE | Admit: 2019-02-15 | Discharge: 2019-02-15 | Disposition: A | Discharge status: Home / Self Care | Payer: Medicare Other | Source: Ambulatory Visit | Attending: Student | Admitting: Student

## 2019-02-15 ENCOUNTER — Ambulatory Visit (HOSPITAL_COMMUNITY): Payer: Medicare Other

## 2019-02-15 DIAGNOSIS — R079 Chest pain, unspecified: Secondary | ICD-10-CM | POA: Insufficient documentation

## 2019-02-15 DIAGNOSIS — I2510 Atherosclerotic heart disease of native coronary artery without angina pectoris: Secondary | ICD-10-CM | POA: Insufficient documentation

## 2019-02-15 DIAGNOSIS — I200 Unstable angina: Secondary | ICD-10-CM | POA: Diagnosis not present

## 2019-02-15 MED ORDER — METOPROLOL TARTRATE 5 MG/5ML IV SOLN
INTRAVENOUS | Status: AC
  Filled 2019-02-15: qty 5

## 2019-02-15 MED ORDER — NITROGLYCERIN 0.4 MG SL SUBL
0.8000 mg | SUBLINGUAL_TABLET | Freq: Once | SUBLINGUAL | Status: AC
  Administered 2019-02-15: 0.8 mg via SUBLINGUAL

## 2019-02-15 MED ORDER — METOPROLOL TARTRATE 5 MG/5ML IV SOLN: 5.0000 mg | INTRAVENOUS | Status: DC | PRN

## 2019-02-15 MED ORDER — NITROGLYCERIN 0.4 MG SL SUBL
SUBLINGUAL_TABLET | SUBLINGUAL | Status: AC
  Filled 2019-02-15: qty 2

## 2019-02-15 MED ORDER — IOHEXOL 350 MG/ML SOLN
80.0000 mL | Freq: Once | INTRAVENOUS | Status: AC | PRN
  Administered 2019-02-15: 80 mL via INTRAVENOUS

## 2019-02-16 ENCOUNTER — Telehealth: Payer: Self-pay | Admitting: Cardiology

## 2019-02-16 NOTE — Telephone Encounter (Signed)
Coronary CT from yesterday has not yet been read by cardiology. Echo from 5/16 did not have any major abnormalities. When the read from the CT is back I will let her know. Thanks!

## 2019-02-16 NOTE — Telephone Encounter (Signed)
New Message    Pt is calling about results from her Echo she had in the hospital and her CT    Please call

## 2019-02-16 NOTE — Telephone Encounter (Signed)
Patient is requesting results from 5/26 coronary CT and echo completed in hospital in advance of her 6/1 appointment so she does not have to wait. Advised will route to MD & RN

## 2019-02-17 ENCOUNTER — Telehealth: Payer: Self-pay | Admitting: Cardiology

## 2019-02-17 NOTE — Telephone Encounter (Signed)
Pt updated and voiced understanding.  

## 2019-02-17 NOTE — Telephone Encounter (Signed)
Smartphone/ consent/ my chart via emailed/ pre reg completed

## 2019-02-21 ENCOUNTER — Other Ambulatory Visit: Payer: Self-pay

## 2019-02-21 ENCOUNTER — Encounter: Payer: Self-pay | Admitting: Cardiology

## 2019-02-21 ENCOUNTER — Telehealth (INDEPENDENT_AMBULATORY_CARE_PROVIDER_SITE_OTHER): Payer: Medicare Other | Admitting: Cardiology

## 2019-02-21 VITALS — BP 118/72 | HR 70 | Ht 69.0 in | Wt 145.0 lb

## 2019-02-21 DIAGNOSIS — Z79899 Other long term (current) drug therapy: Secondary | ICD-10-CM

## 2019-02-21 DIAGNOSIS — Z7189 Other specified counseling: Secondary | ICD-10-CM

## 2019-02-21 DIAGNOSIS — R079 Chest pain, unspecified: Secondary | ICD-10-CM

## 2019-02-21 DIAGNOSIS — I2510 Atherosclerotic heart disease of native coronary artery without angina pectoris: Secondary | ICD-10-CM | POA: Insufficient documentation

## 2019-02-21 NOTE — Progress Notes (Signed)
Virtual Visit via Video Note   This visit type was conducted due to national recommendations for restrictions regarding the COVID-19 Pandemic (e.g. social distancing) in an effort to limit this patient's exposure and mitigate transmission in our community.  Due to her co-morbid illnesses, this patient is at least at moderate risk for complications without adequate follow up.  This format is felt to be most appropriate for this patient at this time.  All issues noted in this document were discussed and addressed.  A limited physical exam was performed with this format.  Please refer to the patient's chart for her consent to telehealth for [facility].   Date:  02/21/2019   ID:  Christina Edwards, DOB 11-01-1945, MRN [id number]  Patient Location: Home Provider Location: Home  PCP:  Carol Ada, MD  Cardiologist:  Buford Dresser, MD  Electrophysiologist:  None   Evaluation Performed:  Follow-Up Visit post hospitalization  Chief Complaint:  Chest pain follow up post hospitalization  History of Present Illness:    Christina Edwards is a 73 y.o. female with PMH breast cancer, GERD, osteopenia who was seen as a new patient during a recent hospitalization for chest pain. Please see my initial consult note from 02/04/19.  The patient does not have symptoms concerning for COVID-19 infection (fever, chills, cough, or new shortness of breath).   She was discharged from the hospital two weeks ago after an episode of chest pain. ECG unremarkable, troponins negative x3. We discussed options for further evaluation, and after shared decision making she elected to go home and have an outpatient coronary CTA.  Results of echo (from hospitalization) and coronary CTA reviewed at length with her today. She was also discharged on aspirin and atorvastatin. She notes that she has felt generally fatigued since starting the atorvastatin about two weeks ago. We spent time today reviewing  statins, their mechanisms, indications, long term data, and potential side effects.   Her chest pain is improved since her hospitalization, and she has been doing less heavy lifting. We reviewed that her CTA did not show obstructive disease, but there is calcium and mild plaque suggestive of nonobstructive CAD. We spent significant time on risk factor modification and education about secondary prevention.   Denies shortness of breath at rest or with normal exertion. No PND, orthopnea, LE edema or unexpected weight gain. No syncope or palpitations.   Past Medical History:  Diagnosis Date  . Breast cancer ([place]) 1987   Left breast intraductal  . Endometriosis   . GERD (gastroesophageal reflux disease)   . 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 Osteopenia 11/2018   T score -1.9 asked 9% / 1.4%  . Personal history of radiation therapy    Past Surgical History:  Procedure Laterality Date  . APPENDECTOMY  1954  . BREAST LUMPECTOMY Left 1987  . BREAST SURGERY  1987   Lumpectomy  . CERVICAL BIOPSY  W/ LOOP ELECTRODE EXCISION    . EYE SURGERY     laser to fix torn retina  . HAND SURGERY     left hand  . OOPHORECTOMY  1989   LSO  . TONSILLECTOMY  1955  . TUBAL LIGATION  1984     Current Meds  Medication Sig  . aspirin 81 MG chewable tablet Chew 1 tablet (81 mg total) by mouth daily.  Marland Kitchen atorvastatin (LIPITOR) 40 MG tablet Take 1 tablet (40 mg  total) by mouth daily at 6 PM.  . Calcium Carbonate-Simethicone (TUMS PLUS PO) Take 2 tablets by mouth daily as needed (heartburn).   Marland Kitchen ibuprofen (ADVIL) 200 MG tablet Take 200 mg by mouth every 8 (eight) hours as needed for mild pain.  Marland Kitchen loratadine (CLARITIN) 10 MG tablet Take 10 mg by mouth daily.  . valACYclovir (VALTREX) 500 MG tablet Take 500 mg by mouth 2 (two) times daily as needed (outbreak for 7 days).      Allergies:   Sulfa antibiotics; Adhesive [tape]; and Neosporin [neomycin-bacitracin  zn-polymyx]   Social History   Tobacco Use  . Smoking status: Former Research scientist (life sciences)  . Smokeless tobacco: Never Used  . Tobacco comment: in college not much  Substance Use Topics  . Alcohol use: No    Alcohol/week: 0.0 standard drinks  . Drug use: No     Family Hx: The patient's family history includes Breast cancer in her paternal grandmother; Cancer in her mother; Heart disease in her father; Hypertension in her mother; Ovarian cancer in her mother.  ROS:   Please see the history of present illness.    Constitutional: Negative for chills, fever, night sweats, unintentional weight loss  HENT: Negative for ear pain and hearing loss.   Eyes: Negative for loss of vision and eye pain.  Respiratory: Negative for cough, sputum, wheezing.   Cardiovascular: See HPI. Gastrointestinal: Negative for abdominal pain, melena, and hematochezia.  Genitourinary: Negative for dysuria and hematuria.  Musculoskeletal: Negative for falls and myalgias.  Skin: Negative for itching and rash.  Neurological: Negative for focal weakness, focal sensory changes and loss of consciousness.  Endo/Heme/Allergies: Does not bruise/bleed easily.  All other systems reviewed and are negative.   Prior CV studies:   The following studies were reviewed today:  CT coronary 02/15/19 Aorta: Normal size. Ascending aorta 3.1 cm. Mild calcification of the descending aorta. No dissection. Aortic Valve: Trileaflet. Mild calcification of the aortic annulus. Coronary Arteries:  Normal coronary origin.  Right dominance.  RCA is a large dominant artery that gives rise to PDA and two PLV branches. There is minimal (<25%) calcified plaque in the proximal RCA.  Left main is a large artery that gives rise to LAD and LCX arteries. There is minimal, non-calcified plaque.  LAD is a large vessel that has mild (25-49%) calcified plaque proximally. This can be overestimated due to blooming artifact. There are two diagonals without  any significant plaque.  LCX is a non-dominant artery that gives rise to one large OM1 branch. There is no plaque.  Other findings: Normal pulmonary vein drainage into the left atrium. Normal let atrial appendage without a thrombus. Normal size of the pulmonary artery.  IMPRESSION: 1. Coronary calcium score of 187. This was 79th percentile for age and sex matched control.  2. Normal coronary origin with right dominance.  3. No evidence of obstructive CAD.  4. Minimal plaque in the LAD and RCA. Mild plaque in the proximal LAD.  5. Recommend aggressive risk factor modification including high potency statin.  Echo from hospitalization also reviewed  Labs/Other Tests and Data Reviewed:    EKG:  An ECG dated 02/05/19 was personally reviewed today and demonstrated:  NSR  Recent Labs: 02/04/2019: ALT 23; BUN 16; Creatinine, Ser 0.78; Hemoglobin 12.6; Platelets 310; Potassium 3.8; Sodium 139   Recent Lipid Panel Lab Results  Component Value Date/Time   CHOL 191 02/05/2019 02:13 AM   TRIG 95 02/05/2019 02:13 AM   HDL 46 02/05/2019 02:13 AM  CHOLHDL 4.2 02/05/2019 02:13 AM   LDLCALC 126 (H) 02/05/2019 02:13 AM    Wt Readings from Last 3 Encounters:  02/21/19 145 lb (65.8 kg)  02/04/19 149 lb (67.6 kg)  09/28/18 150 lb (68 kg)     Objective:    Vital Signs:  BP 118/72   Pulse 70   Ht 5\' 9"  (1.753 m)   Wt 145 lb (65.8 kg)   LMP 09/22/2000   BMI 21.41 kg/m    VITAL SIGNS:  reviewed GEN:  no acute distress EYES:  sclerae anicteric, EOMI - Extraocular Movements Intact RESPIRATORY:  normal respiratory effort, symmetric expansion CARDIOVASCULAR:  No JVD visualized SKIN:  no rash, lesions or ulcers. MUSCULOSKELETAL:  no obvious deformities. NEURO:  alert and oriented x 3, no obvious focal deficit PSYCH:  normal affect   ASSESSMENT & PLAN:    Chest pain: workup and history supports MSK nature. 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 She will try atorvastatin for 2 more weeks. If she does not feel improved, we will stop the atorvastatin for 2 weeks for washout, then trial rosuvastatin. If she does not tolerate rosuvastatin, we will then wash out again and try pravastatin -recheck lipids/LFTs in 3 mos -normal echo -discussed diet and exercise recommendations, below  CV risk counseling and secondary prevention education: -recommend heart healthy/Mediterranean diet, with whole grains, fruits, vegetable, fish, lean meats, nuts, and olive oil. Limit salt. -recommend moderate walking, 3-5 times/week for 30-50 minutes each session. Aim for at least 150 minutes.week. Goal should be pace of 3 miles/hours, or walking 1.5 miles in 30 minutes -recommend avoidance of tobacco products. Avoid excess alcohol. -Additional risk factor control:  -Diabetes: A1c is not available, no history  -Lipids: off meds, Tchol 191, TG 95, HDL 46, LDL 126  -Blood pressure control: at goal, on no meds  -Weight: BMI 21, normal range  COVID-19 Education: The signs and symptoms of COVID-19 were discussed with the patient and how to seek care for testing (follow up with PCP or arrange E-visit). The importance of social distancing was discussed today.  Time:   Today, I have spent 27 minutes with the patient with telehealth technology discussing the above problems.  Total time with review and preparation was 34 minutes.  Patient Instructions  Medication Instructions:  Your Physician recommend you continue on your current medication as directed.    If you need a refill on your cardiac medications before your next appointment, please call your pharmacy.   Lab work: None  Testing/Procedures: None  Follow-Up: At [facility], you and your health needs are our priority.  As part of our continuing mission to provide you with exceptional heart care, we have created designated Provider Care Teams.   These Care Teams include your primary Cardiologist (physician) and Advanced Practice Providers (APPs -  Physician Assistants and Nurse Practitioners) who all work together to provide you with the care you need, when you need it. You will need a follow up appointment in 3 months.  Please call our office 2 months in advance to schedule this appointment.  You may see Buford Dresser, MD or one of the following Advanced Practice Providers on your designated Care Team:   Rosaria Ferries, PA-C . Jory Sims, DNP, ANP      Medication Adjustments/Labs and Tests Ordered: Current medicines are reviewed at length with the patient today.  Concerns regarding medicines are outlined above.   Tests Ordered:  No orders of the defined types were placed in this encounter.   Medication Changes: No orders of the defined types were placed in this encounter.   Disposition:  Follow up 3 mos, repeat lipids/LFTs  Signed, Buford Dresser, MD  02/21/2019 12:08 PM    [place]

## 2019-02-21 NOTE — Patient Instructions (Signed)

## 2019-03-01 ENCOUNTER — Telehealth: Payer: Self-pay | Admitting: Cardiology

## 2019-03-01 NOTE — Telephone Encounter (Signed)
Yes, she can stop the medication now and see how she feels. Thank you.

## 2019-03-01 NOTE — Telephone Encounter (Signed)
Called patient, she wants to know if she can go ahead and stop the medication because she is so tired of having the issues she is having- she is suppose to stop it on Sunday.   Patient also states she remembers saying to wait 2 weeks to see if the issues go away so that MD can decide on a medication to put her on.   I advised patient I would route message to MD to advise.

## 2019-03-01 NOTE — Telephone Encounter (Signed)
Called patient and notified of message from MD.  Patient verbalized understanding, will call back in a few weeks to let us know how she is doing.

## 2019-03-01 NOTE — Telephone Encounter (Signed)
Pt c/o medication issue:  1. Name of Medication:  atorvastatin (LIPITOR) 40 MG tablet  2. How are you currently taking this medication (dosage and times per day)?  1 40mg  tablet daily   3. Are you having a reaction (difficulty breathing--STAT)?  Fatigue, weakness, dull headache, blurry vision, jittery feeling. Poor balance upon standing on occasion, not regular  4. What is your medication issue? Patient wanted to know if she could stop her medication sooner.  Patient had a telemedicine visit with Dr. Harrell Gave on Monday 06/01 and at this time, they discussed a reaction to the Lipitor. The patient was put on a 30 day regimen of Lipitor while in the hospital, and is supposed to be done with the medication on Sunday. After Sunday, the patient would be off the medication for two weeks, then in two weeks another medication would be tried.  She would like to see if the symptoms go away without the medication in her siste,

## 2019-03-17 ENCOUNTER — Telehealth: Payer: Self-pay | Admitting: Cardiology

## 2019-03-17 NOTE — Telephone Encounter (Signed)
New Message     Patient Has stopped taking the Lipitor and now calling back to see what other medication Dr. Harrell Gave has prescribed for her.

## 2019-03-18 NOTE — Telephone Encounter (Signed)
Patient returning call.

## 2019-03-18 NOTE — Telephone Encounter (Signed)
Spoke with pt who report she feels much better since stopping Lipitor but calling to inquire about what medication Dr. Harrell Gave would like her to take in place of it. Will route to MD

## 2019-03-18 NOTE — Telephone Encounter (Signed)
Follow up ° ° °Patient is returning call. Please call. °

## 2019-03-18 NOTE — Telephone Encounter (Signed)
Left message to call back  

## 2019-03-21 MED ORDER — ROSUVASTATIN CALCIUM 5 MG PO TABS: 5.0000 mg | ORAL_TABLET | Freq: Every day | ORAL | 11 refills | Status: DC

## 2019-03-21 NOTE — Addendum Note (Signed)
Addended by: Meryl Crutch on: 03/21/2019 12:14 PM   Modules accepted: Orders

## 2019-03-21 NOTE — Telephone Encounter (Signed)
We will try a low dose of rosuvastatin, 5 mg daily. This is processed differently than the lipitor. If she develops symptoms on this, please have her call us and let us know. I would start with a 30 day supply with refills until we know she will tolerate it. Thanks.

## 2019-03-21 NOTE — Telephone Encounter (Signed)
Pt updated and voiced understanding. New orders placed.  

## 2019-03-23 DIAGNOSIS — R87613 High grade squamous intraepithelial lesion on cytologic smear of cervix (HGSIL): Secondary | ICD-10-CM

## 2019-03-23 HISTORY — DX: High grade squamous intraepithelial lesion on cytologic smear of cervix (HGSIL): R87.613

## 2019-03-30 ENCOUNTER — Other Ambulatory Visit: Payer: Self-pay

## 2019-03-31 ENCOUNTER — Ambulatory Visit (INDEPENDENT_AMBULATORY_CARE_PROVIDER_SITE_OTHER): Payer: Medicare Other | Admitting: Gynecology

## 2019-03-31 ENCOUNTER — Encounter: Payer: Self-pay | Admitting: Gynecology

## 2019-03-31 VITALS — BP 122/76

## 2019-03-31 DIAGNOSIS — R87612 Low grade squamous intraepithelial lesion on cytologic smear of cervix (LGSIL): Secondary | ICD-10-CM | POA: Diagnosis not present

## 2019-03-31 DIAGNOSIS — Z1151 Encounter for screening for human papillomavirus (HPV): Secondary | ICD-10-CM

## 2019-03-31 NOTE — Progress Notes (Signed)
    Christina Edwards 06/25/1946 [id number]        73 y.o.  G1P1001 presents for Pap smear.  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  Consultation with gynecologic oncologist recommended ongoing surveillance and as long as it remains low-grade to follow expectantly.  Last Pap smear 09/2018 showed LGSIL 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  Past medical history,surgical history, problem list, medications, allergies, family history and social history were all reviewed and documented in the EPIC chart.  Directed ROS with pertinent positives and negatives documented in the history of present illness/assessment and plan.  Exam: [name] assistant Vitals:   03/31/19 0803  BP: 122/76   General appearance:  Normal Abdomen soft nontender without masses guarding rebound Pelvic external BUS vagina with atrophic changes.  Cervix with atrophic changes.  Pap smear/HPV.  Uterus grossly normal midline mobile nontender.  Adnexa without masses or tenderness.  Assessment/Plan:  74 y.o. 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  Patient will follow-up for Pap smear results and we will triage based on these results.  We will plan continued expectant management if low-grade   [name] MD, 8:22 AM 03/31/2019

## 2019-03-31 NOTE — Addendum Note (Signed)
Addended by: Nelva Nay on: 03/31/2019 08:27 AM   Modules accepted: Orders

## 2019-03-31 NOTE — Patient Instructions (Signed)
Follow-up in 6 months for your annual exam

## 2019-04-01 ENCOUNTER — Encounter: Payer: Self-pay | Admitting: Gynecology

## 2019-04-01 LAB — PAP IG AND HPV HIGH-RISK: HPV DNA High Risk: DETECTED — AB

## 2019-04-18 ENCOUNTER — Ambulatory Visit: Payer: Medicare Other | Admitting: Gynecology

## 2019-04-22 ENCOUNTER — Other Ambulatory Visit: Payer: Self-pay

## 2019-04-25 ENCOUNTER — Other Ambulatory Visit: Payer: Self-pay

## 2019-04-25 ENCOUNTER — Ambulatory Visit (INDEPENDENT_AMBULATORY_CARE_PROVIDER_SITE_OTHER): Payer: Medicare Other | Admitting: Gynecology

## 2019-04-25 ENCOUNTER — Encounter: Payer: Self-pay | Admitting: Gynecology

## 2019-04-25 VITALS — BP 118/76

## 2019-04-25 DIAGNOSIS — R87613 High grade squamous intraepithelial lesion on cytologic smear of cervix (HGSIL): Secondary | ICD-10-CM | POA: Diagnosis not present

## 2019-04-25 NOTE — Patient Instructions (Signed)
Office will call you with biopsy results 

## 2019-04-28 ENCOUNTER — Telehealth: Payer: Self-pay | Admitting: Gynecology

## 2019-04-28 NOTE — Telephone Encounter (Signed)
I called patient with pathology results from her colposcopy.  The ECC showed mild atypia in the squamous epithelium and benign endocervical tissue.  Her Pap smear showed HGSIL.  I reviewed the possibilities to include missed HGSIL versus no HGSIL but persistent LGSIL.  Options for ongoing management reviewed to include observation with follow-up ECC in 3 months for completeness to resample the canal to make sure were not missing something, proceeding with LEEP now, hysterectomy recognizing the persistence of HPV and lastly evaluation by gynecologic oncology for their input and management as they see fit.  After discussing all this the patient and I agree for GYN oncology appointment to let them look at the total situation for recommendations.  We will go ahead make this arrangement for her.  She knows to call our office if she does not hear within the next 2 weeks.

## 2019-05-02 NOTE — Telephone Encounter (Signed)
Sent staff message to [facility] to let me know time and date.

## 2019-05-03 ENCOUNTER — Telehealth: Payer: Self-pay | Admitting: *Deleted

## 2019-05-03 NOTE — Telephone Encounter (Signed)
Patient scheduled on 8/20 with Dr. Skeet Latch.

## 2019-05-03 NOTE — Telephone Encounter (Signed)
Called and spoke with the patient, schedule her for a new patient appt on 8/20

## 2019-05-12 ENCOUNTER — Other Ambulatory Visit: Payer: Self-pay

## 2019-05-12 ENCOUNTER — Encounter: Payer: Self-pay | Admitting: Gynecologic Oncology

## 2019-05-12 ENCOUNTER — Inpatient Hospital Stay: Payer: Medicare Other | Attending: Gynecologic Oncology | Admitting: Gynecologic Oncology

## 2019-05-12 DIAGNOSIS — Z7982 Long term (current) use of aspirin: Secondary | ICD-10-CM | POA: Insufficient documentation

## 2019-05-12 DIAGNOSIS — Z87891 Personal history of nicotine dependence: Secondary | ICD-10-CM | POA: Insufficient documentation

## 2019-05-12 DIAGNOSIS — Z79899 Other long term (current) drug therapy: Secondary | ICD-10-CM | POA: Insufficient documentation

## 2019-05-12 DIAGNOSIS — M8580 Other specified disorders of bone density and structure, unspecified site: Secondary | ICD-10-CM | POA: Diagnosis not present

## 2019-05-12 DIAGNOSIS — K219 Gastro-esophageal reflux disease without esophagitis: Secondary | ICD-10-CM | POA: Insufficient documentation

## 2019-05-12 DIAGNOSIS — R87613 High grade squamous intraepithelial lesion on cytologic smear of cervix (HGSIL): Secondary | ICD-10-CM

## 2019-05-12 NOTE — Progress Notes (Signed)
Consult Note: Gyn-Onc  Consult was requested by Dr. Phineas Real for the evaluation of Christina Edwards 73 y.o. female  CC:  Chief Complaint  Patient presents with  . High grade pap smear    new patient    Assessment/Plan:  Ms. Christina Edwards is a 73 y.o. with Pap demonstrating high-grade SIL and ECC with mild atypia.  The colposcopic evaluation of the cervix is inadequate as the transformation zone is not entirely visible.  At this visit colposcopy of the vagina and external genitalia were also performed and vaginal biopsies were collected.   Even the high-grade lesion on Pap and the fact that cervical colposcopy is inadequate, There is minimal residual cervix within the vaginal vault and on palpation the cervix itself feels quite narrow perhaps maybe a centimeter although there does appear to be at least 2-3 cm in length.  An option is an attempted cold knife cone which I believe will be very difficult, or a hysterectomy.  I believe the lesion is likely in the endocervical canal.  A repeat ECC was collected today in addition to biopsies of the vagina.Christina Edwards will follow-up in approximately 2 weeks to discuss the results and recommendations.  HPI: Ms. Christina Edwards is a 73 y.o.   gravida 1 para 1 who reports abnormal Pap test since 2017.  Reports history of a prior LEEP.   Her most recent history is notable for a Pap test in January 2020 that showed low-grade SIL with positive high risk HPV.  Most recent Pap test March 31, 2019 returned with high-grade SIL.  Colposcopy was performed and was notable for an ECC that showed mild atypia.  Review of Systems:  Constitutional  Feels well,  Cardiovascular  No chest pain, shortness of breath, or edema  Pulmonary  No cough or wheeze.  Gastro Intestinal  No nausea, vomitting, or diarrhoea. No bright red blood per rectum, no abdominal pain, change in bowel movement, or constipation.  Genito Urinary  No frequency,  urgency, dysuria, Musculo Skeletal  No myalgia, arthralgia, joint swelling or pain  Neurologic  No weakness, numbness, change in gait,  Psychology  No depression, anxiety, insomnia.    Current Meds:  Outpatient Encounter Medications as of 05/12/2019  Medication Sig  . aspirin 81 MG chewable tablet Chew 1 tablet (81 mg total) by mouth daily.  Marland Kitchen ibuprofen (ADVIL) 200 MG tablet Take 200 mg by mouth every 8 (eight) hours as needed for mild pain.  . rosuvastatin (CRESTOR) 5 MG tablet Take 1 tablet (5 mg total) by mouth daily at 6 PM.  . valACYclovir (VALTREX) 500 MG tablet Take 500 mg by mouth 2 (two) times daily as needed (outbreak for 7 days).   . [DISCONTINUED] Calcium Carbonate-Simethicone (TUMS PLUS PO) Take 2 tablets by mouth daily as needed (heartburn).    No facility-administered encounter medications on file as of 05/12/2019.     Allergy:  Allergies  Allergen Reactions  . Sulfa Antibiotics Other (See Comments)    Reaction unknown  . Adhesive [Tape] Rash  . Neosporin [Neomycin-Bacitracin Zn-Polymyx] Rash    Social Hx:   Social History   Socioeconomic History  . Marital status: Married    Spouse name: Not on file  . Number of children: Not on file  . Years of education: Not on file  . Highest education level: Not on file  Occupational History  . Not on file  Social Needs  . Financial resource strain: Not  on file  . Food insecurity    Worry: Not on file    Inability: Not on file  . Transportation needs    Medical: Not on file    Non-medical: Not on file  Tobacco Use  . Smoking status: Former Research scientist (life sciences)  . Smokeless tobacco: Never Used  . Tobacco comment: in college not much  Substance and Sexual Activity  . Alcohol use: No    Alcohol/week: 0.0 standard drinks  . Drug use: No  . Sexual activity: Yes    Birth control/protection: Surgical, Post-menopausal    Comment: 1st intercourse 73 yo-Fewer than 5 partners  Lifestyle  . Physical activity    Days per week: Not  on file    Minutes per session: Not on file  . Stress: Not on file  Relationships  . Social Herbalist on phone: Not on file    Gets together: Not on file    Attends religious service: Not on file    Active member of club or organization: Not on file    Attends meetings of clubs or organizations: Not on file    Relationship status: Not on file  . Intimate partner violence    Fear of current or ex partner: Not on file    Emotionally abused: Not on file    Physically abused: Not on file    Forced sexual activity: Not on file  Other Topics Concern  . Not on file  Social History Narrative  . Not on file    Past Surgical Hx:  Past Surgical History:  Procedure Laterality Date  . APPENDECTOMY  1954  . BREAST LUMPECTOMY Left 1987  . BREAST SURGERY  1987   Lumpectomy  . CERVICAL BIOPSY  W/ LOOP ELECTRODE EXCISION    . EYE SURGERY     laser to fix torn retina  . HAND SURGERY     left hand  . OOPHORECTOMY  1989   LSO  . TONSILLECTOMY  1955  . TUBAL LIGATION  1984    Past Medical Hx:  Past Medical History:  Diagnosis Date  . Breast cancer ([place]) 1987   Left breast intraductal  . Diverticulitis   . Endometriosis   . GERD (gastroesophageal reflux disease)   . 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 Osteopenia 11/2018   T score -1.9 asked 9% / 1.4%  . Personal history of radiation therapy     Past Gynecological History: Reports abnormal Pap since 2017 gravida 1 para 1  Family Hx:  Family History  Problem Relation Age of Onset  . Cancer Mother        Colon cancer  . Hypertension Mother   . Ovarian cancer Mother   . Heart disease Father   . Breast cancer Paternal Grandmother        Age 11's    Vitals:  Blood pressure (!) 143/71, pulse 63, temperature 98.3 F (36.8 C), resp. rate 18, height 5\' 9"  (1.753 m), weight 146 lb 9.6 oz  (66.5 kg), last menstrual period 09/22/2000, SpO2 100 %.  Physical Exam: WD in NAD Neck  Supple NROM, without any enlargements.  Lymph Node Survey No cervical supraclavicular or inguinal adenopathy Cardiovascular  Pulse normal rate, regularity and rhythm. Lungs  Clear to auscultation bilaterally,   Skin  No rash/lesions/breakdown  Psychiatry  Alert and  oriented appropriate mood affect speech and reasoning. Abdomen  Normoactive bowel sounds, abdomen soft, non-tender.  Back No CVA tenderness Genito Urinary  Vulva/vagina: Normal external female genitalia.  Colposcopic evaluation no lesions were appreciated extending from the mons to the perianal area.  Bladder/urethra:  No lesions or masses  Vagina: Atrophic  cervix: Very small opening, squamocolumnar junction is not entirely visible ECC only feasible with the use of a brush, on colposcopy of the cervix and vagina white epithelium was noted in the right proximal vagina.  Biopsies were collected,   Uterus: Mobile, no parametrial involvement or nodularity.  Adnexa: No palpable masses. Rectal  Good tone, no masses no cul de sac nodularity.  Extremities  No bilateral cyanosis, clubbing or edema.

## 2019-05-24 ENCOUNTER — Encounter: Payer: Self-pay | Admitting: Cardiology

## 2019-05-24 ENCOUNTER — Ambulatory Visit (INDEPENDENT_AMBULATORY_CARE_PROVIDER_SITE_OTHER): Payer: Medicare Other | Admitting: Cardiology

## 2019-05-24 ENCOUNTER — Other Ambulatory Visit: Payer: Self-pay

## 2019-05-24 VITALS — BP 122/68 | HR 63 | Ht 69.0 in | Wt 147.8 lb

## 2019-05-24 DIAGNOSIS — Z79899 Other long term (current) drug therapy: Secondary | ICD-10-CM

## 2019-05-24 DIAGNOSIS — I2510 Atherosclerotic heart disease of native coronary artery without angina pectoris: Secondary | ICD-10-CM

## 2019-05-24 DIAGNOSIS — Z7189 Other specified counseling: Secondary | ICD-10-CM

## 2019-05-24 DIAGNOSIS — E7800 Pure hypercholesterolemia, unspecified: Secondary | ICD-10-CM | POA: Diagnosis not present

## 2019-05-24 LAB — HEPATIC FUNCTION PANEL
ALT: 13 IU/L (ref 0–32)
AST: 23 IU/L (ref 0–40)
Albumin: 4.7 g/dL (ref 3.7–4.7)
Alkaline Phosphatase: 108 IU/L (ref 39–117)
Bilirubin Total: 0.5 mg/dL (ref 0.0–1.2)
Bilirubin, Direct: 0.14 mg/dL (ref 0.00–0.40)
Total Protein: 7.2 g/dL (ref 6.0–8.5)

## 2019-05-24 LAB — LIPID PANEL
Chol/HDL Ratio: 2.4 ratio (ref 0.0–4.4)
Cholesterol, Total: 129 mg/dL (ref 100–199)
HDL: 54 mg/dL (ref >39)
LDL Chol Calc (NIH): 55 mg/dL (ref 0–99)
Triglycerides: 111 mg/dL (ref 0–149)
VLDL Cholesterol Cal: 20 mg/dL (ref 5–40)

## 2019-05-24 NOTE — Progress Notes (Signed)
Cardiology Office Note:    Date:  05/24/2019   ID:  Christina Edwards, DOB 1946/04/04, MRN [id number]  PCP:  Carol Ada, MD  Cardiologist:  Buford Dresser, MD PhD  Referring MD: Carol Ada, MD   CC: follow up  History of Present Illness:    Christina Edwards is a 73 y.o. female with a hx of PMH breast cancer, GERD, osteopenia who was seen as a new patient during a recent hospitalization for chest pain. Please see my initial consult note from 02/04/19.  Cardiac history: Admitted 01/2019 with chest pain. ECG unremarkable, troponins negative x3. Elected for outpatient CTA. CTA with calcium score 187 (79th %), no obstructive CAD, minimal-mild plaque in LAD and RCA. Echo normal.  Today:  Started rosuvastatin 5 mg after she had fatigue, weakness, headache, blurry vision, jittery feeling, poor balance on atorvastatin 40 mg. She felt better after letting the atorvastatin wear off.   Doing very well today. Tolerating rosuvastatin 5 mg daily and aspirin 81 mg. No issues. No bruising/bleeding issues. No more chest pain. Counseled on interaction of aspirin and NSAIDs on GI and kidneys. Takes ibuprofen only rarely. Had recent GI illness but finally recovering. She is fasting currently for lipids. Has been working on diet since May.   Past Medical History:  Diagnosis Date  . Breast cancer ([place]) 1987   Left breast intraductal  . Diverticulitis   . Endometriosis   . GERD (gastroesophageal reflux disease)   . 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 Osteopenia 11/2018   T score -1.9 asked 9% / 1.4%  . Personal history of radiation therapy     Past Surgical History:  Procedure Laterality Date  . APPENDECTOMY  1954  . BREAST LUMPECTOMY Left 1987  . BREAST SURGERY  1987   Lumpectomy  . CERVICAL BIOPSY  W/ LOOP ELECTRODE EXCISION    . EYE  SURGERY     laser to fix torn retina  . HAND SURGERY     left hand  . OOPHORECTOMY  1989   LSO  . TONSILLECTOMY  1955  . TUBAL LIGATION  1984    Current Medications: Current Outpatient Medications on File Prior to Visit  Medication Sig  . aspirin 81 MG chewable tablet Chew 1 tablet (81 mg total) by mouth daily.  Marland Kitchen ibuprofen (ADVIL) 200 MG tablet Take 200 mg by mouth every 8 (eight) hours as needed for mild pain.  . rosuvastatin (CRESTOR) 5 MG tablet Take 1 tablet (5 mg total) by mouth daily at 6 PM.  . valACYclovir (VALTREX) 500 MG tablet Take 500 mg by mouth 2 (two) times daily as needed (outbreak for 7 days).    No current facility-administered medications on file prior to visit.      Allergies:   Sulfa antibiotics, Adhesive [tape], and Neosporin [neomycin-bacitracin zn-polymyx]   Social History   Socioeconomic History  . Marital status: Married    Spouse name: Not on file  . Number of children: Not on file  . Years of education: Not on file  . Highest education level: Not on file  Occupational History  . Not on file  Social Needs  . Financial resource strain: Not on file  . Food insecurity    Worry: Not on file    Inability: Not on file  . Transportation needs  Medical: Not on file    Non-medical: Not on file  Tobacco Use  . Smoking status: Former Research scientist (life sciences)  . Smokeless tobacco: Never Used  . Tobacco comment: in college not much  Substance and Sexual Activity  . Alcohol use: No    Alcohol/week: 0.0 standard drinks  . Drug use: No  . Sexual activity: Yes    Birth control/protection: Surgical, Post-menopausal    Comment: 1st intercourse 73 yo-Fewer than 5 partners  Lifestyle  . Physical activity    Days per week: Not on file    Minutes per session: Not on file  . Stress: Not on file  Relationships  . Social Herbalist on phone: Not on file    Gets together: Not on file    Attends religious service: Not on file    Active member of club or  organization: Not on file    Attends meetings of clubs or organizations: Not on file    Relationship status: Not on file  Other Topics Concern  . Not on file  Social History Narrative  . Not on file     Family History: The patient's family history includes Breast cancer in her paternal grandmother; Cancer in her mother; Heart disease in her father; Hypertension in her mother; Ovarian cancer in her mother.  ROS:   Please see the history of present illness.  Additional pertinent ROS: Constitutional: Negative for chills, fever, night sweats, unintentional weight loss  HENT: Negative for ear pain and hearing loss.   Eyes: Negative for loss of vision and eye pain.  Respiratory: Negative for cough, sputum, wheezing.   Cardiovascular: See HPI. Gastrointestinal: Negative for abdominal pain, melena, and hematochezia.  Genitourinary: Negative for dysuria and hematuria.  Musculoskeletal: Negative for falls and myalgias.  Skin: Negative for itching and rash.  Neurological: Negative for focal weakness, focal sensory changes and loss of consciousness.  Endo/Heme/Allergies: Does not bruise/bleed easily.     EKGs/Labs/Other Studies Reviewed:    The following studies were reviewed today: CT coronary 02/15/19 Aorta: Normal size. Ascending aorta 3.1 cm. Mild calcification of the descending aorta. No dissection. Aortic Valve: Trileaflet. Mild calcification of the aortic annulus. Coronary Arteries: Normal coronary origin. Right dominance.  RCA is a large dominant artery that gives rise to PDA and two PLV branches. There is minimal (<25%) calcified plaque in the proximal RCA.  Left main is a large artery that gives rise to LAD and LCX arteries. There is minimal, non-calcified plaque.  LAD is a large vessel that has mild (25-49%) calcified plaque proximally. This can be overestimated due to blooming artifact. There are two diagonals without any significant plaque.  LCX is a  non-dominant artery that gives rise to one large OM1 branch. There is no plaque.  Other findings: Normal pulmonary vein drainage into the left atrium. Normal let atrial appendage without a thrombus. Normal size of the pulmonary artery.  IMPRESSION: 1. Coronary calcium score of 187. This was 79th percentile for age and sex matched control.  2. Normal coronary origin with right dominance.  3. No evidence of obstructive CAD.  4. Minimal plaque in the LAD and RCA. Mild plaque in the proximal LAD.  5. Recommend aggressive risk factor modification including high potency statin.  Echo 02/05/19  1. The left ventricle has normal systolic function with an ejection fraction of 60-65%. The cavity size was normal. Left ventricular diastolic parameters were normal. No evidence of left ventricular regional wall motion abnormalities.  2. The  right ventricle has normal systolic function. The cavity was normal. There is no increase in right ventricular wall thickness.  3. The TV is normal with mild TR.  EKG:  EKG is personally reviewed.  The ekg ordered today demonstrates NSR, iRBBB  Recent Labs: 02/04/2019: ALT 23; BUN 16; Creatinine, Ser 0.78; Hemoglobin 12.6; Platelets 310; Potassium 3.8; Sodium 139  Recent Lipid Panel    Component Value Date/Time   CHOL 191 02/05/2019 0213   TRIG 95 02/05/2019 0213   HDL 46 02/05/2019 0213   CHOLHDL 4.2 02/05/2019 0213   VLDL 19 02/05/2019 0213   LDLCALC 126 (H) 02/05/2019 0213    Physical Exam:    VS:  BP 122/68   Pulse 63   Ht 5\' 9"  (1.753 m)   Wt 147 lb 12.8 oz (67 kg)   LMP 09/22/2000   SpO2 98%   BMI 21.83 kg/m     Wt Readings from Last 3 Encounters:  05/24/19 147 lb 12.8 oz (67 kg)  05/12/19 146 lb 9.6 oz (66.5 kg)  02/21/19 145 lb (65.8 kg)     GEN: Well nourished, well developed in no acute distress HEENT: Normal, moist mucous membranes NECK: No JVD CARDIAC: regular rhythm, normal S1 and S2, no murmurs, rubs, gallops.   VASCULAR: Radial and DP pulses 2+ bilaterally. No carotid bruits RESPIRATORY:  Clear to auscultation without rales, wheezing or rhonchi  ABDOMEN: Soft, non-tender, non-distended MUSCULOSKELETAL:  Ambulates independently SKIN: Warm and dry, no edema NEUROLOGIC:  Alert and oriented x 3. No focal neuro deficits noted. PSYCHIATRIC:  Normal affect    ASSESSMENT:    1. Coronary artery disease involving native coronary artery of native heart without angina pectoris   2. Medication management   3. Pure hypercholesterolemia   4. Cardiac risk counseling   5. Counseling on health promotion and disease prevention    PLAN:    Nonobstructive CAD on coronary CTA: Coronary CTA without obstructive CAD but does show evidence of nonobstructive CAD -tolerating low dose rosuvastatin. We discussed that ideally we would like LDL <70 but any improvement is movement in the right direction -no further chest pain -recheck lipids/LFTs today, she is fasting -normal echo -discussed diet and exercise recommendations, below -tolerating aspirin 81 mg daily.  Hyperlipidemia:  -off meds, Tchol 191, TG 95, HDL 46, LDL 126 -symptoms on atorvastatin 40 mg, improved with stopping -trialing 5 mg rosuvastatin. Ideally would like higher dose but balance with tolerating statin. Will discuss based on how close she is to goal of LDL <70.  Cardiac risk counseling and prevention recommendations: made excellent changes since hospitalization -recommend heart healthy/Mediterranean diet, with whole grains, fruits, vegetable, fish, lean meats, nuts, and olive oil. Limit salt. -recommend moderate walking, 3-5 times/week for 30-50 minutes each session. Aim for at least 150 minutes.week. Goal should be pace of 3 miles/hours, or walking 1.5 miles in 30 minutes -recommend avoidance of tobacco products. Avoid excess alcohol. -Additional risk factor control:             -Diabetes: A1c is not available, no history             -Lipids: as  above             -Blood pressure control: at goal, on no meds             -Weight: BMI 21, normal range -ASCVD risk score: The 10-year ASCVD risk score Mikey Bussing DC Jr., et al., 2013) is: 9.8%   Values used to  calculate the score:     Age: 62 years     Sex: Female     Is Non-Hispanic African American: No     Diabetic: No     Tobacco smoker: No     Systolic Blood Pressure: 123XX123 mmHg     Is BP treated: No     HDL Cholesterol: 46 mg/dL     Total Cholesterol: 191 mg/dL    Plan for follow up: 1 year or sooner PRN  Medication Adjustments/Labs and Tests Ordered: Current medicines are reviewed at length with the patient today.  Concerns regarding medicines are outlined above.  Orders Placed This Encounter  Procedures  . Lipid panel  . Hepatic function panel  . EKG 12-Lead   No orders of the defined types were placed in this encounter.   Patient Instructions  Medication Instructions:  Your Physician recommend you continue on your current medication as directed.    If you need a refill on your cardiac medications before your next appointment, please call your pharmacy.   Lab work: Your physician recommends that you return for lab work today (Lipid, LFT)  If you have labs (blood work) drawn today and your tests are completely normal, you will receive your results only by: Marland Kitchen MyChart Message (if you have MyChart) OR . A paper copy in the mail If you have any lab test that is abnormal or we need to change your treatment, we will call you to review the results.  Testing/Procedures: None  Follow-Up: At [facility] you and your health needs are our priority.  As part of our continuing mission to provide you with exceptional heart care, we have created designated Provider Care Teams.  These Care Teams include your primary Cardiologist (physician) and Advanced Practice Providers (APPs -  Physician Assistants and Nurse Practitioners) who all work together to provide you with the care you  need, when you need it. You will need a follow up appointment in 1 years.  Please call our office 2 months in advance to schedule this appointment.  You may see Buford Dresser, MD or one of the following Advanced Practice Providers on your designated Care Team:   Rosaria Ferries, PA-C . Jory Sims, DNP, ANP        Signed, Buford Dresser, MD PhD 05/24/2019 8:40 AM    [place]

## 2019-05-24 NOTE — Patient Instructions (Signed)
Medication Instructions:  Your Physician recommend you continue on your current medication as directed.    If you need a refill on your cardiac medications before your next appointment, please call your pharmacy.   Lab work: Your physician recommends that you return for lab work today (Lipid, LFT)  If you have labs (blood work) drawn today and your tests are completely normal, you will receive your results only by: Marland Kitchen MyChart Message (if you have MyChart) OR . A paper copy in the mail If you have any lab test that is abnormal or we need to change your treatment, we will call you to review the results.  Testing/Procedures: None  Follow-Up: At [facility], you and your health needs are our priority.  As part of our continuing mission to provide you with exceptional heart care, we have created designated Provider Care Teams.  These Care Teams include your primary Cardiologist (physician) and Advanced Practice Providers (APPs -  Physician Assistants and Nurse Practitioners) who all work together to provide you with the care you need, when you need it. You will need a follow up appointment in 1 years.  Please call our office 2 months in advance to schedule this appointment.  You may see Buford Dresser, MD or one of the following Advanced Practice Providers on your designated Care Team:   Rosaria Ferries, PA-C . Jory Sims, DNP, ANP

## 2019-05-25 NOTE — Progress Notes (Signed)
GYN ONCOLOGY OFFICE VISIT   Referring Physician Dr. Maxine Glenn 73 y.o. female  CC:  Christina Edwards  Assessment/Plan:  Ms. Christina Edwards is a 73 y.o. with Pap demonstrating high-grade SIL and ECC with mild atypia.  The 04/2019 colposcopic evaluation of the cervix is inadequate as the transformation zone is not entirely visible. 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 Vulvar colposcopy is negative.    ASCCP 2020  guidelines state that CIN-1 diagnosed with consecutive visits for at least 2 years observation is preferred but treatment is acceptable.  However this patient's presentation is complicated by the fact that colposcopy is inadequate, the lesion is within the endocervical canal, the cervix is flush with the vaginal vault, she is hrHPV + and there is a relatively recent HGSIL pap  I recommend total  Hysterectomy.  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  With respect to the concept of continued observation with Pap tesing.  Her Pap tests have been  low-grade SIL for over 3 years.  Colposcopy will remain in inadequate and there is the possibility that there is no other worse dysplasia within the endocervical canal.  I am reluctant to  give that assurance given the cervical stenosis.     I discussed my recommendations with Dr. Phineas Real. F/U with Gyn Onc PRN.  20 minutes were spent with Christina Edwards in the medical decision making.    HPI: Ms. Christina Edwards is a 73 y.o.   gravida 1 para 1 who reports abnormal Pap test since 2017.  Reports history of a prior LEEP.   Her most recent history is notable for a Pap test in January 2020 that showed low-grade SIL with positive high risk HPV.  Most recent Pap test March 31, 2019 returned with high-grade SIL.  Colposcopy was performed and was notable for an ECC that showed mild atypia.  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  Genito Urinary  No frequency, urgency, dysuria,vaginal spoting   Current Meds:  Outpatient Encounter Medications as of 05/26/2019  Medication Sig  . aspirin 81 MG chewable tablet Chew 1 tablet (81 mg total) by mouth daily.  Marland Kitchen ibuprofen (ADVIL) 200 MG tablet Take 200 mg by mouth every 8 (eight) hours as needed for mild pain.  . rosuvastatin (CRESTOR) 5 MG tablet Take 1 tablet (5 mg total) by mouth daily at 6 PM.  . valACYclovir (VALTREX) 500 MG tablet Take 500 mg by mouth 2 (two) times daily as needed (outbreak for 7 days).    No facility-administered encounter medications on file as of 05/26/2019.     Allergy:  Allergies  Allergen Reactions  . Sulfa Antibiotics Other (See Comments)    Reaction unknown  . Adhesive [Tape] Rash  . Neosporin [Neomycin-Bacitracin Zn-Polymyx] Rash    Social Hx:   Social History   Socioeconomic History  . Marital status: Married    Spouse name: Not on file  . Number of children: Not on file  . Years of education: Not on file  . Highest education level: Not on file  Occupational History  . Not on file  Social Needs  . Financial resource strain: Not on file  . Food insecurity    Worry: Not on file    Inability: Not on file  . Transportation needs  Medical: Not on file    Non-medical: Not on file  Tobacco Use  . Smoking status: Former Research scientist (life sciences)  . Smokeless tobacco: Never Used  . Tobacco comment: in college not much  Substance and Sexual Activity  . Alcohol use: No    Alcohol/week: 0.0 standard drinks  . Drug use: No  . Sexual activity: Yes    Birth control/protection: Surgical, Post-menopausal    Comment: 1st intercourse 73 yo-Fewer than 5 partners  Lifestyle  . Physical activity    Days per week: Not on file    Minutes per session: Not on file  . Stress: Not on file  Relationships  . Social Herbalist on phone: Not on file    Gets together: Not on file    Attends religious  service: Not on file    Active member of club or organization: Not on file    Attends meetings of clubs or organizations: Not on file    Relationship status: Not on file  . Intimate partner violence    Fear of current or ex partner: Not on file    Emotionally abused: Not on file    Physically abused: Not on file    Forced sexual activity: Not on file  Other Topics Concern  . Not on file  Social History Narrative  . Not on file    Past Surgical Hx:  Past Surgical History:  Procedure Laterality Date  . APPENDECTOMY  1954  . BREAST LUMPECTOMY Left 1987  . BREAST SURGERY  1987   Lumpectomy  . CERVICAL BIOPSY  W/ LOOP ELECTRODE EXCISION    . EYE SURGERY     laser to fix torn retina  . HAND SURGERY     left hand  . OOPHORECTOMY  1989   LSO  . TONSILLECTOMY  1955  . TUBAL LIGATION  1984    Past Medical Hx:  Past Medical History:  Diagnosis Date  . Breast cancer ([place]) 1987   Left breast intraductal  . Diverticulitis   . Endometriosis   . GERD (gastroesophageal reflux disease)   . 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 Osteopenia 11/2018   T score -1.9 asked 9% / 1.4%  . Personal history of radiation therapy     Past Gynecological History: Reports abnormal Pap since 2017 gravida 1 para 1  Family Hx:  Family History  Problem Relation Age of Onset  . Cancer Mother        Colon cancer  . Hypertension Mother   . Ovarian cancer Mother   . Heart disease Father   . Breast cancer Paternal Grandmother        Age 35's    Vitals:  Blood pressure (!) 143/58, pulse 63, temperature 98.7 F (37.1 C), temperature source Oral, resp. rate 18, height 5\' 9"  (1.753 m), weight 148 lb (67.1 kg), last menstrual period 09/22/2000, SpO2 99 %.  Physical Exam: WD in NAD Neck  Supple NROM, without any enlargements.

## 2019-05-26 ENCOUNTER — Other Ambulatory Visit: Payer: Self-pay

## 2019-05-26 ENCOUNTER — Inpatient Hospital Stay: Payer: Medicare Other | Attending: Gynecologic Oncology | Admitting: Gynecologic Oncology

## 2019-05-26 ENCOUNTER — Telehealth: Payer: Self-pay

## 2019-05-26 DIAGNOSIS — Z853 Personal history of malignant neoplasm of breast: Secondary | ICD-10-CM | POA: Insufficient documentation

## 2019-05-26 DIAGNOSIS — Z923 Personal history of irradiation: Secondary | ICD-10-CM | POA: Insufficient documentation

## 2019-05-26 DIAGNOSIS — K219 Gastro-esophageal reflux disease without esophagitis: Secondary | ICD-10-CM | POA: Diagnosis not present

## 2019-05-26 DIAGNOSIS — N870 Mild cervical dysplasia: Secondary | ICD-10-CM | POA: Insufficient documentation

## 2019-05-26 DIAGNOSIS — A630 Anogenital (venereal) warts: Secondary | ICD-10-CM | POA: Insufficient documentation

## 2019-05-26 DIAGNOSIS — N890 Mild vaginal dysplasia: Secondary | ICD-10-CM | POA: Insufficient documentation

## 2019-05-26 DIAGNOSIS — M8580 Other specified disorders of bone density and structure, unspecified site: Secondary | ICD-10-CM | POA: Insufficient documentation

## 2019-05-26 NOTE — Patient Instructions (Signed)
Follow-up as recommended by Dr. Phineas Real  Thank you very much Ms. Christina Edwards for allowing me to provide care for you today.  I appreciate your confidence in choosing our Gynecologic Oncology team.  If you have any questions about your visit today please call our office and we will get back to you as soon as possible.  Please consider using the website Medlineplus.gov as an Geneticist, molecular.   Christina Edwards. Aasiya Creasey MD., PhD Gynecologic Oncology

## 2019-05-26 NOTE — Telephone Encounter (Signed)
I would recommend that they go ahead and do the procedure.

## 2019-05-26 NOTE — Telephone Encounter (Signed)
Patient said she is going to see Dr. Skeet Latch at the [facility] today. She suspects she is going to recommend hysterectomy. She questioned if you would do hysterectomy or how would that work.

## 2019-05-26 NOTE — Telephone Encounter (Signed)
Patient advised. Patient said that Dr. Skeet Latch recommended hysterectomy but told her no rush she could even wait until next year but should have Pap smear in 3 mos. Appt scheduled for that with Dr. Loetta Rough.

## 2019-05-31 ENCOUNTER — Telehealth: Payer: Self-pay | Admitting: Gynecology

## 2019-05-31 NOTE — Telephone Encounter (Signed)
Tell patient that I discussed her case with Dr. Skeet Latch from GYN oncology.  Dr. Skeet Latch had recommended that the patient proceed with hysterectomy I think that would be a good idea as I think we will be chasing abnormal Pap smears for years to come if we do not.  No guarantee that she would not develope atypia in the vagina but the cervical atypia would be gone.  My recommendation would be to follow-up with Dr. Skeet Latch for the hysterectomy.  It is not an emergency but should be addressed this coming year.  I would be more than happy to answer any questions she may have.

## 2019-06-01 NOTE — Telephone Encounter (Signed)
I think if she is ultimately going to proceed with hysterectomy then repeat Pap smear is not necessary because it would not change outcome.

## 2019-06-01 NOTE — Telephone Encounter (Signed)
Patient informed below note, she is scheduled for repeat pap smear on 08/30/19 here, reports at office visit with Dr. Skeet Latch she told patient that she recommends to follow up with you for repeat pap smear. Keep scheduled repeat pap?

## 2019-06-01 NOTE — Telephone Encounter (Signed)
Patient informed with below note, pap smear is canceled

## 2019-06-29 ENCOUNTER — Encounter: Payer: Self-pay | Admitting: Gynecology

## 2019-08-30 ENCOUNTER — Ambulatory Visit: Payer: Medicare Other | Admitting: Gynecology

## 2019-09-01 ENCOUNTER — Other Ambulatory Visit: Payer: Self-pay | Admitting: Family Medicine

## 2019-09-01 DIAGNOSIS — Z1231 Encounter for screening mammogram for malignant neoplasm of breast: Secondary | ICD-10-CM

## 2019-10-03 ENCOUNTER — Telehealth: Payer: Self-pay | Admitting: Cardiology

## 2019-10-03 MED ORDER — ROSUVASTATIN CALCIUM 5 MG PO TABS: 5.0000 mg | ORAL_TABLET | Freq: Every day | ORAL | 2 refills | Status: DC

## 2019-10-03 NOTE — Telephone Encounter (Signed)
New Message  Pt c/o medication issue:  1. Name of Medication: rosuvastatin (CRESTOR) 5 MG tablet(Expired)  2. How are you currently taking this medication (dosage and times per day)? As written  3. Are you having a reaction (difficulty breathing--STAT)? No  4. What is your medication issue? Switch medication from [facility] to [facility] due to it being cheaper and can get a 90 day supply. [facility] fax # is [phone]  Patient wants a phone call back.

## 2019-10-04 ENCOUNTER — Encounter: Payer: Medicare Other | Admitting: Gynecology

## 2019-10-17 ENCOUNTER — Other Ambulatory Visit: Payer: Self-pay

## 2019-10-18 ENCOUNTER — Ambulatory Visit (INDEPENDENT_AMBULATORY_CARE_PROVIDER_SITE_OTHER): Payer: Medicare Other | Admitting: Obstetrics and Gynecology

## 2019-10-18 ENCOUNTER — Encounter: Payer: Self-pay | Admitting: Obstetrics and Gynecology

## 2019-10-18 ENCOUNTER — Other Ambulatory Visit: Payer: Self-pay

## 2019-10-18 VITALS — BP 124/76 | Ht 70.0 in | Wt 146.0 lb

## 2019-10-18 DIAGNOSIS — Z8719 Personal history of other diseases of the digestive system: Secondary | ICD-10-CM

## 2019-10-18 DIAGNOSIS — R87810 Cervical high risk human papillomavirus (HPV) DNA test positive: Secondary | ICD-10-CM | POA: Diagnosis not present

## 2019-10-18 DIAGNOSIS — Z01411 Encounter for gynecological examination (general) (routine) with abnormal findings: Secondary | ICD-10-CM

## 2019-10-18 DIAGNOSIS — N841 Polyp of cervix uteri: Secondary | ICD-10-CM

## 2019-10-18 DIAGNOSIS — N891 Moderate vaginal dysplasia: Secondary | ICD-10-CM | POA: Diagnosis not present

## 2019-10-18 DIAGNOSIS — N870 Mild cervical dysplasia: Secondary | ICD-10-CM | POA: Diagnosis not present

## 2019-10-18 DIAGNOSIS — Z1151 Encounter for screening for human papillomavirus (HPV): Secondary | ICD-10-CM

## 2019-10-18 DIAGNOSIS — Z8742 Personal history of other diseases of the female genital tract: Secondary | ICD-10-CM | POA: Diagnosis not present

## 2019-10-18 DIAGNOSIS — R1031 Right lower quadrant pain: Secondary | ICD-10-CM

## 2019-10-18 NOTE — Patient Instructions (Signed)
It was nice to meet you today! Your pelvic exam was normal today, so I do recommend that you follow-up with your GI specialist regarding recommendations to address abdominal pain that may be related to your history of diverticulosis.  If they do decide to do any abdominal imaging, please have them send our office a copy of the imaging report.  If they do not feel imaging is necessary and you are finding that the right sided abdominal and pelvic pain is continuing, please let our office know and we may want to consider having a pelvic ultrasound scheduled for you to evaluate your ovaries. We will follow up on the results of the Pap smear and the cervical polyp pathology report and we will let you know.  If there are any abnormalities then we will want to consider further discussing and considering moving forward with hysterectomy.  Please let us know if you have any questions.  I am happy to see you back in the office anytime to further discuss these things with you.

## 2019-10-18 NOTE — Progress Notes (Signed)
Christina Edwards 03-01-1946 [id number]  SUBJECTIVE:  74 y.o. G1P1001 female for annual routine gynecologic exam and Pap smear.  She is followed for a persistent LGSIL Pap smear with positive high risk HPV.  Slight post coital spotting noted after last time having intercourse about a month ago.  She also notes a newer RLQ pain intermittent for the past month or so.  After thinking about it she does recall that she has a history of diverticulosis, and this pain does migrate around the abdomen and she thinks it may be related to that.  She does admit she is not consistent with following dietary restrictions for this condition.  She is having regular bowel movements.    Current Outpatient Medications  Medication Sig Dispense Refill  . aspirin 81 MG chewable tablet Chew 1 tablet (81 mg total) by mouth daily.    . calcium carbonate (TUMS - DOSED IN MG ELEMENTAL CALCIUM) 500 MG chewable tablet Chew 1 tablet by mouth daily.    Marland Kitchen ibuprofen (ADVIL) 200 MG tablet Take 200 mg by mouth every 8 (eight) hours as needed for mild pain.    Marland Kitchen loratadine (CLARITIN) 10 MG tablet Take 10 mg by mouth daily.    . rosuvastatin (CRESTOR) 5 MG tablet Take 1 tablet (5 mg total) by mouth daily at 6 PM. 90 tablet 2  . valACYclovir (VALTREX) 500 MG tablet Take 500 mg by mouth 2 (two) times daily as needed (outbreak for 7 days).      No current facility-administered medications for this visit.   Allergies: Sulfa antibiotics, Adhesive [tape], and Neosporin [neomycin-bacitracin zn-polymyx]  Patient's last menstrual period was 09/22/2000.  Past medical history,surgical history, problem list, medications, allergies, family history and social history were all reviewed and documented as reviewed in the EPIC chart.  ROS:  Feeling well. No dyspnea or chest pain on exertion.  No abdominal pain, change in bowel habits, black or bloody stools.  No urinary tract symptoms. GYN ROS: +symptoms (as described in HPI), no breast pain  or new or enlarging lumps on self exam. No neurological complaints.   OBJECTIVE:  Ht 5\' 10"  (1.778 m)   Wt 146 lb (66.2 kg)   LMP 09/22/2000   BMI 20.95 kg/m  The patient appears well, alert, oriented x 3, in no distress. ENT normal.  Neck supple. No cervical or supraclavicular adenopathy or thyromegaly.  Lungs are clear, good air entry, no wheezes, rhonchi or rales. S1 and S2 normal, no murmurs, regular rate and rhythm.  Abdomen soft without tenderness, guarding, mass or organomegaly.  Neurological is normal, no focal findings.  BREAST EXAM: breasts appear normal, no suspicious masses, no skin or nipple changes or axillary nodes  PELVIC EXAM: VULVA: normal appearing vulva with no masses, tenderness or lesions, VAGINA: normal appearing vagina with normal color and discharge, no lesions, CERVIX: normal appearing cervix without discharge or lesions, lesion present: 5-10 mm cervical polyp located at 10:00 on the cervix just outside of the external cervical os, UTERUS: uterus is normal size, shape, consistency and nontender, ADNEXA: no masses, mild tenderness to deeper palpation in right pelvis  Procedure Note Cervical polypectomy With the cervical polyp in view through the speculum, the polyp was grasped with a ring forceps and gently twisted upon its base resulting in easy removal of the polyp which was sent to pathology for analysis.  There is a small amount of bleeding from the cervix after this procedure which was controlled with application of direct pressure  and silver nitrate cautery.  The patient tolerated the procedure well without difficulty.  Chaperone: [name] present during the examination  ASSESSMENT:  74 y.o. G1P1001 here for annual gynecologic exam, cervical polypectomy, new onset right lower quadrant pain, history of persistent low-grade abnormal Pap smear  1. Postmenopausal.  No acute concerns related to this stage of her life (other than other symptoms addressed  separately below). 2. 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    She has been followed closely with Pap smears every 6 months, and the results had been persistently LGSIL, until 03/2019 when the Pap smear did return HGSIL.  Consultation with gynecologic oncology was obtained on 04/2019.  At that encounter she did have a colposcopy which was deemed inadequate due to inability to see the entire transformation zone, and the vaginal colposcopy biopsies indicated VAIN-1 and her ECC indicated CIN-1.  The vulvar colposcopy was negative.  Dr. Skeet Latch had recommended proceeding with hysterectomy due to suspicion that the lesion is located within the endocervical canal and the cervix is fairly for flush with the vaginal vault making repeat excision likely difficult.  The patient understood that this was not urgent so she would like to take time to consider if it is necessary.  A Pap smear is repeated today.  We discussed that with the persistent abnormal Pap smears and recommendation from gynecologic oncology, that we will have an ongoing discussion moving forward regarding her wishes to pursue a hysterectomy, which I would agree is most prudent next step in her management, particularly if the Pap smear remains abnormal (especially if HGSIL) and/or if the cervical polyp is dysplastic.    3.  Cervical polyp.  This was noted on exam today and likely is the reason for her recent postcoital spotting.  Appears grossly benign.  We will have pathology analyze the specimen and I will follow-up with her result. 4. RLQ pain.  Her adnexal exam is normal today.  I do not appreciate anything on the exam that would indicate she has an ovarian cyst.  She does have a history of diverticulosis as noted above in the HPI about 90 so she plans to see her GI specialist regarding any recommendations, which I would endorse.  If she does find that the pain is not improving, and if GI does not need any further abdominal/pelvic  imaging, I do recommend that she let us know and we could consider getting a pelvic ultrasound at that point just to more definitively rule out adnexal pathology.  She agrees with this plan. 5. History of left breast cancer.  Mammogram 08/2018. Will continue with annual mammography - next is scheduled 10/24/2019.  Breast exam normal today.  Recommend SBD monthly. 6. Colonoscopy 2019. Recommended that she continue per the prescribed interval.  7. Osteopenia. DEXA 11/2018 was stable with recommendation to repeat in 2 years. 8. Health maintenance.  No lab work as she has this completed with her primary care provider.    15 additional minutes were spent in reviewing the patient's previous history, addressing current gynecologic concerns, and discussion of the management plan moving forward including potential for surgery.  Larey Days MD  10/18/19

## 2019-10-19 ENCOUNTER — Other Ambulatory Visit: Payer: Self-pay | Admitting: Gastroenterology

## 2019-10-19 DIAGNOSIS — R1030 Lower abdominal pain, unspecified: Secondary | ICD-10-CM

## 2019-10-20 LAB — PAP IG AND HPV HIGH-RISK: HPV DNA High Risk: DETECTED — AB

## 2019-10-24 ENCOUNTER — Ambulatory Visit: Payer: Medicare Other

## 2019-10-24 NOTE — Progress Notes (Signed)
Please relay to patient:  Hi Christina Edwards,  The pap smear indicates a low grade abnormal result.  I would recommend further investigation with a cervical cone biopsy, or giving further thought to a hysterectomy - both of which I am happy to discuss further with you at an appointment by phone or video.  Dr. Delilah Shan

## 2019-10-24 NOTE — Progress Notes (Signed)
Thank you :)

## 2019-10-27 ENCOUNTER — Ambulatory Visit
Admission: RE | Admit: 2019-10-27 | Discharge: 2019-10-27 | Disposition: A | Discharge status: Home / Self Care | Payer: Medicare Other | Source: Ambulatory Visit | Attending: Gastroenterology | Admitting: Gastroenterology

## 2019-10-27 DIAGNOSIS — R1030 Lower abdominal pain, unspecified: Secondary | ICD-10-CM

## 2019-10-27 MED ORDER — IOPAMIDOL (ISOVUE-300) INJECTION 61%
100.0000 mL | Freq: Once | INTRAVENOUS | Status: AC | PRN
  Administered 2019-10-27: 100 mL via INTRAVENOUS

## 2019-11-14 ENCOUNTER — Other Ambulatory Visit: Payer: Self-pay

## 2019-11-15 ENCOUNTER — Encounter: Payer: Self-pay | Admitting: Obstetrics and Gynecology

## 2019-11-15 ENCOUNTER — Ambulatory Visit (INDEPENDENT_AMBULATORY_CARE_PROVIDER_SITE_OTHER): Payer: Medicare Other | Admitting: Obstetrics and Gynecology

## 2019-11-15 VITALS — BP 118/76

## 2019-11-15 DIAGNOSIS — R1030 Lower abdominal pain, unspecified: Secondary | ICD-10-CM

## 2019-11-15 DIAGNOSIS — D259 Leiomyoma of uterus, unspecified: Secondary | ICD-10-CM | POA: Diagnosis not present

## 2019-11-15 DIAGNOSIS — R87612 Low grade squamous intraepithelial lesion on cytologic smear of cervix (LGSIL): Secondary | ICD-10-CM

## 2019-11-15 DIAGNOSIS — Z8742 Personal history of other diseases of the female genital tract: Secondary | ICD-10-CM | POA: Diagnosis not present

## 2019-11-15 NOTE — Patient Instructions (Addendum)
Cervical Conization, Care After This sheet gives you information about how to care for yourself after your procedure. Your health care provider may also give you more specific instructions. If you have problems or questions, contact your health care provider. What can I expect after the procedure? After the procedure, it is common to have:  A groggy feeling, if you were given medicine to make you fall asleep (general anesthetic).  Cramps that feel similar to menstrual cramps.  Bloody discharge or light to moderate bleeding.  Dark discharge. This discharge may look similar to coffee grounds. This is from the paste that was applied to the cervix to control bleeding. Follow these instructions at home: Medicines  Take over-the-counter and prescription medicines only as told by your health care provider.  Do not take aspirin until your health care provider says it is okay. Aspirin can cause bleeding. Activity   Rest as told by your health care provider.  Avoid activities that require great effort, such as exercises and heavy lifting, for at least 7-14 days.  Do not lift anything that is heavier than 10 lb (4.5 kg), or the limit that you are told, until your health care provider says that it is safe.  Return to your normal activities as told by your health care provider. Ask your health care provider what activities are safe for you. General instructions   You may resume your normal diet unless your health care provider advises you not to do so.  Drink enough fluid to keep your urine pale yellow.  Do not take baths, swim, or use a hot tub until your health care provider approves. Ask your health care provider if you may take showers. You may only be allowed to take sponge baths.  Do not douche, have sex, or put anything in the vagina, including tampons, until your health care provider says it is okay.  Keep all follow-up visits as told by your health care provider. This is  important. Contact a health care provider if:  You develop a rash.  You are dizzy or light-headed.  You feel nauseous or you vomit.  You develop a bad-smelling discharge from your vagina. Get help right away if:  You have blood clots or bleeding that is heavier than a normal menstrual period. Bleeding that soaks a pad in less than 1 hour is considered heavy bleeding.  You have a fever.  You have increasing cramps.  You faint.  You have pain when you urinate.  You have severe or worsening pain that is not relieved when you take medicine.  You have bloody urine. Summary  After the procedure, it is common to have cramps, some bleeding, and dark or bloody discharge from your vagina.  Do not douche, have sex, or put anything in the vagina, including tampons, until your health care provider says it is okay.  Avoid activities that require great effort, such as exercises and heavy lifting, for at least 7-14 days.  Follow all other activity restrictions as told by your health care provider. This information is not intended to replace advice given to you by your health care provider. Make sure you discuss any questions you have with your health care provider. Document Revised: 03/07/2019 Document Reviewed: 03/07/2019 Elsevier Patient Education  [place].  Cervical Conization, Care After This sheet gives you information about how to care for yourself after your procedure. Your health care provider may also give you more specific instructions. If you have problems  or questions, contact your health care provider. What can I expect after the procedure? After the procedure, it is common to have:  A groggy feeling, if you were given medicine to make you fall asleep (general anesthetic).  Cramps that feel similar to menstrual cramps.  Bloody discharge or light to moderate bleeding.  Dark discharge. This discharge may look similar to coffee grounds. This is from the paste  that was applied to the cervix to control bleeding. Follow these instructions at home: Medicines  Take over-the-counter and prescription medicines only as told by your health care provider.  Do not take aspirin until your health care provider says it is okay. Aspirin can cause bleeding. Activity   Rest as told by your health care provider.  Avoid activities that require great effort, such as exercises and heavy lifting, for at least 7-14 days.  Do not lift anything that is heavier than 10 lb (4.5 kg), or the limit that you are told, until your health care provider says that it is safe.  Return to your normal activities as told by your health care provider. Ask your health care provider what activities are safe for you. General instructions   You may resume your normal diet unless your health care provider advises you not to do so.  Drink enough fluid to keep your urine pale yellow.  Do not take baths, swim, or use a hot tub until your health care provider approves. Ask your health care provider if you may take showers. You may only be allowed to take sponge baths.  Do not douche, have sex, or put anything in the vagina, including tampons, until your health care provider says it is okay.  Keep all follow-up visits as told by your health care provider. This is important. Contact a health care provider if:  You develop a rash.  You are dizzy or light-headed.  You feel nauseous or you vomit.  You develop a bad-smelling discharge from your vagina. Get help right away if:  You have blood clots or bleeding that is heavier than a normal menstrual period. Bleeding that soaks a pad in less than 1 hour is considered heavy bleeding.  You have a fever.  You have increasing cramps.  You faint.  You have pain when you urinate.  You have severe or worsening pain that is not relieved when you take medicine.  You have bloody urine. Summary  After the procedure, it is common to  have cramps, some bleeding, and dark or bloody discharge from your vagina.  Do not douche, have sex, or put anything in the vagina, including tampons, until your health care provider says it is okay.  Avoid activities that require great effort, such as exercises and heavy lifting, for at least 7-14 days.  Follow all other activity restrictions as told by your health care provider. This information is not intended to replace advice given to you by your health care provider. Make sure you discuss any questions you have with your health care provider. Document Revised: 03/07/2019 Document Reviewed: 03/07/2019 Elsevier Patient Education  [place].  Total Laparoscopic Hysterectomy A total laparoscopic hysterectomy is a minimally invasive surgery to remove the uterus and cervix. The fallopian tubes and ovaries can also be removed (bilateral salpingo-oophorectomy) during this surgery, if necessary. This procedure may be done to treat problems such as:  Noncancerous growths in the uterus (uterine fibroids) that cause symptoms.  A condition that causes the lining of the uterus (endometrium) to grow  in other areas (endometriosis).  Problems with pelvic support. This is caused by weakened muscles of the pelvis following vaginal childbirth or menopause.  Cancer of the cervix, ovaries, uterus, or endometrium.  Excessive (dysfunctional) uterine bleeding. This surgery is performed by inserting a thin, lighted tube (laparoscope) and surgical instruments into small incisions in the abdomen. The laparoscope sends images to a monitor. The images help the health care provider perform the procedure. After this procedure, you will no longer be able to have a baby, and you will no longer have a menstrual period. Tell a health care provider about:  Any allergies you have.  All medicines you are taking, including vitamins, herbs, eye drops, creams, and over-the-counter medicines.  Any problems you or  family members have had with anesthetic medicines.  Any blood disorders you have.  Any surgeries you have had.  Any medical conditions you have.  Whether you are pregnant or may be pregnant. What are the risks? Generally, this is a safe procedure. However, problems may occur, including:  Infection.  Bleeding.  Blood clots in the legs or lungs.  Allergic reactions to medicines.  Damage to other structures or organs.  The risk that the surgery may have to be switched to the regular one in which a large incision is made in the abdomen (abdominal hysterectomy). What happens before the procedure? Staying hydrated Follow instructions from your health care provider about hydration, which may include:  Up to 2 hours before the procedure - you may continue to drink clear liquids, such as water, clear fruit juice, black coffee, and plain tea Eating and drinking restrictions Follow instructions from your health care provider about eating and drinking, which may include:  8 hours before the procedure - stop eating heavy meals or foods such as meat, fried foods, or fatty foods.  6 hours before the procedure - stop eating light meals or foods, such as toast or cereal.  6 hours before the procedure - stop drinking milk or drinks that contain milk.  2 hours before the procedure - stop drinking clear liquids. Medicines  Ask your health care provider about: ? Changing or stopping your regular medicines. This is especially important if you are taking diabetes medicines or blood thinners. ? Taking over-the-counter medicines, vitamins, herbs, and supplements. ? Taking medicines such as aspirin and ibuprofen. These medicines can thin your blood. Do not take these medicines unless your health care provider tells you to take them.  You may be given antibiotic medicine to help prevent infection.  You may be asked to take laxatives.  You may be given medicines to help prevent nausea and  vomiting after the procedure. General instructions  Ask your health care provider how your surgical site will be marked or identified.  You may be asked to shower with a germ-killing soap.  Do not use any products that contain nicotine or tobacco, such as cigarettes and e-cigarettes. If you need help quitting, ask your health care provider.  You may have an exam or testing, such as an ultrasound to determine the size and shape of your pelvic organs.  You may have a blood or urine sample taken.  This procedure can affect the way you feel about yourself. Talk with your health care provider about the physical and emotional changes hysterectomy may cause.  Plan to have someone take you home from the hospital or clinic.  Plan to have a responsible adult care for you for at least 24 hours after you leave  the hospital or clinic. This is important. What happens during the procedure?  To lower your risk of infection: ? Your health care team will wash or sanitize their hands. ? Your skin will be washed with soap. ? Hair may be removed from the surgical area.  An IV will be inserted into one of your veins.  You will be given one or more of the following: ? A medicine to help you relax (sedative). ? A medicine to make you fall asleep (general anesthetic).  You will be given antibiotic medicine through your IV.  A tube may be inserted down your throat to help you breathe during the procedure.  A gas (carbon dioxide) will be used to inflate your abdomen to allow your surgeon to see inside of your abdomen.  Three or four small incisions will be made in your abdomen.  A laparoscope will be inserted into one of your incisions. Surgical instruments will be inserted through the other incisions in order to perform the procedure.  Your uterus and cervix may be removed through your vagina or cut into small pieces and removed through the small incisions. Any other organs that need to be removed  will also be removed this way.  Carbon dioxide will be released from inside of your abdomen.  Your incisions will be closed with stitches (sutures).  A bandage (dressing) may be placed over your incisions. The procedure may vary among health care providers and hospitals. What happens after the procedure?  Your blood pressure, heart rate, breathing rate, and blood oxygen level will be monitored until the medicines you were given have worn off.  You will be given medicine for pain and nausea as needed.  Do not drive for 24 hours if you received a sedative. Summary  Total Laparoscopic hysterectomy is a procedure to remove your uterus, cervix and sometimes the fallopian tubes and ovaries.  This procedure can affect the way you feel about yourself. Talk with your health care provider about the physical and emotional changes hysterectomy may cause.  After this procedure, you will no longer be able to have a baby, and you will no longer have a menstrual period.  You will be given pain medicine to control discomfort after this procedure. This information is not intended to replace advice given to you by your health care provider. Make sure you discuss any questions you have with your health care provider. Document Revised: 08/21/2017 Document Reviewed: 11/19/2016 Elsevier Patient Education  2020 [employer].

## 2019-11-15 NOTE — Progress Notes (Signed)
Christina Edwards  1946-01-18 [id number]  HPI The patient is a 74 y.o. G1P1001 with a history of recurrent abnormal Pap smears and HPV positive status who presents today for ongoing abdominal pain.  She was seen last month for a routine annual exam.  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  At that time she was having abdominal pain and with a history of diverticulosis I recommended that she discuss her pain symptoms with GI.  She recently had a CT abdomen and pelvis, and has been following with GI.  Imaging indicated small fibroids in the uterus, but apparently no obvious acute intra-abdominal pathology that would explain her symptoms.  She states that still having the lower abdominal pain, midline to the left side.  To her it feels like strong menstrual period cramps and pressure.  Her GI doctor indicates she has diverticulosis but apparently per her understanding did not think her symptoms have anything to do with that condition.  Antiacids did not help.     Past medical history,surgical history, problem list, medications, allergies, family history and social history were all reviewed and documented as reviewed in the EPIC chart.  ROS:  Feeling well. No dyspnea or chest pain on exertion.  +abdominal pain.  No urinary tract symptoms. GYN ROS: no abnormal bleeding, + pelvic pain, no discharge, no breast pain or new or enlarging lumps on self exam. No neurological complaints.  Physical Exam  BP 118/76   LMP 09/22/2000   General: Pleasant female, no acute distress, alert and oriented PELVIC EXAM: Deferred as recently performed  Assessment 74 yo G1 P1 with recurrent abnormal Pap smears and ongoing migrating lower abdominal pain with small fibroid uterus  Plan 1.  Abnormal Pap smear history, history of VAIN-1 As we reviewed at her previous visit, recommendation from her previous gynecologic care providers and gynecologic oncology  was to move forward with hysterectomy due to a suspicion that there is a lesion in her endocervical canal.  Her cervix is quite flush with the vaginal vault due to her repeat excision procedures in the past.  Today we discussed that at the very least I would like her to undergo a cone biopsy given her recent repeat abnormal Pap smear, or, we could proceed with hysterectomy via a laparoscopic approach which would help evaluate her intra-abdominal organs to see if there is any cause for pain, and secondly, potentially allow more definitive diagnosis for the cause of her recurrent abnormal Pap smears.  I do not think it is likely that her abnormal Pap smears and small fibroid uterus have anything to do with her current abdominal pain.  I drew diagrams to explain the purpose of doing a cone biopsy.  We discussed that even if she were to choose a hysterectomy, this does not alleviate her of surveillance for HPV mediated diseases such as vaginal or vulvar intraepithelial neoplasia.  We discussed that since she has a very short cervical length remaining, it may not be even feasible to do much of a cone biopsy.  She would like to think about her options and says she will let me know how she would like to proceed.  2.  Lower abdominal pain. As discussed above, I do not think it is likely that a gynecologic organ is responsible for her symptoms.  If she is not pursuing surgery in the form of a cone biopsy or hysterectomy, then I think she needs to at least get a  pelvic ultrasound to evaluate her adnexa and uterus with more certainty.  She knows that she should get this done within the next month or so if she is still contemplating surgical evaluation at that point.   Joseph Pierini MD 11/15/19

## 2019-11-21 ENCOUNTER — Other Ambulatory Visit: Payer: Self-pay | Admitting: *Deleted

## 2019-11-21 DIAGNOSIS — R1030 Lower abdominal pain, unspecified: Secondary | ICD-10-CM

## 2019-11-21 DIAGNOSIS — D259 Leiomyoma of uterus, unspecified: Secondary | ICD-10-CM

## 2019-12-06 ENCOUNTER — Other Ambulatory Visit: Payer: Self-pay

## 2019-12-08 ENCOUNTER — Other Ambulatory Visit: Payer: Self-pay

## 2019-12-08 ENCOUNTER — Encounter: Payer: Self-pay | Admitting: Obstetrics and Gynecology

## 2019-12-08 ENCOUNTER — Ambulatory Visit (INDEPENDENT_AMBULATORY_CARE_PROVIDER_SITE_OTHER): Payer: Medicare Other

## 2019-12-08 ENCOUNTER — Ambulatory Visit: Payer: Medicare Other | Admitting: Obstetrics and Gynecology

## 2019-12-08 ENCOUNTER — Other Ambulatory Visit: Payer: Medicare Other

## 2019-12-08 ENCOUNTER — Ambulatory Visit (INDEPENDENT_AMBULATORY_CARE_PROVIDER_SITE_OTHER): Payer: Medicare Other | Admitting: Obstetrics and Gynecology

## 2019-12-08 VITALS — BP 124/76

## 2019-12-08 DIAGNOSIS — D259 Leiomyoma of uterus, unspecified: Secondary | ICD-10-CM | POA: Diagnosis not present

## 2019-12-08 DIAGNOSIS — R1030 Lower abdominal pain, unspecified: Secondary | ICD-10-CM | POA: Diagnosis not present

## 2019-12-08 DIAGNOSIS — Z8742 Personal history of other diseases of the female genital tract: Secondary | ICD-10-CM

## 2019-12-08 NOTE — Progress Notes (Signed)
Christina Edwards Kippy Crume  12-Jul-1946 [id number]  HPI The patient is a 74 y.o. G1P1001 who presents today for a pelvic ultrasound to further explore the abdominal pain that she has been having.  She has been having RLQ pain intermittently for the past few months.  See the previous notes from 10/18/2019 and 11/15/2019 for details.  She also has been having recurrent abnormal Pap smears.  Hysterectomy had been recommended to her in this past year and she would like to further discuss that today.  Past medical history,surgical history, problem list, medications, allergies, family history and social history were all reviewed and documented as reviewed in the EPIC chart.  ROS:  Feeling well. No dyspnea or chest pain on exertion.  +abdominal pain.  No urinary tract symptoms. GYN ROS: no abnormal bleeding, + pelvic pain, no discharge, no breast pain or new or enlarging lumps on self exam. No neurological complaints.  Physical Exam  BP 124/76   LMP 09/22/2000   General: Pleasant female, no acute distress, alert and oriented Abdomen: Small infraumbilical scar, right upper quadrant laparotomy scar, low transverse laparotomy scar in the midline. PELVIC EXAM: Deferred as this was performed at her recent encounter and pelvic ultrasound obtained today.  Pelvic ultrasound Retroverted uterus 5.9 x 4.7 x 2.9 cm Several small intramural and subserosal fibroids noted, the largest measuring 1.1 cm in maximum dimension. Small amount of fluid within endometrial cavity, 3 endometrial polyps noted measuring 17 mm, 12 mm, 9 mm.  Feeder vessels to polyps noted. Left ovary surgically absent. Right ovary small and atrophic. No adnexal masses.  No free fluid. Hypoechoic areas within walls of bowel and left lower quadrant suggest inflammation.  Also tender to palpation the ultrasound probe in the left vaginal fornix.  Assessment 74 yo G1 P1 with recurrent abnormal Pap smears and ongoing migrating lower abdominal pain  with small fibroid uterus, incidental finding of endometrial polyps  Plan I reassured the patient that there is no evidence that her abdominal pelvic pain is related to her uterus or GYN organs.  Her fibroids are very small and not likely to be causing any symptoms.  Likewise, the endometrial polyps would not be a cause for pain or discomfort, and she has not had any postmenopausal vaginal bleeding.  Her tenderness on the transvaginal ultrasound of the left lower quadrant in addition to findings of possible bowel inflammation to support the theory that her discomfort is related to a bowel origin.  We again discussed today the recommendation to proceed with hysterectomy, not due to the abdominal and pelvic pain, but because of the recurrent abnormal Pap smears.  She also has a history of VAIN-1.  She understands that by performing hysterectomy, this would not be a any sort of attempt to resolve her abdominal pain and that even with a hysterectomy and absence of cervix, HPV related changes can still occur in the vaginal tissues and she therefore would require ongoing Pap smear surveillance of the vaginal cuff and possibly vaginal colposcopy and treatments from there as needed.  I would not recommend trying another excisional biopsy attempts after further thought, because on previous exam her cervix appears to be significantly shortened from her previous conization procedures, and we discussed that this can result in inadvertent intraperitoneal entry and possible injury to bowel or other intra-abdominal organ structures when performing a repeat conization on the cervix already shortened by multiple other procedures.  Also, it would be recommended to get more tissue to more definitively rule  out diagnosis of high-grade cervical dysplasia or cervical cancer may be being missed by the colposcopy biopsies that she has had more recently.  Given the size of the uterus, and based off of the pelvic examination and  shortened cervix, along with her history of diverticulosis, endometriosis, and other intra-abdominal surgeries with risk for adhesive disease, I think a laparoscopic assisted vaginal hysterectomy approach would be the most appropriate for this patient.  We would also perform removal of the remaining ovary and fallopian tube.  I discussed the hysterectomy procedure in detail with the patient including the length of time anticipated to perform the procedure, the expectation for overnight stay in the hospital and subsequent discharge the following day, and postoperative convalescence and recovery expectations.  I discussed that she will need to plan to be off of work for potentially up to 6 weeks but may be able go back sooner a limited capacity keeping in mind lifting restrictions of 10 to 20 lb and no repetitive squatting, bending, or straining maneuvers.  Pelvic rest restriction was also reviewed.  The patient is aware that hysterectomy is considered to be a major surgical procedure. There are risks of infection, bleeding, possibility of needing a blood transfusion, injury to bowel, bladder, major pelvic vessels, ureter, nerves from positioning and or skin irritation, and deep venous thrombosis.  General anesthesia also has risks including myocardial infarction, stroke, and death.  Common scenarios for complications from surgery were reviewed with the patient including focus on bladder and/or ureteral injuries.  Generally the complication rate is less than 1%.  Conversion to laparotomy may be deemed necessary at the time of the procedure, which if performed would result in longer hospital stay, and more postoperative pain and healing time.  She will consider the recommendations and discussion from today and I will have staff reach out to her to schedule a preoperative visit if she would like to proceed.  All questions were answered by the end of the visit.   Joseph Pierini MD, FACOG 12/08/19

## 2019-12-15 ENCOUNTER — Encounter: Payer: Self-pay | Admitting: Obstetrics and Gynecology

## 2019-12-26 ENCOUNTER — Telehealth: Payer: Self-pay

## 2019-12-26 NOTE — Telephone Encounter (Signed)
Patient called back and wants 02/22/20 at 7:30am.  Surgery scheduled. Patient informed. Packet will be mailed to her.

## 2019-12-26 NOTE — Telephone Encounter (Signed)
I called patient and discussed her insurance benefits and that her insurance is paying surgeon's fees at 100% so no prepymt due. We discussed available dates and need for COVID screen and quarantine prior to surgery. She wants to talk with husband and check calendar and will call me back to schedule.

## 2020-01-06 ENCOUNTER — Encounter: Payer: Self-pay | Admitting: Anesthesiology

## 2020-01-12 ENCOUNTER — Encounter: Payer: Self-pay | Admitting: Gynecology

## 2020-01-13 ENCOUNTER — Telehealth: Payer: Self-pay

## 2020-01-13 NOTE — Telephone Encounter (Signed)
Patient called stating she wanted to cancel surgery until she can resolve some GI issues she is having. She saw GI MD today and goes back for follow up in 30 days. She wants to postpone until probably August.  I will take care of cancelling everything and letting Dr. Delilah Shan know. I will call her when August schedule is ready.

## 2020-01-16 ENCOUNTER — Ambulatory Visit
Admission: RE | Admit: 2020-01-16 | Discharge: 2020-01-16 | Disposition: A | Discharge status: Home / Self Care | Payer: Medicare Other | Source: Ambulatory Visit | Attending: Family Medicine | Admitting: Family Medicine

## 2020-01-16 ENCOUNTER — Other Ambulatory Visit: Payer: Self-pay

## 2020-01-16 DIAGNOSIS — Z1231 Encounter for screening mammogram for malignant neoplasm of breast: Secondary | ICD-10-CM

## 2020-01-16 NOTE — Telephone Encounter (Signed)
Noted! Thank you

## 2020-02-08 ENCOUNTER — Telehealth: Payer: Self-pay

## 2020-02-08 NOTE — Telephone Encounter (Signed)
I called and spoke with patient and let her know I have the August schedule available. She was not home where her calendar is and said she will call me back to schedule.

## 2020-02-17 ENCOUNTER — Other Ambulatory Visit (HOSPITAL_COMMUNITY): Payer: Medicare Other

## 2020-02-21 ENCOUNTER — Ambulatory Visit (HOSPITAL_BASED_OUTPATIENT_CLINIC_OR_DEPARTMENT_OTHER): Admit: 2020-02-21 | Payer: Medicare Other | Admitting: Obstetrics and Gynecology

## 2020-02-21 ENCOUNTER — Encounter (HOSPITAL_BASED_OUTPATIENT_CLINIC_OR_DEPARTMENT_OTHER): Payer: Self-pay

## 2020-02-21 HISTORY — PX: COLONOSCOPY: SHX174

## 2020-02-21 SURGERY — HYSTERECTOMY, VAGINAL, LAPAROSCOPY-ASSISTED, WITH SALPINGO-OOPHORECTOMY
Anesthesia: General | Laterality: Right

## 2020-02-24 ENCOUNTER — Telehealth: Payer: Self-pay

## 2020-02-24 NOTE — Telephone Encounter (Signed)
Patient called ready to schedule surgery. I scheduled her for 05/15/20 at 7:30am at [facility].  I scheduled a Covid screen and reminded her about quarantine protocol.   Packet will be sent.

## 2020-05-07 NOTE — Patient Instructions (Signed)
DUE TO COVID-19 ONLY ONE VISITOR IS ALLOWED IN WAITING ROOM (VISITOR WILL HAVE A TEMPERATURE CHECK ON ARRIVAL AND MUST WEAR A FACE MASK THE ENTIRE TIME.)  ONCE YOU ARE ADMITTED TO YOUR PRIVATE ROOM, THE SAME ONE VISITOR IS ALLOWED TO VISIT DURING VISITING HOURS ONLY.  Your COVID swab testing is scheduled for Friday, Aug. 20, 2021   At 3:15 PM  , You must self quarantine after your testing per handout given to you at the testing site. [address] [place], [place] [address]  (Must self quarantine after testing. Follow instructions on handout.)       Your procedure is scheduled on: Tuesday, Aug. 24, 2021  Report to [place] [facility] AT  5:30 A. M.   Call this number if you have problems the morning of surgery:  [phone].   OUR ADDRESS IS [place].  WE ARE LOCATED IN THE [place]                                   [facility].                                     REMEMBER:  DO NOT EAT FOOD AFTER MIDNIGHT .     MAY HAVE LIQUIDS UNTIL 4:30 AM DAY OF SURGERY.  CLEAR LIQUID DIET  Foods Allowed                                                                     Foods Excluded  Water, Black Coffee and tea, regular and decaf                             liquids that you cannot  Plain Jell-O in any flavor  (No red)                                           see through such as: Fruit ices (not with fruit pulp)                                     milk, soups, orange juice  Iced Popsicles (No red)                                    All solid food                                   Apple juices Sports drinks like Gatorade (No red) Lightly seasoned clear broth or consume(fat free) Sugar, honey syrup  Sample Menu Breakfast  Lunch                                     Supper Cranberry juice                    Beef broth                            Chicken broth Jell-O                                     Grape juice                           Apple  juice Coffee or tea                        Jell-O                                      Popsicle                                                Coffee or tea                        Coffee or tea  BRUSH YOUR TEETH THE MORNING OF SURGERY.  TAKE THESE MEDICATIONS MORNING OF SURGERY WITH A SIP OF WATER:  ZYRTEC  DO NOT WEAR JEWERLY, MAKE UP, OR NAIL POLISH.  DO NOT WEAR LOTIONS, POWDERS, PERFUMES/COLOGNE OR DEODORANT.  DO NOT SHAVE FOR 24 HOURS PRIOR TO DAY OF SURGERY.  CONTACTS, GLASSES, OR DENTURES MAY NOT BE WORN TO SURGERY.                                    [facility] IS NOT RESPONSIBLE  FOR ANY BELONGINGS.                                                                    [facility] - Preparing for Surgery Before surgery, you can play an important role.  Because skin is not sterile, your skin needs to be as free of germs as possible.  You can reduce the number of germs on your skin by washing with CHG (chlorahexidine gluconate) soap before surgery.  CHG is an antiseptic cleaner which kills germs and bonds with the skin to continue killing germs even after washing. Please DO NOT use if you have an allergy to CHG or antibacterial soaps.  If your skin becomes reddened/irritated stop using the CHG and inform your nurse when you arrive at Short Stay. Do not shave (including legs and underarms) for at least 48 hours prior to the first CHG shower.  You  may shave your face/neck.  Please follow these instructions carefully:  1.  Shower with CHG Soap the night before surgery and the  morning of surgery.  2.  If you choose to wash your hair, wash your hair first as usual with your normal  shampoo.  3.  After you shampoo, rinse your hair and body thoroughly to remove the shampoo.                             4.  Use CHG as you would any other liquid soap.  You can apply chg directly to the skin and wash.  Gently with a scrungie or clean washcloth.  5.  Apply the CHG Soap to your body ONLY FROM THE  NECK DOWN.   Do   not use on face/ open                           Wound or open sores. Avoid contact with eyes, ears mouth and   genitals (private parts).                       Wash face,  Genitals (private parts) with your normal soap.             6.  Wash thoroughly, paying special attention to the area where your    surgery  will be performed.  7.  Thoroughly rinse your body with warm water from the neck down.  8.  DO NOT shower/wash with your normal soap after using and rinsing off the CHG Soap.                9.  Pat yourself dry with a clean towel.            10.  Wear clean pajamas.            11.  Place clean sheets on your bed the night of your first shower and do not  sleep with pets. Day of Surgery : Do not apply any lotions/deodorants the morning of surgery.  Please wear clean clothes to the hospital/surgery center.  FAILURE TO FOLLOW THESE INSTRUCTIONS MAY RESULT IN THE CANCELLATION OF YOUR SURGERY  PATIENT SIGNATURE_________________________________  NURSE SIGNATURE__________________________________  ________________________________________________________________________  WHAT IS A BLOOD TRANSFUSION? Blood Transfusion Information  A transfusion is the replacement of blood or some of its parts. Blood is made up of multiple cells which provide different functions.  Red blood cells carry oxygen and are used for blood loss replacement.  White blood cells fight against infection.  Platelets control bleeding.  Plasma helps clot blood.  Other blood products are available for specialized needs, such as hemophilia or other clotting disorders. BEFORE THE TRANSFUSION  Who gives blood for transfusions?   Healthy volunteers who are fully evaluated to make sure their blood is safe. This is blood bank blood. Transfusion therapy is the safest it has ever been in the practice of medicine. Before blood is taken from a donor, a complete history is taken to make sure that person has no  history of diseases nor engages in risky social behavior (examples are intravenous drug use or sexual activity with multiple partners). The donor's travel history is screened to minimize risk of transmitting infections, such as malaria. The donated blood is tested for signs of infectious diseases, such as HIV and hepatitis. The blood is then tested to be  sure it is compatible with you in order to minimize the chance of a transfusion reaction. If you or a relative donates blood, this is often done in anticipation of surgery and is not appropriate for emergency situations. It takes many days to process the donated blood. RISKS AND COMPLICATIONS Although transfusion therapy is very safe and saves many lives, the main dangers of transfusion include:   Getting an infectious disease.  Developing a transfusion reaction. This is an allergic reaction to something in the blood you were given. Every precaution is taken to prevent this. The decision to have a blood transfusion has been considered carefully by your caregiver before blood is given. Blood is not given unless the benefits outweigh the risks. AFTER THE TRANSFUSION  Right after receiving a blood transfusion, you will usually feel much better and more energetic. This is especially true if your red blood cells have gotten low (anemic). The transfusion raises the level of the red blood cells which carry oxygen, and this usually causes an energy increase.  The nurse administering the transfusion will monitor you carefully for complications. HOME CARE INSTRUCTIONS  No special instructions are needed after a transfusion. You may find your energy is better. Speak with your caregiver about any limitations on activity for underlying diseases you may have. SEEK MEDICAL CARE IF:   Your condition is not improving after your transfusion.  You develop redness or irritation at the intravenous (IV) site. SEEK IMMEDIATE MEDICAL CARE IF:  Any of the following  symptoms occur over the next 12 hours:  Shaking chills.  You have a temperature by mouth above 102 F (38.9 C), not controlled by medicine.  Chest, back, or muscle pain.  People around you feel you are not acting correctly or are confused.  Shortness of breath or difficulty breathing.  Dizziness and fainting.  You get a rash or develop hives.  You have a decrease in urine output.  Your urine turns a dark color or changes to pink, red, or brown. Any of the following symptoms occur over the next 10 days:  You have a temperature by mouth above 102 F (38.9 C), not controlled by medicine.  Shortness of breath.  Weakness after normal activity.  The white part of the eye turns yellow (jaundice).  You have a decrease in the amount of urine or are urinating less often.  Your urine turns a dark color or changes to pink, red, or brown. Document Released: 09/05/2000 Document Revised: 12/01/2011 Document Reviewed: 04/24/2008 [facility] Patient Information 2014 [place], [place].  _______________________________________________________________________

## 2020-05-07 NOTE — Progress Notes (Addendum)
COVID Vaccine Completed: Yes Date COVID Vaccine completed: 11/14/19, 12/05/19 COVID vaccine manufacturer: Pfizer       PCP - Dr. Cipriano [place] Cardiologist - Dr. Venida Jarvis last office visit 05/24/2019 in epic  Chest x-ray - greater than 1 year EKG - greater than 1year Stress Test - N/A ECHO - 02/05/19 in epic Cardiac Cath - N/A  Sleep Study - N/A CPAP -  N/A  Fasting Blood Sugar -  N/A Checks Blood Sugar __ N/A___ times a day  Blood Thinner Instructions: N/A Aspirin Instructions: Will call surgeon about aspirin Last Dose:  Anesthesia review: N/A Patient denies shortness of breath, fever, cough and chest pain at PAT appointment   Patient verbalized understanding of instructions that were given to them at the PAT appointment. Patient was also instructed that they will need to review over the PAT instructions again at home before surgery.

## 2020-05-08 ENCOUNTER — Other Ambulatory Visit: Payer: Self-pay

## 2020-05-08 ENCOUNTER — Encounter (HOSPITAL_COMMUNITY)
Admission: RE | Admit: 2020-05-08 | Discharge: 2020-05-08 | Disposition: A | Discharge status: Home / Self Care | Payer: Medicare Other | Source: Ambulatory Visit | Attending: Obstetrics and Gynecology | Admitting: Obstetrics and Gynecology

## 2020-05-08 ENCOUNTER — Encounter (HOSPITAL_COMMUNITY): Payer: Self-pay

## 2020-05-08 DIAGNOSIS — Z01812 Encounter for preprocedural laboratory examination: Secondary | ICD-10-CM | POA: Insufficient documentation

## 2020-05-08 HISTORY — DX: Other specified postprocedural states: Z98.890

## 2020-05-08 HISTORY — DX: Basal cell carcinoma of skin, unspecified: C44.91

## 2020-05-08 HISTORY — DX: Nausea with vomiting, unspecified: R11.2

## 2020-05-08 HISTORY — DX: Sleep related leg cramps: G47.62

## 2020-05-08 HISTORY — DX: Anemia, unspecified: D64.9

## 2020-05-08 LAB — CBC
HCT: 37.2 % (ref 36.0–46.0)
Hemoglobin: 12.1 g/dL (ref 12.0–15.0)
MCH: 32.4 pg (ref 26.0–34.0)
MCHC: 32.5 g/dL (ref 30.0–36.0)
MCV: 99.5 fL (ref 80.0–100.0)
Platelets: 289 10*3/uL (ref 150–400)
RBC: 3.74 MIL/uL — ABNORMAL LOW (ref 3.87–5.11)
RDW: 13.2 % (ref 11.5–15.5)
WBC: 4.6 10*3/uL (ref 4.0–10.5)
nRBC: 0 % (ref 0.0–0.2)

## 2020-05-09 ENCOUNTER — Telehealth: Payer: Self-pay

## 2020-05-09 ENCOUNTER — Encounter: Payer: Self-pay | Admitting: Gynecology

## 2020-05-09 NOTE — Telephone Encounter (Signed)
Yes, stop aspirin 1 week before surgery

## 2020-05-09 NOTE — Telephone Encounter (Signed)
Spoke with patient and advised to d/c until after surgery.

## 2020-05-09 NOTE — Telephone Encounter (Signed)
Patient scheduled for LAVH, SO on 05/15/20. She wanted to know if she should stop her daily ASA 81mg  before surgery?

## 2020-05-10 ENCOUNTER — Encounter: Payer: Self-pay | Admitting: Obstetrics and Gynecology

## 2020-05-10 ENCOUNTER — Ambulatory Visit (INDEPENDENT_AMBULATORY_CARE_PROVIDER_SITE_OTHER): Payer: Medicare Other | Admitting: Obstetrics and Gynecology

## 2020-05-10 ENCOUNTER — Other Ambulatory Visit: Payer: Self-pay

## 2020-05-10 VITALS — BP 116/74

## 2020-05-10 DIAGNOSIS — N841 Polyp of cervix uteri: Secondary | ICD-10-CM | POA: Diagnosis not present

## 2020-05-10 DIAGNOSIS — D259 Leiomyoma of uterus, unspecified: Secondary | ICD-10-CM | POA: Diagnosis not present

## 2020-05-10 DIAGNOSIS — N840 Polyp of corpus uteri: Secondary | ICD-10-CM

## 2020-05-10 DIAGNOSIS — R1030 Lower abdominal pain, unspecified: Secondary | ICD-10-CM | POA: Diagnosis not present

## 2020-05-10 DIAGNOSIS — R87612 Low grade squamous intraepithelial lesion on cytologic smear of cervix (LGSIL): Secondary | ICD-10-CM

## 2020-05-10 NOTE — Progress Notes (Signed)
Christina Edwards Christina Edwards [birth year] [id number]  SUBJECTIVE:   74 y.o. G1P1001 female presents for a preoperative visit before proceeding with the scheduled hysterectomy and RSO for recurrent abnormal Pap smears, inadequate colposcopy visualization, with concern that there may be a dysplastic lesion in the endocervical canal (04/2019 ECC showing mild atypia), she also had several endometrial polyps identified on ultrasound without vaginal bleeding.  She does have abdominal pain focused on the left side which seems to be bowel related.  She has a history of endometriosis and an LSO.  Current Outpatient Medications  Medication Sig Dispense Refill  . aspirin EC 81 MG tablet Take 81 mg by mouth in the morning. Swallow whole.    . calcium carbonate (TUMS - DOSED IN MG ELEMENTAL CALCIUM) 500 MG chewable tablet Chew 1 tablet by mouth as needed.     . cetirizine (ZYRTEC) 10 MG tablet Take 10 mg by mouth daily.    Marland Kitchen ibuprofen (ADVIL) 200 MG tablet Take 200 mg by mouth every 8 (eight) hours as needed for mild pain.    . rosuvastatin (CRESTOR) 5 MG tablet Take 1 tablet (5 mg total) by mouth daily at 6 PM. 90 tablet 2   No current facility-administered medications for this visit.   Allergies: Sulfa antibiotics, Adhesive [tape], and Neosporin [neomycin-bacitracin zn-polymyx]  Patient's last menstrual period was 09/22/2000.  Past medical history,surgical history, problem list, medications, allergies, family history and social history were all reviewed and documented as reviewed in the EPIC chart.   OBJECTIVE:  BP 116/74   LMP 09/22/2000  The patient appears well, alert, oriented x 3, in no distress. PELVIC EXAM: VULVA: normal appearing vulva with no masses, tenderness or lesions, VAGINA: normal appearing vagina with normal color and discharge, no lesions, CERVIX: atrophic appearing cervix without discharge or lesions, minimal visible cervical length, UTERUS: uterus is normal postmenopausal size, shape,  consistency and nontender, ADNEXA: no masses, mild tenderness to deeper palpation in right pelvis  Chaperone: [name] present during the examination  ASSESSMENT:  74 y.o. G1P1001 here for preoperative discussion of planned upcoming hysterectomy  PLAN:  As we reviewed at her previous visit, recommendation from her previous gynecologic care providers and gynecologic oncology was to move forward with hysterectomy due to a suspicion that there is a lesion in her endocervical canal.  Her cervix is quite flush with the vaginal vault due to her repeat excision procedures in the past.   She was given the option of doing a cone biopsy (which would likely be difficult and may even result in inadvertent intraperitoneal entry), or a hysterectomy.  She has decided that she wants to proceed with hysterectomy via a laparoscopic approach which would help evaluate her intra-abdominal organs to see if there is any cause for pain, and secondly, potentially allow more definitive diagnosis for the cause of her recurrent abnormal Pap smears with removal of the remaining cervix.  I do not think it is likely that her abnormal Pap smears and small fibroid uterus have anything to do with her current abdominal pain.  We again discussed that hysterectomy does not alleviate her of surveillance for HPV mediated diseases such as vaginal or vulvar intraepithelial neoplasia, and those issues may represent after the surgery.  She understands that by performing hysterectomy, this would not necessarily address or resolve her abdominal pain.  Given the size of the uterus, and based off of the pelvic examination and shortened cervix, along with her history of diverticulosis, endometriosis, and other intra-abdominal surgeries  with risk for adhesive disease, I think a total laparoscopic hysterectomy approach would be the most appropriate for this patient.  We would also perform removal of the remaining ovary and fallopian tube.  I discussed  the hysterectomy procedure in detail with the patient including the length of time anticipated to perform the procedure, the possibility for same-day discharge versus overnight stay in the hospital and subsequent discharge the following day, and postoperative convalescence and recovery expectations.  I discussed that she will need to plan to be off of work for potentially up to 6 weeks but may be able go back sooner a limited capacity keeping in mind lifting restrictions of 10 to 20 lb and no repetitive squatting, bending, or straining maneuvers.  Pelvic rest restriction was also reviewed.  The patient is aware that hysterectomy is considered to be a major surgical procedure. There are risks of infection, bleeding, possibility of needing a blood transfusion, injury to bowel, bladder, major pelvic vessels, ureter, nerves from positioning and or skin irritation, and deep venous thrombosis.  General anesthesia also has risks including myocardial infarction, stroke, and death.  Common scenarios for complications from surgery were reviewed with the patient including focus on bladder and/or ureteral injuries.  Generally the complication rate is less than 1%.  Conversion to laparotomy may be deemed necessary at the time of the procedure, which if performed would result in longer hospital stay, and more postoperative pain and healing time.  We answered all of her questions regarding the surgery and will proceed as planned.   Joseph Pierini MD 05/10/20

## 2020-05-11 ENCOUNTER — Other Ambulatory Visit (HOSPITAL_COMMUNITY)
Admission: RE | Admit: 2020-05-11 | Discharge: 2020-05-11 | Disposition: A | Discharge status: Home / Self Care | Payer: Medicare Other | Source: Ambulatory Visit | Attending: Obstetrics and Gynecology | Admitting: Obstetrics and Gynecology

## 2020-05-11 DIAGNOSIS — Z20822 Contact with and (suspected) exposure to covid-19: Secondary | ICD-10-CM | POA: Insufficient documentation

## 2020-05-11 DIAGNOSIS — Z01812 Encounter for preprocedural laboratory examination: Secondary | ICD-10-CM | POA: Diagnosis present

## 2020-05-11 LAB — SARS CORONAVIRUS 2 (TAT 6-24 HRS): SARS Coronavirus 2: NEGATIVE

## 2020-05-14 ENCOUNTER — Telehealth: Payer: Self-pay

## 2020-05-14 NOTE — Telephone Encounter (Signed)
I called back again and spoke with Merrilee Seashore? And he said system update is still ongoing and to try back later.

## 2020-05-14 NOTE — Telephone Encounter (Signed)
I had a message from Dr. Delilah Shan this morning to change patient's surgery from procedure for tomorrow from [phone] to [phone].  I called [employer] to see if the prior auth for [phone] could be updated to change the CPT code.  I was advised by the rep that the case was approved and closed and no updates could be added. I will have to start over with a new PA.  She started taking my info and then apologized as they were having a system update and it will last 30-60 minutes. She said she will call me back.

## 2020-05-14 NOTE — Telephone Encounter (Addendum)
I did not receive a call back so I called again. I spoke with Charleston Ropes again and was told system is still updating and I can call back in 30 minutes.

## 2020-05-14 NOTE — Telephone Encounter (Signed)
Spoke with Ronalee Belts (?) and system was up. He took all the info and said he would send it high priority but could not guarantee PA in time for surgery.  Pending Authorization # is [id number]. They will contact me if clinicals needed.

## 2020-05-15 ENCOUNTER — Ambulatory Visit (HOSPITAL_BASED_OUTPATIENT_CLINIC_OR_DEPARTMENT_OTHER): Payer: Medicare Other | Admitting: Anesthesiology

## 2020-05-15 ENCOUNTER — Other Ambulatory Visit: Payer: Self-pay

## 2020-05-15 ENCOUNTER — Ambulatory Visit (HOSPITAL_BASED_OUTPATIENT_CLINIC_OR_DEPARTMENT_OTHER)
Admission: RE | Admit: 2020-05-15 | Discharge: 2020-05-16 | Disposition: A | Discharge status: Home / Self Care | Payer: Medicare Other | Attending: Obstetrics and Gynecology | Admitting: Obstetrics and Gynecology

## 2020-05-15 ENCOUNTER — Encounter (HOSPITAL_BASED_OUTPATIENT_CLINIC_OR_DEPARTMENT_OTHER)
Admission: RE | Disposition: A | Discharge status: Home / Self Care | Payer: Self-pay | Source: Home / Self Care | Attending: Obstetrics and Gynecology

## 2020-05-15 ENCOUNTER — Encounter (HOSPITAL_BASED_OUTPATIENT_CLINIC_OR_DEPARTMENT_OTHER): Payer: Self-pay | Admitting: Obstetrics and Gynecology

## 2020-05-15 DIAGNOSIS — D251 Intramural leiomyoma of uterus: Secondary | ICD-10-CM | POA: Insufficient documentation

## 2020-05-15 DIAGNOSIS — Z888 Allergy status to other drugs, medicaments and biological substances status: Secondary | ICD-10-CM | POA: Insufficient documentation

## 2020-05-15 DIAGNOSIS — N871 Moderate cervical dysplasia: Secondary | ICD-10-CM | POA: Diagnosis not present

## 2020-05-15 DIAGNOSIS — N83299 Other ovarian cyst, unspecified side: Secondary | ICD-10-CM | POA: Diagnosis not present

## 2020-05-15 DIAGNOSIS — Z85828 Personal history of other malignant neoplasm of skin: Secondary | ICD-10-CM | POA: Insufficient documentation

## 2020-05-15 DIAGNOSIS — Z853 Personal history of malignant neoplasm of breast: Secondary | ICD-10-CM | POA: Diagnosis not present

## 2020-05-15 DIAGNOSIS — Z87410 Personal history of cervical dysplasia: Secondary | ICD-10-CM | POA: Diagnosis present

## 2020-05-15 DIAGNOSIS — Z923 Personal history of irradiation: Secondary | ICD-10-CM | POA: Insufficient documentation

## 2020-05-15 DIAGNOSIS — K660 Peritoneal adhesions (postprocedural) (postinfection): Secondary | ICD-10-CM | POA: Insufficient documentation

## 2020-05-15 DIAGNOSIS — Z91048 Other nonmedicinal substance allergy status: Secondary | ICD-10-CM | POA: Insufficient documentation

## 2020-05-15 DIAGNOSIS — Z882 Allergy status to sulfonamides status: Secondary | ICD-10-CM | POA: Diagnosis not present

## 2020-05-15 DIAGNOSIS — Z791 Long term (current) use of non-steroidal anti-inflammatories (NSAID): Secondary | ICD-10-CM | POA: Diagnosis not present

## 2020-05-15 DIAGNOSIS — N808 Other endometriosis: Secondary | ICD-10-CM | POA: Insufficient documentation

## 2020-05-15 DIAGNOSIS — N840 Polyp of corpus uteri: Secondary | ICD-10-CM | POA: Insufficient documentation

## 2020-05-15 DIAGNOSIS — N831 Corpus luteum cyst of ovary, unspecified side: Secondary | ICD-10-CM

## 2020-05-15 DIAGNOSIS — K219 Gastro-esophageal reflux disease without esophagitis: Secondary | ICD-10-CM | POA: Insufficient documentation

## 2020-05-15 DIAGNOSIS — Z7982 Long term (current) use of aspirin: Secondary | ICD-10-CM | POA: Diagnosis not present

## 2020-05-15 DIAGNOSIS — Z881 Allergy status to other antibiotic agents status: Secondary | ICD-10-CM | POA: Insufficient documentation

## 2020-05-15 DIAGNOSIS — Z79899 Other long term (current) drug therapy: Secondary | ICD-10-CM | POA: Diagnosis not present

## 2020-05-15 DIAGNOSIS — K565 Intestinal adhesions [bands], unspecified as to partial versus complete obstruction: Secondary | ICD-10-CM

## 2020-05-15 DIAGNOSIS — D259 Leiomyoma of uterus, unspecified: Secondary | ICD-10-CM | POA: Diagnosis not present

## 2020-05-15 DIAGNOSIS — N800 Endometriosis of uterus: Secondary | ICD-10-CM

## 2020-05-15 DIAGNOSIS — N83291 Other ovarian cyst, right side: Secondary | ICD-10-CM

## 2020-05-15 DIAGNOSIS — M8580 Other specified disorders of bone density and structure, unspecified site: Secondary | ICD-10-CM | POA: Diagnosis not present

## 2020-05-15 HISTORY — PX: CYSTOSCOPY: SHX5120

## 2020-05-15 HISTORY — PX: LAPAROSCOPIC SALPINGO OOPHERECTOMY: SHX5927

## 2020-05-15 HISTORY — PX: LAPAROSCOPIC HYSTERECTOMY: SHX1926

## 2020-05-15 LAB — TYPE AND SCREEN
ABO/RH(D): O POS
Antibody Screen: NEGATIVE

## 2020-05-15 LAB — ABO/RH: ABO/RH(D): O POS

## 2020-05-15 LAB — HEMOGLOBIN: Hemoglobin: 12.6 g/dL (ref 12.0–15.0)

## 2020-05-15 SURGERY — HYSTERECTOMY, TOTAL, LAPAROSCOPIC
Anesthesia: General | Site: Bladder | Laterality: Right

## 2020-05-15 MED ORDER — KETOROLAC TROMETHAMINE 30 MG/ML IJ SOLN
INTRAMUSCULAR | Status: AC
  Filled 2020-05-15: qty 1

## 2020-05-15 MED ORDER — OXYCODONE HCL 5 MG PO TABS: 5.0000 mg | ORAL_TABLET | Freq: Once | ORAL | Status: DC | PRN

## 2020-05-15 MED ORDER — SUGAMMADEX SODIUM 200 MG/2ML IV SOLN
INTRAVENOUS | Status: DC | PRN
  Administered 2020-05-15: 200 mg via INTRAVENOUS

## 2020-05-15 MED ORDER — LIDOCAINE 2% (20 MG/ML) 5 ML SYRINGE
INTRAMUSCULAR | Status: AC
  Filled 2020-05-15: qty 5

## 2020-05-15 MED ORDER — ACETAMINOPHEN 160 MG/5ML PO SOLN: 1000.0000 mg | Freq: Once | ORAL | Status: AC | PRN

## 2020-05-15 MED ORDER — ACETAMINOPHEN 500 MG PO TABS
ORAL_TABLET | ORAL | Status: AC
  Filled 2020-05-15: qty 2

## 2020-05-15 MED ORDER — ACETAMINOPHEN 500 MG PO TABS
1000.0000 mg | ORAL_TABLET | Freq: Four times a day (QID) | ORAL | Status: DC
  Administered 2020-05-15 – 2020-05-16 (×4): 1000 mg via ORAL

## 2020-05-15 MED ORDER — KETOROLAC TROMETHAMINE 30 MG/ML IJ SOLN
30.0000 mg | Freq: Four times a day (QID) | INTRAMUSCULAR | Status: AC
  Administered 2020-05-15 – 2020-05-16 (×4): 30 mg via INTRAVENOUS

## 2020-05-15 MED ORDER — DEXAMETHASONE SODIUM PHOSPHATE 10 MG/ML IJ SOLN
INTRAMUSCULAR | Status: DC | PRN
  Administered 2020-05-15: 8 mg via INTRAVENOUS

## 2020-05-15 MED ORDER — OXYCODONE HCL 5 MG PO TABS
ORAL_TABLET | ORAL | Status: AC
  Filled 2020-05-15: qty 1

## 2020-05-15 MED ORDER — LIDOCAINE HCL (CARDIAC) PF 100 MG/5ML IV SOSY
PREFILLED_SYRINGE | INTRAVENOUS | Status: DC | PRN
  Administered 2020-05-15: 60 mg via INTRAVENOUS

## 2020-05-15 MED ORDER — BUPIVACAINE HCL (PF) 0.25 % IJ SOLN
INTRAMUSCULAR | Status: DC | PRN
  Administered 2020-05-15: 8 mL

## 2020-05-15 MED ORDER — ACETAMINOPHEN 500 MG PO TABS: 1000.0000 mg | ORAL_TABLET | Freq: Four times a day (QID) | ORAL | Status: AC

## 2020-05-15 MED ORDER — DOCUSATE SODIUM 100 MG PO CAPS: 100.0000 mg | ORAL_CAPSULE | Freq: Two times a day (BID) | ORAL | Status: DC

## 2020-05-15 MED ORDER — FENTANYL CITRATE (PF) 100 MCG/2ML IJ SOLN
INTRAMUSCULAR | Status: DC | PRN
  Administered 2020-05-15: 25 ug via INTRAVENOUS
  Administered 2020-05-15 (×3): 50 ug via INTRAVENOUS
  Administered 2020-05-15: 25 ug via INTRAVENOUS
  Administered 2020-05-15 (×2): 50 ug via INTRAVENOUS

## 2020-05-15 MED ORDER — SODIUM CHLORIDE 0.9 % IR SOLN
Status: DC | PRN
  Administered 2020-05-15: 200 mL

## 2020-05-15 MED ORDER — OXYCODONE HCL 5 MG PO TABS
5.0000 mg | ORAL_TABLET | ORAL | Status: DC | PRN
  Administered 2020-05-15 – 2020-05-16 (×5): 5 mg via ORAL

## 2020-05-15 MED ORDER — IBUPROFEN 600 MG PO TABS: 600.0000 mg | ORAL_TABLET | Freq: Four times a day (QID) | ORAL | 1 refills | Status: AC

## 2020-05-15 MED ORDER — SCOPOLAMINE 1 MG/3DAYS TD PT72
MEDICATED_PATCH | TRANSDERMAL | Status: AC
  Filled 2020-05-15: qty 1

## 2020-05-15 MED ORDER — DOCUSATE SODIUM 100 MG PO CAPS
ORAL_CAPSULE | ORAL | Status: AC
  Filled 2020-05-15: qty 1

## 2020-05-15 MED ORDER — FENTANYL CITRATE (PF) 100 MCG/2ML IJ SOLN
25.0000 ug | INTRAMUSCULAR | Status: DC | PRN
  Administered 2020-05-15 (×4): 25 ug via INTRAVENOUS

## 2020-05-15 MED ORDER — ACETAMINOPHEN 500 MG PO TABS: 1000.0000 mg | ORAL_TABLET | Freq: Once | ORAL | Status: AC | PRN

## 2020-05-15 MED ORDER — LACTATED RINGERS IV SOLN: INTRAVENOUS | Status: DC

## 2020-05-15 MED ORDER — SCOPOLAMINE 1 MG/3DAYS TD PT72
1.0000 | MEDICATED_PATCH | TRANSDERMAL | Status: DC
  Administered 2020-05-15: 1.5 mg via TRANSDERMAL

## 2020-05-15 MED ORDER — ONDANSETRON HCL 4 MG/2ML IJ SOLN
INTRAMUSCULAR | Status: AC
  Filled 2020-05-15: qty 2

## 2020-05-15 MED ORDER — ONDANSETRON HCL 4 MG/2ML IJ SOLN: 4.0000 mg | Freq: Four times a day (QID) | INTRAMUSCULAR | Status: DC | PRN

## 2020-05-15 MED ORDER — DOCUSATE SODIUM 100 MG PO CAPS
100.0000 mg | ORAL_CAPSULE | Freq: Two times a day (BID) | ORAL | Status: DC
  Administered 2020-05-15 – 2020-05-16 (×2): 100 mg via ORAL

## 2020-05-15 MED ORDER — MIDAZOLAM HCL 2 MG/2ML IJ SOLN
INTRAMUSCULAR | Status: AC
  Filled 2020-05-15: qty 2

## 2020-05-15 MED ORDER — PROPOFOL 10 MG/ML IV BOLUS
INTRAVENOUS | Status: AC
  Filled 2020-05-15: qty 20

## 2020-05-15 MED ORDER — CEFAZOLIN SODIUM-DEXTROSE 2-4 GM/100ML-% IV SOLN
INTRAVENOUS | Status: AC
  Filled 2020-05-15: qty 100

## 2020-05-15 MED ORDER — FENTANYL CITRATE (PF) 100 MCG/2ML IJ SOLN
INTRAMUSCULAR | Status: AC
  Filled 2020-05-15: qty 2

## 2020-05-15 MED ORDER — OXYCODONE HCL 5 MG/5ML PO SOLN: 5.0000 mg | Freq: Once | ORAL | Status: DC | PRN

## 2020-05-15 MED ORDER — ONDANSETRON HCL 4 MG/2ML IJ SOLN
INTRAMUSCULAR | Status: DC | PRN
  Administered 2020-05-15 (×2): 4 mg via INTRAVENOUS

## 2020-05-15 MED ORDER — FENTANYL CITRATE (PF) 250 MCG/5ML IJ SOLN
INTRAMUSCULAR | Status: AC
Start: 2020-05-15 — End: ?
  Filled 2020-05-15: qty 5

## 2020-05-15 MED ORDER — ONDANSETRON HCL 4 MG PO TABS: 4.0000 mg | ORAL_TABLET | Freq: Four times a day (QID) | ORAL | Status: DC | PRN

## 2020-05-15 MED ORDER — SODIUM CHLORIDE 0.9 % IV SOLN
INTRAVENOUS | Status: AC | PRN
  Administered 2020-05-15: 300 mL

## 2020-05-15 MED ORDER — PROPOFOL 10 MG/ML IV BOLUS
INTRAVENOUS | Status: DC | PRN
  Administered 2020-05-15: 140 mg via INTRAVENOUS

## 2020-05-15 MED ORDER — MIDAZOLAM HCL 5 MG/5ML IJ SOLN
INTRAMUSCULAR | Status: DC | PRN
  Administered 2020-05-15 (×2): 1 mg via INTRAVENOUS

## 2020-05-15 MED ORDER — ACETAMINOPHEN 10 MG/ML IV SOLN: 1000.0000 mg | Freq: Once | INTRAVENOUS | Status: DC | PRN

## 2020-05-15 MED ORDER — CEFAZOLIN SODIUM-DEXTROSE 2-4 GM/100ML-% IV SOLN
2.0000 g | INTRAVENOUS | Status: AC
  Administered 2020-05-15: 2 g via INTRAVENOUS

## 2020-05-15 MED ORDER — IBUPROFEN 800 MG PO TABS: 800.0000 mg | ORAL_TABLET | Freq: Four times a day (QID) | ORAL | Status: DC

## 2020-05-15 MED ORDER — POVIDONE-IODINE 10 % EX SWAB: 2.0000 "application " | Freq: Once | CUTANEOUS | Status: DC

## 2020-05-15 MED ORDER — OXYCODONE HCL 5 MG PO TABS
5.0000 mg | ORAL_TABLET | Freq: Once | ORAL | 0 refills | Status: DC | PRN
Start: 2020-05-15 — End: 2020-06-12

## 2020-05-15 MED ORDER — BUPIVACAINE HCL (PF) 0.25 % IJ SOLN: 20.0000 mL | Freq: Once | INTRAMUSCULAR | Status: DC

## 2020-05-15 MED ORDER — ROCURONIUM BROMIDE 100 MG/10ML IV SOLN
INTRAVENOUS | Status: DC | PRN
  Administered 2020-05-15: 60 mg via INTRAVENOUS
  Administered 2020-05-15: 10 mg via INTRAVENOUS

## 2020-05-15 MED ORDER — ROCURONIUM BROMIDE 10 MG/ML (PF) SYRINGE
PREFILLED_SYRINGE | INTRAVENOUS | Status: AC
  Filled 2020-05-15: qty 10

## 2020-05-15 SURGICAL SUPPLY — 80 items
ADH SKN CLS APL DERMABOND .7 (GAUZE/BANDAGES/DRESSINGS) ×3
APL SRG 38 LTWT LNG FL B (MISCELLANEOUS)
APPLICATOR ARISTA FLEXITIP XL (MISCELLANEOUS) IMPLANT
BAG RETRIEVAL 10 (BASKET)
BARRIER ADHS 3X4 INTERCEED (GAUZE/BANDAGES/DRESSINGS) IMPLANT
BRR ADH 4X3 ABS CNTRL BYND (GAUZE/BANDAGES/DRESSINGS)
CABLE HIGH FREQUENCY MONO STRZ (ELECTRODE) ×4 IMPLANT
CANISTER SUCT 3000ML PPV (MISCELLANEOUS) ×4 IMPLANT
CATH ROBINSON RED A/P 16FR (CATHETERS) IMPLANT
COVER MAYO STAND STRL (DRAPES) ×4 IMPLANT
COVER WAND RF STERILE (DRAPES) ×4 IMPLANT
DECANTER SPIKE VIAL GLASS SM (MISCELLANEOUS) ×3 IMPLANT
DEFOGGER SCOPE WARMER CLEARIFY (MISCELLANEOUS) ×4 IMPLANT
DERMABOND ADVANCED (GAUZE/BANDAGES/DRESSINGS) ×1
DERMABOND ADVANCED .7 DNX12 (GAUZE/BANDAGES/DRESSINGS) ×3 IMPLANT
DEVICE SUTURE ENDOST 10MM (ENDOMECHANICALS) ×4 IMPLANT
DEVICE TROCAR PUNCTURE CLOSURE (ENDOMECHANICALS) ×1 IMPLANT
DURAPREP 26ML APPLICATOR (WOUND CARE) ×4 IMPLANT
GAUZE 4X4 16PLY RFD (DISPOSABLE) ×4 IMPLANT
GLOVE BIO SURGEON STRL SZ8 (GLOVE) ×8 IMPLANT
GLOVE BIOGEL PI IND STRL 7.0 (GLOVE) ×6 IMPLANT
GLOVE BIOGEL PI IND STRL 8 (GLOVE) ×6 IMPLANT
GLOVE BIOGEL PI INDICATOR 7.0 (GLOVE) ×2
GLOVE BIOGEL PI INDICATOR 8 (GLOVE) ×2
GOWN STRL REUS W/ TWL LRG LVL3 (GOWN DISPOSABLE) ×3 IMPLANT
GOWN STRL REUS W/ TWL XL LVL3 (GOWN DISPOSABLE) ×3 IMPLANT
GOWN STRL REUS W/TWL LRG LVL3 (GOWN DISPOSABLE) ×8 IMPLANT
GOWN STRL REUS W/TWL XL LVL3 (GOWN DISPOSABLE) ×8 IMPLANT
HEMOSTAT ARISTA ABSORB 3G PWDR (HEMOSTASIS) IMPLANT
HIBICLENS CHG 4% 4OZ (MISCELLANEOUS) ×7 IMPLANT
IV NS 1000ML (IV SOLUTION) ×4
IV NS 1000ML BAXH (IV SOLUTION) IMPLANT
IV NS IRRIG 3000ML ARTHROMATIC (IV SOLUTION) ×1 IMPLANT
KIT TURNOVER CYSTO (KITS) ×4 IMPLANT
LIGASURE BLUNT TIP 5 LONG 44CM (ELECTROSURGICAL) IMPLANT
LIGASURE LAP L-HOOKWIRE 5 44CM (INSTRUMENTS) ×3 IMPLANT
LIGASURE VESSEL 5MM BLUNT TIP (ELECTROSURGICAL) ×4 IMPLANT
MANIPULATOR VCARE LG CRV RETR (MISCELLANEOUS) IMPLANT
MANIPULATOR VCARE SML CRV RETR (MISCELLANEOUS) ×1 IMPLANT
MANIPULATOR VCARE STD CRV RETR (MISCELLANEOUS) IMPLANT
NS IRRIG 500ML POUR BTL (IV SOLUTION) ×1 IMPLANT
OCCLUDER COLPOPNEUMO (BALLOONS) ×4 IMPLANT
PACK LAPAROSCOPY BASIN (CUSTOM PROCEDURE TRAY) ×4 IMPLANT
PACK TRENDGUARD 450 HYBRID PRO (MISCELLANEOUS) ×3 IMPLANT
PAD ARMBOARD 7.5X6 YLW CONV (MISCELLANEOUS) ×8 IMPLANT
PAD OB MATERNITY 4.3X12.25 (PERSONAL CARE ITEMS) ×4 IMPLANT
POUCH LAPAROSCOPIC INSTRUMENT (MISCELLANEOUS) ×3 IMPLANT
PROTECTOR NERVE ULNAR (MISCELLANEOUS) ×8 IMPLANT
SCISSORS LAP 5X35 DISP (ENDOMECHANICALS) ×1 IMPLANT
SET IRRIG Y TYPE TUR BLADDER L (SET/KITS/TRAYS/PACK) ×1 IMPLANT
SET SUCTION IRRIG HYDROSURG (IRRIGATION / IRRIGATOR) ×4 IMPLANT
SET TUBE SMOKE EVAC HIGH FLOW (TUBING) ×4 IMPLANT
SOLUTION ELECTROLUBE (MISCELLANEOUS) IMPLANT
SUT ENDO VLOC 180-0-8IN (SUTURE) ×1 IMPLANT
SUT MNCRL AB 4-0 PS2 18 (SUTURE) ×7 IMPLANT
SUT MON AB 4-0 PC3 18 (SUTURE) IMPLANT
SUT VIC AB 0 CT1 27 (SUTURE) ×4
SUT VIC AB 0 CT1 27XBRD ANBCTR (SUTURE) IMPLANT
SUT VIC AB 2-0 UR6 27 (SUTURE) IMPLANT
SUT VICRYL 0 UR6 27IN ABS (SUTURE) ×7 IMPLANT
SUT VLOC 180 0 9IN  GS21 (SUTURE) ×4
SUT VLOC 180 0 9IN GS21 (SUTURE) IMPLANT
SYR 50ML LL SCALE MARK (SYRINGE) ×4 IMPLANT
SYS BAG RETRIEVAL 10MM (BASKET)
SYSTEM BAG RETRIEVAL 10MM (BASKET) IMPLANT
SYSTEM CARTER THOMASON II (TROCAR) IMPLANT
TIP RUMI ORANGE 6.7MMX12CM (TIP) IMPLANT
TIP UTERINE 5.1X6CM LAV DISP (MISCELLANEOUS) IMPLANT
TIP UTERINE 6.7X10CM GRN DISP (MISCELLANEOUS) IMPLANT
TIP UTERINE 6.7X6CM WHT DISP (MISCELLANEOUS) IMPLANT
TIP UTERINE 6.7X8CM BLUE DISP (MISCELLANEOUS) IMPLANT
TOWEL OR 17X26 10 PK STRL BLUE (TOWEL DISPOSABLE) ×4 IMPLANT
TRAY FOLEY W/BAG SLVR 14FR (SET/KITS/TRAYS/PACK) ×3 IMPLANT
TRAY FOLEY W/BAG SLVR 14FR LF (SET/KITS/TRAYS/PACK) ×4 IMPLANT
TRENDGUARD 450 HYBRID PRO PACK (MISCELLANEOUS) ×4
TROCAR BALLN 12MMX100 BLUNT (TROCAR) IMPLANT
TROCAR BLADELESS OPT 5 100 (ENDOMECHANICALS) ×8 IMPLANT
TROCAR XCEL NON-BLD 11X100MML (ENDOMECHANICALS) ×4 IMPLANT
UNDERPAD 30X36 HEAVY ABSORB (UNDERPADS AND DIAPERS) ×4 IMPLANT
WARMER LAPAROSCOPE (MISCELLANEOUS) ×4 IMPLANT

## 2020-05-15 NOTE — Op Note (Signed)
Name: Christina Edwards  Age: 74 y.o.  Date of Birth: 01/13/1946  Medical Record #: [id number]  Operative Note  Preoperative Diagnosis: Recurrent abnormal pap smear, inadequate colposcopy, history cervical dysplasia Procedure: Total laparoscopic hysterectomy, right salpingo oophorectomy, lysis of adhesions, cystoscopy Postoperative Diagnosis: same Surgeon: Joseph Pierini, MD;  Assistant: [name], MD Estimated Blood Loss: 50 mL Anesthesia: General, local with 0.25% bupivacaine Essman: Uterus, cervix, right fallopian tube and ovary Findings: 6 week sized uterus, surgically absent left fallopian tube and ovary, normal right fallopian tube and ovary.  Sigmoid adhesions to the left lateral border of the uterus and round ligament. Complications: None. Date: 05/15/20  Under general anesthesia, Christina Edwards was placed in the dorsolithotomy position with legs in Yellofin stirrups.  SCDs were applied to the lower extremities and were functioning.  She underwent bimanual exam and was prepped and draped in the usual sterile fashion. A surgical team time out was performed to verify and agree on procedure and patient consent. Two grams of IV cefazolin were administered prior to the beginning of the procedure.  A speculum was placed in the vagina, and a single toothed tenaculum was used to grasp the anterior cervix.  A uterine sound was placed to gauge the depth of uterine cavity to 7 cm. A series of Hegar dilators were used to dilate the cervix to accommodate a V-care device, and the balloon was inflated with 10 mL of air. The cervical cup was placed and carefully checked to ensure that no vaginal tissue encroached the edges of the cup.  The cup was fixed to the cervix with a #0- Vicryl suture.  Once placed, the vaginal cup was inserted and snugged up against the cervical cup and was locked in place. The speculum and tenaculum were removed.  Local anesthesia was injected in the  infraumbilical fold.  A 5 mm infraumbilical incision was made and a 5 mm Visiport trocar was inserted with the laparoscope in place utilizing the direct entry method until the trocar was visualized to be in the abdominal cavity.  Pneumoperitoneum was established to 15 mmHg.  Intraperitoneal position was confirmed. An 11 mm right lateral and 5 mm left lateral trocar were placed under laparoscopic guidance 3 cm superior and 2 cm medial to the anterior superior iliac spines, bilaterally, after injection with local anesthesia and transillumination of the abdominal wall performed to avoid vessel injury.  Inspection of the pelvic cavity was performed.  No injury to the bowel or omentum was noted from trocar entry.  The course of bilateral pelvic ureters was identified.  Adhesion of the sigmoid colon and epiploica to the left lateral aspect of the uterus and left round ligament was bluntly dissected which helped to free the bowel a little from the area.  The remaining adhesion was thick, so attention was directed to the contralateral side of the pelvis.  The right ovary was gently grasped and the right infundibulopelvic ligament was fulgurated with the LigaSure bipolar device and transected.  The right fallopian tube was freed from the right broad ligament with the Ligasure to the cornua and left attached to the uterus.  The right round ligament was fulgurated and transected.  A bladder flap was developed with careful blunt and sharp dissection of the vesicouterine peritoneum that allowed the bladder to be dissected free from the lower uterus without concern for perforation.  Incisions into the anterior and posterior serosal surfaces allowed entry into the paracervical tissue space.  Attention was directed  to the left aspect of the uterus where the bowel adhesion remained.  Creation of the bladder flap had afforded better visualization of this area with the bladder pushed away inferiorly to allow careful dissection of  the serosal adhesions that in turn allowed the sigmoid be further pushed away from the area carefully without any concern for injury or perforation.  20 minutes were spent in dissection of this adhesion.   Additional sections of the cardinal ligaments were transected in a similar fashion bilaterally staying close to the uterus and away from the pelvic sidewall.  The assistant utilized the uterine manipulator to provide cephalad traction to move the uterus contralateral to the side of dissection.  Uterine artery, uterine vein, and anatomosing vessels were fulgurated and ligated bilaterally with the Ligasure.   Attention was then directed to the colpotomy incision. The edge of the V-care cup was identified by applying gentle cephalad force through the tissue planes on the V-care device. The monopolar endoscopic scissors were used to incise and cut within a shallow groove of the presenting face of the V-care cup. Hemostasis was accomplished with light fulguration.  The uterus was removed vaginally without difficulty.  The uterus and cervix with attached right fallopian tube and ovary were sent as a specimen to pathology.  Pneumoperitoneum was established again as the pneumo-occlusion device was inflated.  The vaginal cuff was closed with a running V-Lock suture using the Covidien Endo-stitch, avoiding bladder, and taking care to incorporate vaginal epithelium and posterior peritoneum as much as possible.  The pelvis was irrigated and suctioned with the Conmed suction-irrigator.  Pneumoperitoneum was evacuated as the pedicles and vaginal cuff were evaluated and noted to be hemostatic.  The Covidien Endo-close was used under direct laparoscopic visualization to introduce an #0-Vicryl across the right lower quadrant fascial opening which was tied down to reapproximate it.  Pneumoperitoneum was then evacuated and the trocars were removed with a blunt probe placed through the port to avoid entraping bowel in the trocar  incisions.  The supraumbilical and LLQ laparoscopic port incisions were each closed with #4-0 Monocryl suture with a single subdermal stitch; this suture type was used to perform a subcuticular closure of the RLQ incision.  Dermabond was applied over each of the 3 incisions.  Instrument, sponge, and needle counts were correct at closure of peritoneum and also at the conclusion of the procedure.    The urine in the catheter was clear stained, and the Foley catheter was removed.  A cystoscopy was performed using a 30 degree cystoscope.  The bladder was instilled with 300 mL of normal saline.  Neither misplaced suture nor tissue trauma was visible in the bladder.  Vigorous efflux of clear urine from the bilateral ureteral orifices was visualized.  The bladder was then drained of contents and the Foley catheter was reinserted after cleansing the urethral meatus with Betadine.  The patient tolerated the procedure well and she was taken to the recovery room in stable condition.   The surgical assist was present for the full case and was needed to assist with retraction, visualization, and wound closure.   Joseph Pierini, M.D., Cherlynn June

## 2020-05-15 NOTE — Progress Notes (Signed)
Dr. Delilah Shan called to check on patient. Informed MD of patient's stable VS, pain control with oral analgesia, incision C/D/I, abdomen soft. Also informed of patient's vaginal bleeding requiring 3 changes of ice perineal pads since admission to [facility] d/t moderate bleeding. Hgb at 1500 12.6. Will continue to monitor, pt asymptomatic.

## 2020-05-15 NOTE — Anesthesia Preprocedure Evaluation (Addendum)
Anesthesia Evaluation  Patient identified by MRN, date of birth, ID band Patient awake    Reviewed: Allergy & Precautions, NPO status , Patient's Chart, lab work & pertinent test results  History of Anesthesia Complications (+) PONV and history of anesthetic complications  Airway Mallampati: III  TM Distance: >3 FB Neck ROM: Full    Dental  (+) Dental Advisory Given, Teeth Intact   Pulmonary neg sleep apnea, neg COPD, neg recent URI, former smoker,    breath sounds clear to auscultation       Cardiovascular (-) hypertension(-) angina(-) Past MI and (-) CHF  Rhythm:Regular   1. The left ventricle has normal systolic function with an ejection  fraction of 60-65%. The cavity size was normal. Left ventricular diastolic  parameters were normal. No evidence of left ventricular regional wall  motion abnormalities.  2. The right ventricle has normal systolic function. The cavity was  normal. There is no increase in right ventricular wall thickness.  3. The TV is normal with mild TR.    Neuro/Psych negative neurological ROS  negative psych ROS   GI/Hepatic Neg liver ROS, GERD  Controlled,  Endo/Other  negative endocrine ROS  Renal/GU negative Renal ROS     Musculoskeletal negative musculoskeletal ROS (+)   Abdominal   Peds  Hematology Hgb 12.1   Anesthesia Other Findings   Reproductive/Obstetrics                            Anesthesia Physical Anesthesia Plan  ASA: II  Anesthesia Plan: General   Post-op Pain Management:    Induction: Intravenous  PONV Risk Score and Plan: 4 or greater and Ondansetron, Dexamethasone, Propofol infusion, Midazolam and Scopolamine patch - Pre-op  Airway Management Planned: Oral ETT  Additional Equipment: None  Intra-op Plan:   Post-operative Plan: Extubation in OR  Informed Consent: I have reviewed the patients History and Physical, chart, labs and  discussed the procedure including the risks, benefits and alternatives for the proposed anesthesia with the patient or authorized representative who has indicated his/her understanding and acceptance.     Dental advisory given  Plan Discussed with: CRNA and Surgeon  Anesthesia Plan Comments:         Anesthesia Quick Evaluation

## 2020-05-15 NOTE — Anesthesia Procedure Notes (Signed)
Procedure Name: Intubation Date/Time: 05/15/2020 7:38 AM Performed by: Garrel Ridgel, CRNA Pre-anesthesia Checklist: Patient identified, Emergency Drugs available, Suction available and Patient being monitored Patient Re-evaluated:Patient Re-evaluated prior to induction Oxygen Delivery Method: Circle system utilized Preoxygenation: Pre-oxygenation with 100% oxygen Induction Type: IV induction Ventilation: Mask ventilation without difficulty Laryngoscope Size: Mac and 3 Grade View: Grade II Tube type: Oral Tube size: 7.0 mm Number of attempts: 2 Airway Equipment and Method: Stylet and Oral airway Placement Confirmation: ETT inserted through vocal cords under direct vision,  positive ETCO2 and breath sounds checked- equal and bilateral Secured at: 22 cm Tube secured with: Tape Dental Injury: Teeth and Oropharynx as per pre-operative assessment

## 2020-05-15 NOTE — Anesthesia Postprocedure Evaluation (Signed)
Anesthesia Post Note  Patient: Christina Edwards  Procedure(s) Performed: HYSTERECTOMY TOTAL LAPAROSCOPIC (N/A Abdomen) CYSTOSCOPY (N/A Bladder) LAPAROSCOPIC SALPINGO OOPHORECTOMY (Right Abdomen)     Patient location during evaluation: PACU Anesthesia Type: General Level of consciousness: awake and alert Pain management: pain level controlled Vital Signs Assessment: post-procedure vital signs reviewed and stable Respiratory status: spontaneous breathing, nonlabored ventilation, respiratory function stable and patient connected to nasal cannula oxygen Cardiovascular status: blood pressure returned to baseline and stable Postop Assessment: no apparent nausea or vomiting Anesthetic complications: no   No complications documented.  Last Vitals:  Vitals:   05/15/20 1202 05/15/20 1215  BP: (!) 140/59 (!) 133/59  Pulse: (!) 54 (!) 57  Resp: 14 12  Temp: (!) 36.3 C   SpO2: 100% 98%    Last Pain:  Vitals:   05/15/20 1202  TempSrc:   PainSc: 7                  Norville Dani

## 2020-05-15 NOTE — H&P (Signed)
   Christina Edwards 28-Apr-1946 MRN: [id number]  HPI The patient is a 74 y.o. G1P1001 who presents today for scheduled total laparoscopic hysterectomy and right salpingo oophorectomy (remaining adnexal structures) and possible cystoscopy for recurrent cervical dysplasia.  No changes to her medical history since her pre op exam, denies CP, SOB, fever/chills, dysuria.  Past Medical History:  Diagnosis Date  . Anemia   . Basal cell carcinoma   . Breast cancer ([place]) 1987   Left breast intraductal  . Diverticulitis   . Endometriosis   . GERD (gastroesophageal reflux disease)   . 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 Nocturnal leg cramps   . Osteopenia 11/2018   T score -1.9 asked 9% / 1.4%  . Personal history of radiation therapy   . PONV (postoperative nausea and vomiting)     Past Surgical History:  Procedure Laterality Date  . APPENDECTOMY  1954  . BREAST LUMPECTOMY Left 1987  . BREAST SURGERY  1987   Lumpectomy  . CERVICAL BIOPSY  W/ LOOP ELECTRODE EXCISION    . COLONOSCOPY  02/2020  . EYE SURGERY     laser to fix torn retina  . HAND SURGERY     left hand  . OOPHORECTOMY  1989   LSO  . TONSILLECTOMY  1955  . TUBAL LIGATION  1984   Allergies  Allergen Reactions  . Sulfa Antibiotics Other (See Comments)    Reaction unknown  . Adhesive [Tape] Rash  . Neosporin [Neomycin-Bacitracin Zn-Polymyx] Rash   No current facility-administered medications on file prior to encounter.   Current Outpatient Medications on File Prior to Encounter  Medication Sig Dispense Refill  . aspirin EC 81 MG tablet Take 81 mg by mouth in the morning. Swallow whole.    . cetirizine (ZYRTEC) 10 MG tablet Take 10 mg by mouth daily.    Marland Kitchen ibuprofen (ADVIL) 200 MG tablet Take 200 mg by mouth every 8 (eight) hours as needed for mild pain.    . rosuvastatin (CRESTOR)  5 MG tablet Take 1 tablet (5 mg total) by mouth daily at 6 PM. 90 tablet 2  . calcium carbonate (TUMS - DOSED IN MG ELEMENTAL CALCIUM) 500 MG chewable tablet Chew 1 tablet by mouth as needed.         Physical Exam   BP (!) 150/75   Pulse 66   Temp 99.6 F (37.6 C) (Oral)   Resp 16   Ht 5\' 10"  (1.778 m)   Wt 65.6 kg   LMP 09/22/2000   SpO2 97%   BMI 20.75 kg/m    General: Pleasant female, no acute distress, alert and oriented CV: RRR, no murmurs Pulm: good respiratory effort, CTAB     Plan Proceed with TLH RSO as planned.  Same day discharge possibility reviewed, she does note a history of nausea with anesthesia.  Post operative recovery briefly reviewed.  All questions answered and the patient agrees to proceed.    Joseph Pierini, MD 05/15/20

## 2020-05-15 NOTE — Brief Op Note (Addendum)
05/15/2020  10:40 AM  PATIENT:  Christina Edwards  74 y.o. female  PRE-OPERATIVE DIAGNOSIS:  recurrent abnormal pap smear, inadequate colposcopy, history cervical dysplasia  POST-OPERATIVE DIAGNOSIS:  same  PROCEDURE:  Procedure(s) with comments: HYSTERECTOMY TOTAL LAPAROSCOPIC (N/A) - request 7:30 am OR time in [facility] block requests two hours CYSTOSCOPY (N/A) LAPAROSCOPIC SALPINGO OOPHORECTOMY (Right)  SURGEON:  Surgeon(s) and Role:    Joseph Pierini, MD - Primary    * [name], MD - Assisting   ANESTHESIA:   local and general  EBL:  50 mL   BLOOD ADMINISTERED:none  DRAINS: Urinary Catheter (Foley)   LOCAL MEDICATIONS USED:  0.25% BUPIVICAINE   SPECIMEN:  Source of Specimen:  uterus, cervix, right fallopian tube and ovary  DISPOSITION OF SPECIMEN:  PATHOLOGY  COUNTS:  YES  TOURNIQUET:  * No tourniquets in log *  DICTATION: .Note written in EPIC  PLAN OF CARE: Discharge to home after PACU  PATIENT DISPOSITION:  PACU - hemodynamically stable.   Delay start of Pharmacological VTE agent (>24hrs) due to surgical blood loss or risk of bleeding: not applicable

## 2020-05-15 NOTE — Transfer of Care (Signed)
Immediate Anesthesia Transfer of Care Note  Patient: Christina Edwards  Procedure(s) Performed: HYSTERECTOMY TOTAL LAPAROSCOPIC (N/A Abdomen) CYSTOSCOPY (N/A Bladder) LAPAROSCOPIC SALPINGO OOPHORECTOMY (Right Abdomen)  Patient Location: PACU  Anesthesia Type:General  Level of Consciousness: oriented, drowsy and patient cooperative  Airway & Oxygen Therapy: Patient Spontanous Breathing and Patient connected to face mask oxygen  Post-op Assessment: Report given to RN and Post -op Vital signs reviewed and stable  Post vital signs: Reviewed and stable  Last Vitals:  Vitals Value Taken Time  BP 142/69 05/15/20 1024  Temp    Pulse 63 05/15/20 1026  Resp 14 05/15/20 1026  SpO2 100 % 05/15/20 1026  Vitals shown include unvalidated device data.  Last Pain:  Vitals:   05/15/20 0635  TempSrc: Oral      Patients Stated Pain Goal: 4 (04/59/97 7414)  Complications: No complications documented.

## 2020-05-16 ENCOUNTER — Encounter (HOSPITAL_BASED_OUTPATIENT_CLINIC_OR_DEPARTMENT_OTHER): Payer: Self-pay | Admitting: Obstetrics and Gynecology

## 2020-05-16 DIAGNOSIS — N871 Moderate cervical dysplasia: Secondary | ICD-10-CM | POA: Diagnosis not present

## 2020-05-16 MED ORDER — OXYCODONE HCL 5 MG PO TABS
ORAL_TABLET | ORAL | Status: AC
  Filled 2020-05-16: qty 1

## 2020-05-16 MED ORDER — KETOROLAC TROMETHAMINE 30 MG/ML IJ SOLN
INTRAMUSCULAR | Status: AC
  Filled 2020-05-16: qty 1

## 2020-05-16 MED ORDER — DOCUSATE SODIUM 100 MG PO CAPS
ORAL_CAPSULE | ORAL | Status: AC
  Filled 2020-05-16: qty 1

## 2020-05-16 MED ORDER — ASPIRIN EC 81 MG PO TBEC: 81.0000 mg | DELAYED_RELEASE_TABLET | Freq: Every morning | ORAL | 11 refills | Status: AC

## 2020-05-16 MED ORDER — ACETAMINOPHEN 500 MG PO TABS
ORAL_TABLET | ORAL | Status: AC
  Filled 2020-05-16: qty 2

## 2020-05-16 NOTE — Discharge Summary (Signed)
DCSUMGYN Name: Christina Edwards  Age: 74 y.o.  Date of Birth: 12/24/1945  Medical Record #: [id number]   Discharge Summary  DISCHARGE DIAGNOSIS: 1. Recurrent abnormal pap smear 2. Inadequate colposcopy 3. History of cervical dysplasia 4. S/p Total laparoscopic hysterectomy, right salpingo oophorectomy, lysis of adhesions, cystoscopy  ADMISSION DATE: 05/15/2020 DISMISSAL DATE: 05/16/2020  CONSULTS: NONE LENGTH OF STAY: [place] was admitted for the above surgery and had no intraoperative complications.  Her postoperative course was uneventful and she is tolerating a general diet without restriction on discharge.  Vaginal bleeding had greatly decreased by POD#1.  Pain is currently controlled with oral medications and the patient is voiding and ambulating well prior to discharge.  Vital signs: BP (!) 116/49 (BP Location: Right Arm)   Pulse 61   Temp 98.4 F (36.9 C)   Resp 18   Ht 5\' 10"  (1.778 m)   Wt 65.6 kg   LMP 09/22/2000   SpO2 96%   BMI 20.75 kg/m   Patient is afebrile and normotensive at discharge. Abdomen is nondistended with positive bowel sounds.  Lungs are clear to auscultation bilaterally. Heart RRR.  Incisions clean, dry and intact x 3. No signs of erythema nor exudate. Labs are within expected limits at discharge.  Discharge medications include Ibuprofen 600 mg to be taken every six hours as needed for pain, oxycodone 5 mg to be taken every four to six hours as needed for moderate to severe pain, and appropriate use of stool softeners to avoid postoperative constipation. A common recommendation is to use Colace 100 mg by mouth two times a day as needed (hold Colace for loose stools). Potential adverse reactions to current medications were discussed.  Current Outpatient Medications  Medication Instructions  . acetaminophen (TYLENOL) 1,000 mg, Oral, Every 6 hours, Then as needed for pain  . aspirin EC 81 mg, Oral, Every morning, Swallow whole.   Will start 1 week after surgery  . calcium carbonate (TUMS - DOSED IN MG ELEMENTAL CALCIUM) 500 MG chewable tablet 1 tablet, Oral, As needed  . cetirizine (ZYRTEC) 10 mg, Oral, Daily  . docusate sodium (COLACE) 100 mg, Oral, 2 times daily, Hold for loose stool  . ibuprofen (ADVIL) 200 mg, Oral, Every 8 hours PRN  . ibuprofen (ADVIL) 600 mg, Oral, Every 6 hours, Take with food. Then as needed for mild pain  . oxyCODONE (OXY IR/ROXICODONE) 5 mg, Oral, Once PRN  . rosuvastatin (CRESTOR) 5 mg, Oral, Daily-1800    Patients may often be constipated after surgery due to prolonged periods of bedrest and use of oral narcotic analgesics. If there is no bowel movement for 3 days after surgery, or if the patient is uncomfortable and unable to pass stool, she will try one or all of the following measures: 1.  Milk of Magnesia, 30 cc by mouth every 12 hours  2.  Dulcolax suppository, one suppository per rectum every 6 hours 3.  Metamucil, Fibercon or other bulk former, used as directed 4.  Fleets Enema 5.  Prunes or Prune Juice 6.  Miralax The patient is instructed to contact the Ob/Gyn clinic if these measures are unsuccessful or accompanied by severe abdominal pain, nausea and emesis, or fever.  Emergency Medical Treatment and Active Labor Act (EMTALA):  Patient has no emergency condition and is stable for discharge.  Condition at discharge is independent but with activity restrictions of no lifting >20 lbs., no driving while on narcotic pain medications, and  nothing per vagina for six to eight weeks.  Keep any incisions clean and dry by running soapy water over them and dabbing them dry. It is best to shower for the first week while the healing process is underway; after one week it is safe to take a bath.   Patient is to refrain from placing anything in the vagina within the next six weeks.  This pelvic rest includes intercourse, douching, and tampons.  The patient will call the [facility] for problems  such as fever with chills, nausea, vomiting, constipation, heavy odorous vaginal discharge, burning, or pain associated with urination.  Also call for excessive vaginal bleeding, saturating more than one pad an hour for more than three consecutive hours.  Follow-up in the [facility] in 2 weeks for an incision check and in 6 weeks for a complete postoperative visit.  She is advised to return to clinic earlier if signs of fever, increasing pain, or other postoperative concerns arise.     Joseph Pierini, M.D. 05/16/20

## 2020-05-16 NOTE — Discharge Instructions (Signed)
Laparoscopically Assisted Vaginal Hysterectomy, Care After This sheet gives you information about how to care for yourself after your procedure. Your health care provider may also give you more specific instructions. If you have problems or questions, contact your health care provider. What can I expect after the procedure? After the procedure, it is common to have:  Soreness and numbness in your incision areas.  Abdominal pain. You will be given pain medicine to control it.  Vaginal bleeding and discharge. You will need to use a sanitary napkin after this procedure.  Sore throat from the breathing tube that was inserted during surgery. Follow these instructions at home: Medicines  Take over-the-counter and prescription medicines only as told by your health care provider.  Do not take aspirin or ibuprofen. These medicines can cause bleeding.  Do not drive or use heavy machinery while taking prescription pain medicine.  Do not drive for 24 hours if you were given a medicine to help you relax (sedative) during the procedure. Incision care   Follow instructions from your health care provider about how to take care of your incisions. Make sure you: ? Wash your hands with soap and water before you change your bandage (dressing). If soap and water are not available, use hand sanitizer. ? Change your dressing as told by your health care provider. ? Leave stitches (sutures), skin glue, or adhesive strips in place. These skin closures may need to stay in place for 2 weeks or longer. If adhesive strip edges start to loosen and curl up, you may trim the loose edges. Do not remove adhesive strips completely unless your health care provider tells you to do that.  Check your incision area every day for signs of infection. Check for: ? Redness, swelling, or pain. ? Fluid or blood. ? Warmth. ? Pus or a bad smell. Activity  Get regular exercise as told by your health care provider. You may be  told to take short walks every day and go farther each time.  Return to your normal activities as told by your health care provider. Ask your health care provider what activities are safe for you.  Do not douche, use tampons, or have sexual intercourse for at least 6 weeks, or until your health care provider gives you permission.  Do not lift anything that is heavier than 10 lb (4.5 kg), or the limit that your health care provider tells you, until he or she says that it is safe. General instructions  Do not take baths, swim, or use a hot tub until your health care provider approves. Take showers instead of baths.  Do not drive for 24 hours if you received a sedative.  Do not drive or operate heavy machinery while taking prescription pain medicine.  To prevent or treat constipation while you are taking prescription pain medicine, your health care provider may recommend that you: ? Drink enough fluid to keep your urine clear or pale yellow. ? Take over-the-counter or prescription medicines. ? Eat foods that are high in fiber, such as fresh fruits and vegetables, whole grains, and beans. ? Limit foods that are high in fat and processed sugars, such as fried and sweet foods.  Keep all follow-up visits as told by your health care provider. This is important. Contact a health care provider if:  You have signs of infection, such as: ? Redness, swelling, or pain around your incision sites. ? Fluid or blood coming from an incision. ? An incision that feels warm to the   touch. ? Pus or a bad smell coming from an incision.  Your incision breaks open.  Your pain medicine is not helping.  You feel dizzy or light-headed.  You have pain or bleeding when you urinate.  You have persistent nausea and vomiting.  You have blood, pus, or a bad-smelling discharge from your vagina. Get help right away if:  You have a fever.  You have severe abdominal pain.  You have chest pain.  You have  shortness of breath.  You faint.  You have pain, swelling, or redness in your leg.  You have heavy bleeding from your vagina. Summary  After the procedure, it is common to have abdominal pain and vaginal bleeding.  You should not drive or lift heavy objects until your health care provider says that it is safe.  Contact your health care provider if you have any symptoms of infection, excessive vaginal bleeding, nausea, vomiting, or shortness of breath. This information is not intended to replace advice given to you by your health care provider. Make sure you discuss any questions you have with your health care provider. Document Revised: 08/21/2017 Document Reviewed: 11/04/2016 Elsevier Patient Education  2020 [employer]  

## 2020-05-17 IMAGING — DX CHEST - 2 VIEW
2 series · 2 of 2 positions shown · non-contrast
Comparison: 08/22/2013

CLINICAL DATA: 72-year-old female with lumpectomy and chest pain

EXAM:
CHEST - 2 VIEW

[chest pa]
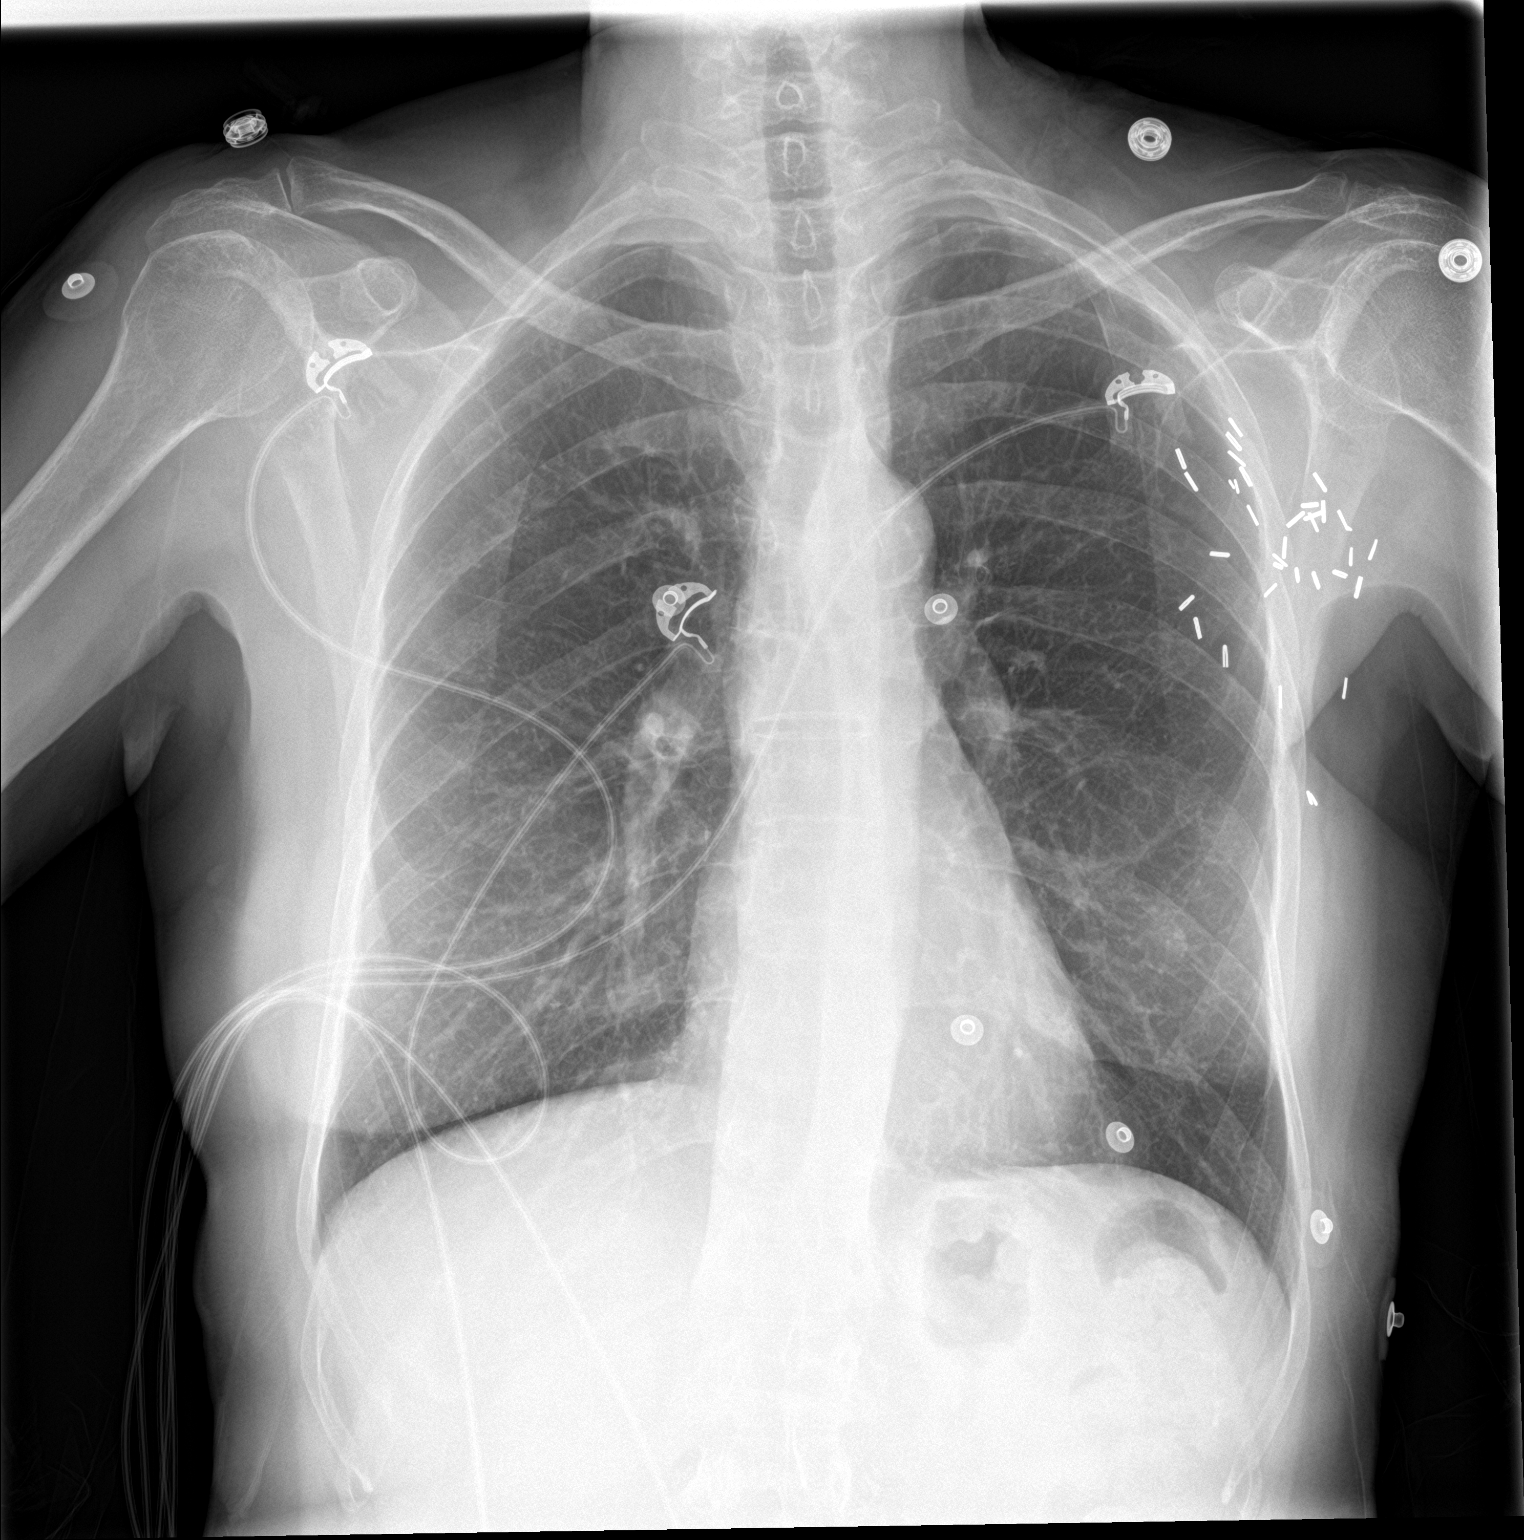

[chest lat]
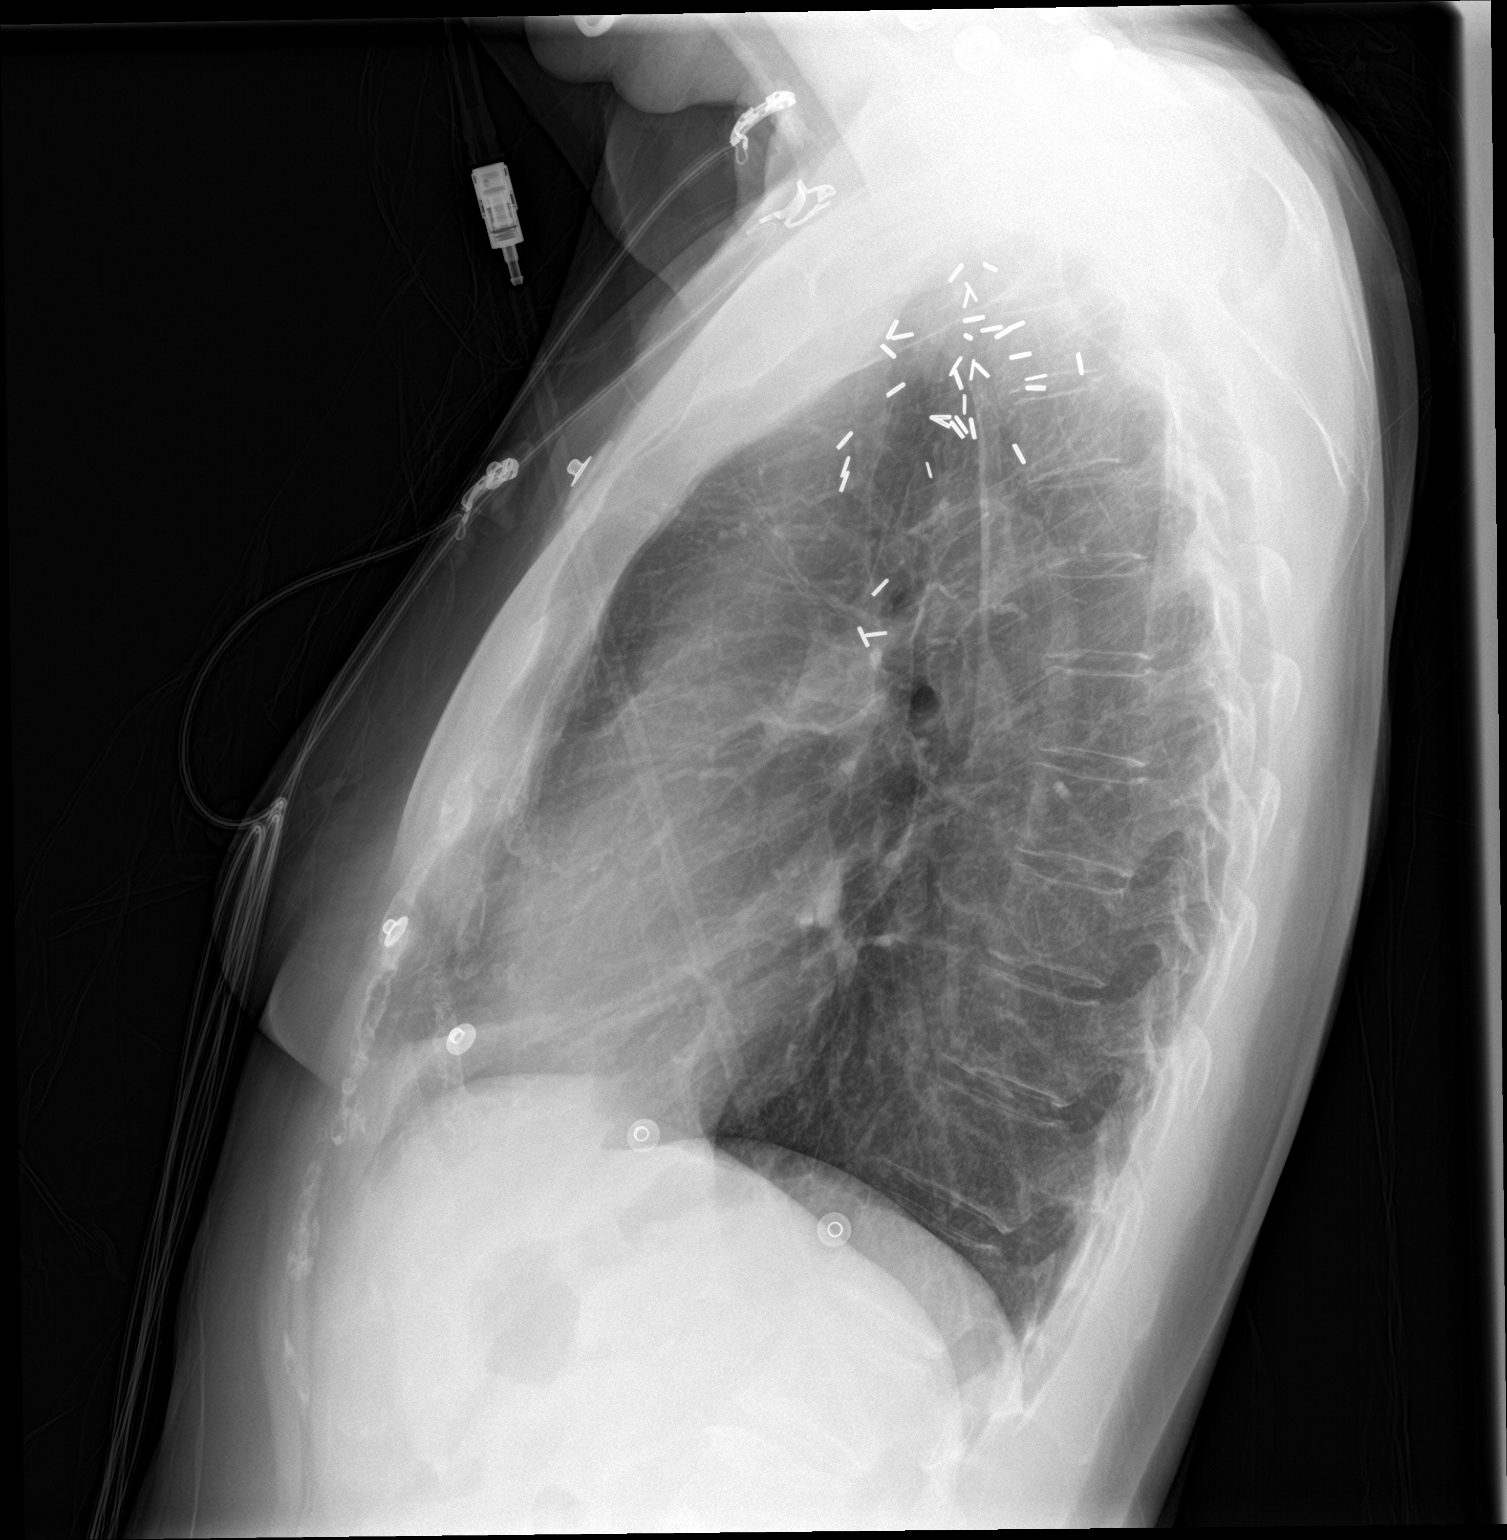

[2 of 2 positions shown; findings below may reference images not displayed]

FINDINGS: Cardiomediastinal silhouette unchanged in size and contour. Surgical
changes of the left axilla/chest.

No pneumothorax or pleural effusion. No confluent airspace disease.
No displaced fracture
IMPRESSION: Chronic lung changes without evidence of acute cardiopulmonary
disease

Surgical changes of the left axilla/chest

## 2020-05-23 LAB — SURGICAL PATHOLOGY

## 2020-05-24 ENCOUNTER — Encounter: Payer: Self-pay | Admitting: Gynecology

## 2020-05-27 ENCOUNTER — Other Ambulatory Visit: Payer: Self-pay | Admitting: Cardiology

## 2020-05-28 IMAGING — CT CT HEAR MORPH WITH CTA COR WITH SCORE WITH CA WITH CONTRAST AND
4 of 7 series · 8 of 20 positions shown, 9 images · non-contrast
Comparison: None.
COMPARISON: None.

Addendum:
EXAM:
OVER-READ INTERPRETATION  CT CHEST

The following report is an over-read performed by radiologist Dr.
Lungsta Kumm [REDACTED] on 02/15/2019. This over-read
does not include interpretation of cardiac or coronary anatomy or
pathology. The coronary CTA interpretation by the cardiologist is
attached.
CLINICAL DATA: 72F with atypical angina.
Cardiac/Coronary  CT
TECHNIQUE: The patient was scanned on a [employer] Force scanner.

[Series 6: best diast 74 % · axial · 0.39mm/px · z∈[+1270,+1317]mm · 2 of 356 slices shown]
[im 119/356  vessel]
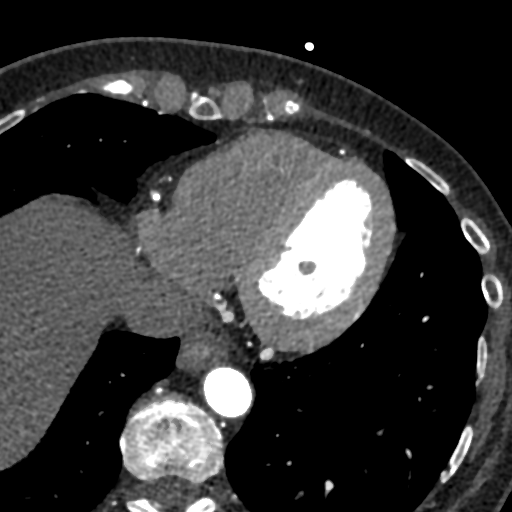
[im 237/356  vessel]
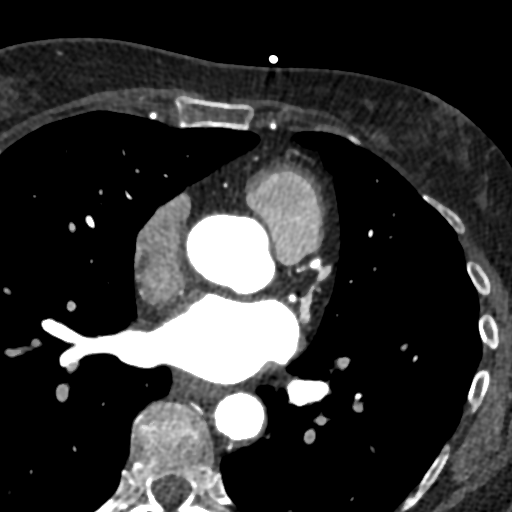

[Series 7: best syst · axial · 0.39mm/px · z∈[+1270,+1317]mm · 2 of 356 slices shown, 3 images]
[im 119/356  vessel]
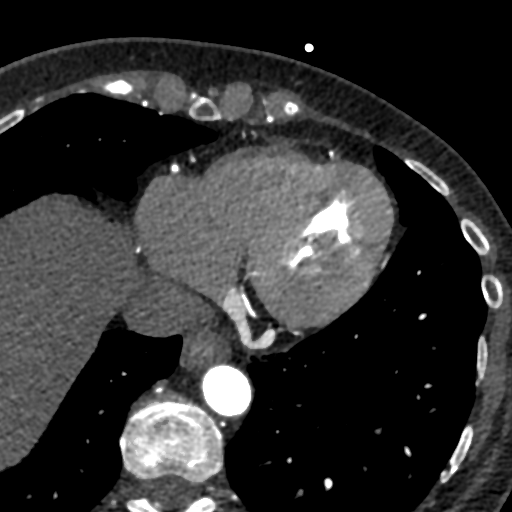
[im 119/356  lung]
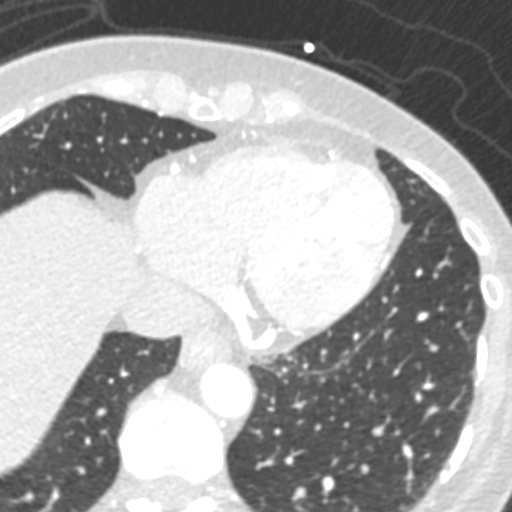
[im 237/356  vessel]
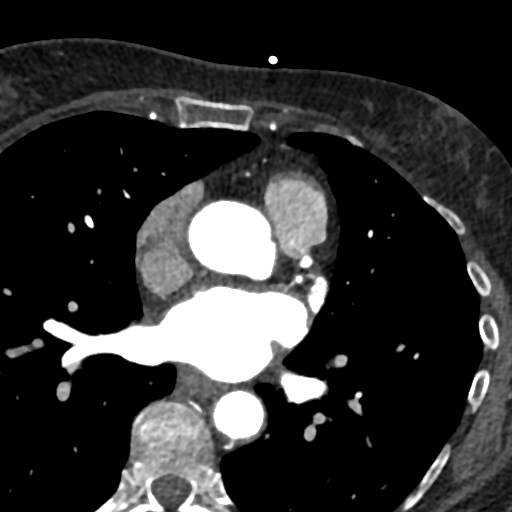

[Series 8: ts diast sharp 74 % · axial · 0.39mm/px · z∈[+1270,+1317]mm · 2 of 356 slices shown]
[im 119/356  lung]
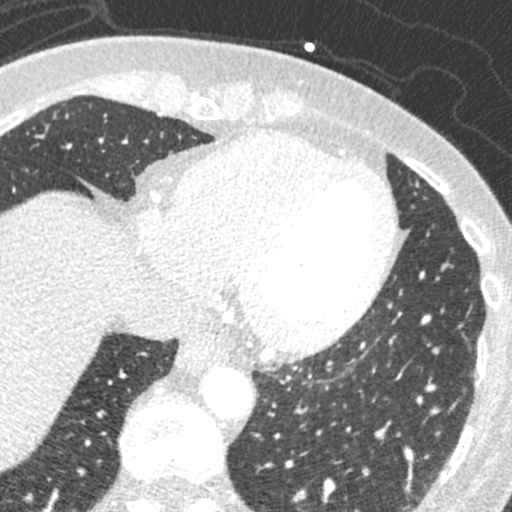
[im 237/356  lung]
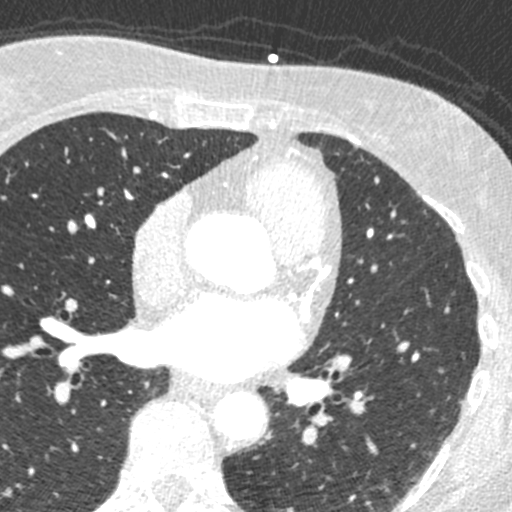

[Series 9: ts syst sharp · axial · 0.39mm/px · z∈[+1270,+1317]mm · 2 of 356 slices shown]
[im 119/356  lung]
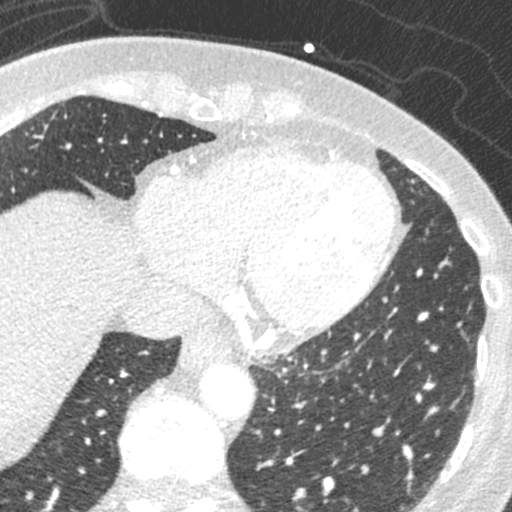
[im 237/356  lung]
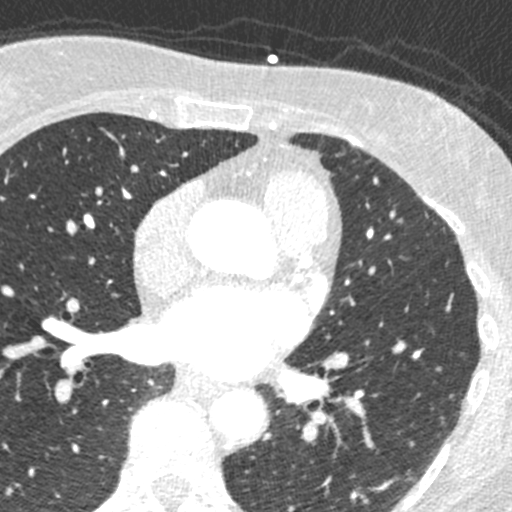

[8 of 20 positions shown; findings below may reference images not displayed]

FINDINGS: Vascular: Heart is normal size.  Aorta is normal caliber.

Mediastinum/Nodes: No adenopathy in the lower mediastinum or hila.

Lungs/Pleura: Visualized lungs clear.  No effusions.

Upper Abdomen: Imaging into the upper abdomen shows no acute
findings.

Musculoskeletal: Chest wall soft tissues are unremarkable. No acute
bony abnormality.
IMPRESSION: No acute or significant extracardiac abnormality.
FINDINGS: A 120 kV prospective scan was triggered in the descending thoracic
aorta at 111 HU's. Axial non-contrast 3 mm slices were carried out
through the heart. The data set was analyzed on a dedicated work
station and scored using the Agatson method. Gantry rotation speed
was 250 msecs and collimation was .6 mm. No beta blockade and 0.8 mg
of sl NTG was given. The 3D data set was reconstructed in 5%
intervals of the 67-82 % of the R-R cycle. Diastolic phases were
analyzed on a dedicated work station using MPR, MIP and VRT modes.
The patient received 80 cc of contrast.

Aorta: Normal size. Ascending aorta 3.1 cm. Mild calcification of
the descending aorta. No dissection.

Aortic Valve: Trileaflet. Mild calcification of the aortic annulus.

Coronary Arteries:  Normal coronary origin.  Right dominance.

RCA is a large dominant artery that gives rise to PDA and two FARLEY
branches. There is minimal (<25%) calcified plaque in the proximal
RCA.

Left main is a large artery that gives rise to LAD and LCX arteries.
There is minimal, non-calcified plaque.

LAD is a large vessel that has mild (25-49%) calcified plaque
proximally. This can be overestimated due to blooming artifact.
There are two diagonals without any significant plaque.

LCX is a non-dominant artery that gives rise to one large OM1
branch. There is no plaque.

Other findings:

Normal pulmonary vein drainage into the left atrium.

Normal let atrial appendage without a thrombus.

Normal size of the pulmonary artery.
IMPRESSION: 1. Coronary calcium score of 187. This was 79th percentile for age
and sex matched control.

2. Normal coronary origin with right dominance.

3. No evidence of obstructive CAD.

4. Minimal plaque in the LAD and RCA. Mild plaque in the proximal
LAD.

5. Recommend aggressive risk factor modification including high
potency statin.

*** End of Addendum ***
EXAM:
OVER-READ INTERPRETATION  CT CHEST

The following report is an over-read performed by radiologist Dr.
Lungsta Kumm [REDACTED] on 02/15/2019. This over-read
does not include interpretation of cardiac or coronary anatomy or
pathology. The coronary CTA interpretation by the cardiologist is
attached.
FINDINGS: Vascular: Heart is normal size.  Aorta is normal caliber.

Mediastinum/Nodes: No adenopathy in the lower mediastinum or hila.

Lungs/Pleura: Visualized lungs clear.  No effusions.

Upper Abdomen: Imaging into the upper abdomen shows no acute
findings.

Musculoskeletal: Chest wall soft tissues are unremarkable. No acute
bony abnormality.
IMPRESSION: No acute or significant extracardiac abnormality.

## 2020-05-29 ENCOUNTER — Other Ambulatory Visit: Payer: Self-pay

## 2020-05-29 ENCOUNTER — Ambulatory Visit (INDEPENDENT_AMBULATORY_CARE_PROVIDER_SITE_OTHER): Payer: Medicare Other | Admitting: Obstetrics and Gynecology

## 2020-05-29 ENCOUNTER — Encounter: Payer: Self-pay | Admitting: Obstetrics and Gynecology

## 2020-05-29 VITALS — BP 132/80

## 2020-05-29 DIAGNOSIS — N871 Moderate cervical dysplasia: Secondary | ICD-10-CM

## 2020-05-29 DIAGNOSIS — Z90710 Acquired absence of both cervix and uterus: Secondary | ICD-10-CM

## 2020-05-29 NOTE — Progress Notes (Signed)
   Christina Edwards 1946-03-26 [id number]  SUBJECTIVE   REASON FOR APPOINTMENT Chief Complaint  Patient presents with  . Routine Post Op    HYSTERECTOMY TOTAL LAPAROSCOPIC (N/A Abdomen)      HISTORY OF PRESENT ILLNESS Christina Edwards presents for routine 2 week post-operative follow up after a laparoscopic hysterectomy with bilateral salpingo-oophorectomy with lysis of adhesions and cystoscopy performed on 05/15/2020 for persistent abnormal Pap smear with inadequate colposcopy exams.  The patient is doing well with no concerns.  She denies vaginal bleeding or discharge. She is tolerating a normal diet.  Bowel and bladder function are normal.  Pain is normal and she is no longer taking pain medicine at this point.  OBJECTIVE  BP 132/80 (BP Location: Right Arm, Patient Position: Sitting, Cuff Size: Normal)   PHYSICAL EXAM General:  Patient in no acute distress.  Abdomen: Soft, non tender, non-distended.  Incisions at bilateral lower quadrants and umbilicus are clean, dry, intact, well healing, no erythema nor drainage.  Skin glue still intact.  ASSESSMENT / PLAN  Routine 2 week post operative check.   The patient is doing well and meeting all postoperative milestones.  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  We reviewed need for ongoing vaginal cuff Pap smears for the next 20 years.  The remainder of the specimen was benign and she had some small fibroids and endometrial polyps.  I showed her some pictures taken during the laparoscopy.  She will continue to follow activity restrictions as we reviewed today and return for a full postoperative visit in 4 weeks.  Follow-up if any concerns or problems before then.  Joseph Pierini, M.D.   Joseph Pierini MD 05/29/20

## 2020-06-12 ENCOUNTER — Other Ambulatory Visit: Payer: Self-pay

## 2020-06-12 ENCOUNTER — Ambulatory Visit (INDEPENDENT_AMBULATORY_CARE_PROVIDER_SITE_OTHER): Payer: Medicare Other | Admitting: Cardiology

## 2020-06-12 ENCOUNTER — Encounter: Payer: Self-pay | Admitting: Cardiology

## 2020-06-12 VITALS — BP 115/62 | HR 70 | Temp 97.5°F | Ht 69.0 in | Wt 147.8 lb

## 2020-06-12 DIAGNOSIS — Z7189 Other specified counseling: Secondary | ICD-10-CM

## 2020-06-12 DIAGNOSIS — Z79899 Other long term (current) drug therapy: Secondary | ICD-10-CM

## 2020-06-12 DIAGNOSIS — E7800 Pure hypercholesterolemia, unspecified: Secondary | ICD-10-CM | POA: Diagnosis not present

## 2020-06-12 DIAGNOSIS — I2510 Atherosclerotic heart disease of native coronary artery without angina pectoris: Secondary | ICD-10-CM

## 2020-06-12 NOTE — Progress Notes (Signed)
Cardiology Office Note:    Date:  06/12/2020   ID:  Christina Edwards, DOB Jan 24, 1946, MRN [id number]  PCP:  Carol Ada, MD  Cardiologist:  Buford Dresser, MD PhD  Referring MD: Carol Ada, MD   CC: follow up  History of Present Illness:    Christina Edwards is a 74 y.o. female with a hx of PMH breast cancer, GERD, osteopenia who was seen as a new patient during a hospitalization for chest pain. Please see my initial consult note from 02/04/19.  Cardiac history: Admitted 01/2019 with chest pain. ECG unremarkable, troponins negative x3. Elected for outpatient CTA. CTA with calcium score 187 (79th %), no obstructive CAD, minimal-mild plaque in LAD and RCA. Echo normal.  Today: Had laparoscopic TAH-BSO last month, recovering well until 9/15 when she was climbing in her new house (only framed), felt hot and like she might pass out. Since that time has had a dull headache/fogginess but no more near syncope. Has been trying to drink more fluids. No fevers/chills, mild postop pain. Just feels a bit off and jittery since that date. Has had jitteriness/hot flashes before. Feels generally weak. Has leveled off, does not feel like it is improving.  Blood pressure at home has been normal (129/67), and it is well controlled today. Not fasting today. Tolerating rosuvastatin well without issues.  Denies chest pain, shortness of breath at rest or with normal exertion. No PND, orthopnea, LE edema or unexpected weight gain. No syncope (but near syncope as above) or palpitations.  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   LGSIL on Pap smear of cervix    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    Nocturnal leg cramps      Osteopenia 11/2018   T score -1.9 asked 9% / 1.4%   Personal history of radiation therapy    PONV (postoperative nausea and vomiting)     Past Surgical History:  Procedure Laterality Date   APPENDECTOMY  1954   BREAST LUMPECTOMY Left 1987   BREAST SURGERY  1987   Lumpectomy   CERVICAL BIOPSY  W/ LOOP ELECTRODE EXCISION     COLONOSCOPY  02/2020   CYSTOSCOPY N/A 05/15/2020   Procedure: CYSTOSCOPY;  Surgeon: Joseph Pierini, MD;  Location: [facility];  Service: Gynecology;  Laterality: N/A;   EYE SURGERY     laser to fix torn retina   HAND SURGERY     left hand   LAPAROSCOPIC HYSTERECTOMY N/A 05/15/2020   Procedure: HYSTERECTOMY TOTAL LAPAROSCOPIC;  Surgeon: Joseph Pierini, MD;  Location: [facility];  Service: Gynecology;  Laterality: N/A;  request 7:30 am OR time in [place] block requests two hours   LAPAROSCOPIC SALPINGO OOPHERECTOMY Right 05/15/2020   Procedure: LAPAROSCOPIC SALPINGO OOPHORECTOMY;  Surgeon: Joseph Pierini, MD;  Location: [facility];  Service: Gynecology;  Laterality: Right;   [place]    Current Medications: Current Outpatient Medications on File Prior to Visit  Medication Sig   aspirin EC 81 MG tablet Take 1 tablet (81 mg total) by mouth in the morning. Swallow whole.  Restart  1 week after surgery   calcium carbonate (TUMS - DOSED IN MG ELEMENTAL CALCIUM) 500 MG chewable tablet Chew 1 tablet by mouth as needed.    cetirizine (ZYRTEC) 10 MG tablet Take 10 mg by mouth daily.   rosuvastatin (CRESTOR) 5 MG tablet TAKE 1 TABLET BY MOUTH  DAILY AT 6 PM   No current facility-administered medications on file prior to visit.     Allergies:   Sulfa antibiotics, Adhesive [tape], and Neosporin [neomycin-bacitracin zn-polymyx]   Social History   Tobacco Use   Smoking status: Former Smoker   Smokeless tobacco: Never Used    Tobacco comment: in college not much  Vaping Use   Vaping Use: Never used  Substance Use Topics   Alcohol use: No    Alcohol/week: 0.0 standard drinks   Drug use: No    Family History: The patient's family history includes Breast cancer in her paternal grandmother; Cancer in her mother; Heart disease in her father; Hypertension in her mother; Ovarian cancer in her mother.  ROS:   Please see the history of present illness.  Additional pertinent ROS otherwise unremarkable.  EKGs/Labs/Other Studies Reviewed:    The following studies were reviewed today: CT coronary 02/15/19 Aorta: Normal size. Ascending aorta 3.1 cm. Mild calcification of the descending aorta. No dissection. Aortic Valve: Trileaflet. Mild calcification of the aortic annulus. Coronary Arteries: Normal coronary origin. Right dominance.  RCA is a large dominant artery that gives rise to PDA and two PLV branches. There is minimal (<25%) calcified plaque in the proximal RCA.  Left main is a large artery that gives rise to LAD and LCX arteries. There is minimal, non-calcified plaque.  LAD is a large vessel that has mild (25-49%) calcified plaque proximally. This can be overestimated due to blooming artifact. There are two diagonals without any significant plaque.  LCX is a non-dominant artery that gives rise to one large OM1 branch. There is no plaque.  Other findings: Normal pulmonary vein drainage into the left atrium. Normal let atrial appendage without a thrombus. Normal size of the pulmonary artery.  IMPRESSION: 1. Coronary calcium score of 187. This was 79th percentile for age and sex matched control.  2. Normal coronary origin with right dominance.  3. No evidence of obstructive CAD.  4. Minimal plaque in the LAD and RCA. Mild plaque in the proximal LAD.  5. Recommend aggressive risk factor modification including high potency statin.  Echo 02/05/19  1. The left ventricle  has normal systolic function with an ejection fraction of 60-65%. The cavity size was normal. Left ventricular diastolic parameters were normal. No evidence of left ventricular regional wall motion abnormalities.  2. The right ventricle has normal systolic function. The cavity was normal. There is no increase in right ventricular wall thickness.  3. The TV is normal with mild TR.  EKG:  EKG is personally reviewed.  The ekg ordered today demonstrates NSR, iRBBB at 70 bpm.  Recent Labs: 05/08/2020: Platelets 289 05/15/2020: Hemoglobin 12.6  Recent Lipid Panel    Component Value Date/Time   CHOL 129 05/24/2019 0840   TRIG 111 05/24/2019 0840   HDL 54 05/24/2019 0840   CHOLHDL 2.4 05/24/2019 0840   CHOLHDL 4.2 02/05/2019 0213   VLDL 19 02/05/2019 0213   LDLCALC 55 05/24/2019 0840    Physical Exam:    VS:  BP 115/62    Pulse 70    Temp (!) 97.5 F (36.4 C)    Ht 5\' 9"  (1.753 m)  Wt 147 lb 12.8 oz (67 kg)    LMP 09/22/2000    SpO2 97%    BMI 21.83 kg/m     Wt Readings from Last 3 Encounters:  06/12/20 147 lb 12.8 oz (67 kg)  05/08/20 147 lb 4 oz (66.8 kg)  10/18/19 146 lb (66.2 kg)     GEN: Well nourished, well developed in no acute distress HEENT: Normal, moist mucous membranes NECK: No JVD CARDIAC: regular rhythm, normal S1 and S2, no rubs or gallops. No murmur. VASCULAR: Radial and DP pulses 2+ bilaterally. No carotid bruits RESPIRATORY:  Clear to auscultation without rales, wheezing or rhonchi  ABDOMEN: Soft, non-tender, non-distended MUSCULOSKELETAL:  Ambulates independently SKIN: Warm and dry, no edema NEUROLOGIC:  Alert and oriented x 3. No focal neuro deficits noted. PSYCHIATRIC:  Normal affect   ASSESSMENT:    1. Coronary artery disease involving native coronary artery of native heart without angina pectoris   2. Medication management   3. Pure hypercholesterolemia   4. Cardiac risk counseling   5. Counseling on health promotion and disease prevention    PLAN:     Nonobstructive CAD on coronary CTA: Coronary CTA without obstructive CAD but does show evidence of nonobstructive CAD -tolerating low dose rosuvastatin -no further chest pain -normal echo -tolerating aspirin 81 mg daily.  Fogginess, headache post op: no red flag symptoms. Recommended rest and hydration, but if she does not improve she will call her surgeon's office  Hyperlipidemia:  -off meds, Tchol 191, TG 95, HDL 46, LDL 126 -symptoms on atorvastatin 40 mg, improved with stopping -continue 5 mg rosuvastatin. LDL on this 55 in 05/2019 -not fasting today, will return for fasting lipids  Cardiac risk counseling and prevention recommendations: made excellent changes since hospitalization -recommend heart healthy/Mediterranean diet, with whole grains, fruits, vegetable, fish, lean meats, nuts, and olive oil. Limit salt. -recommend moderate walking, 3-5 times/week for 30-50 minutes each session. Aim for at least 150 minutes.week. Goal should be pace of 3 miles/hours, or walking 1.5 miles in 30 minutes -recommend avoidance of tobacco products. Avoid excess alcohol. -Additional risk factor control:             -Diabetes: A1c is not available, no history             -Blood pressure control: at goal, on no meds             -Weight: BMI 21, normal range -ASCVD risk score: The ASCVD Risk score Mikey Bussing DC Jr., et al., 2013) failed to calculate for the following reasons:   The valid total cholesterol range is 130 to 320 mg/dL    Plan for follow up: 2 years or sooner PRN  Medication Adjustments/Labs and Tests Ordered: Current medicines are reviewed at length with the patient today.  Concerns regarding medicines are outlined above.  Orders Placed This Encounter  Procedures   Lipid panel   EKG 12-Lead   No orders of the defined types were placed in this encounter.   Patient Instructions  Medication Instructions:  Your Physician recommend you continue on your current medication as  directed.    *If you need a refill on your cardiac medications before your next appointment, please call your pharmacy*   Lab Work: Your physician recommends that you return for lab work in 1 month ( fasting lipid)  If you have labs (blood work) drawn today and your tests are completely normal, you will receive your results only by:  [place] (if you have  MyChart) OR  A paper copy in the mail If you have any lab test that is abnormal or we need to change your treatment, we will call you to review the results.   Testing/Procedures: None ordered    Follow-Up: At [facility], you and your health needs are our priority.  As part of our continuing mission to provide you with exceptional heart care, we have created designated Provider Care Teams.  These Care Teams include your primary Cardiologist (physician) and Advanced Practice Providers (APPs -  Physician Assistants and Nurse Practitioners) who all work together to provide you with the care you need, when you need it.  We recommend signing up for the patient portal called "MyChart".  Sign up information is provided on this After Visit Summary.  MyChart is used to connect with patients for Virtual Visits (Telemedicine).  Patients are able to view lab/test results, encounter notes, upcoming appointments, etc.  Non-urgent messages can be sent to your provider as well.   To learn more about what you can do with MyChart, go to [email or web address].    Your next appointment:   2 year(s)  The format for your next appointment:   In Person  Provider:   Buford Dresser, MD      Signed, Buford Dresser, MD PhD 06/12/2020 8:41 AM    [place]

## 2020-06-12 NOTE — Patient Instructions (Signed)
Medication Instructions:  Your Physician recommend you continue on your current medication as directed.    *If you need a refill on your cardiac medications before your next appointment, please call your pharmacy*   Lab Work: Your physician recommends that you return for lab work in 1 month ( fasting lipid)  If you have labs (blood work) drawn today and your tests are completely normal, you will receive your results only by: Marland Kitchen MyChart Message (if you have MyChart) OR . A paper copy in the mail If you have any lab test that is abnormal or we need to change your treatment, we will call you to review the results.   Testing/Procedures: None ordered    Follow-Up: At [facility], you and your health needs are our priority.  As part of our continuing mission to provide you with exceptional heart care, we have created designated Provider Care Teams.  These Care Teams include your primary Cardiologist (physician) and Advanced Practice Providers (APPs -  Physician Assistants and Nurse Practitioners) who all work together to provide you with the care you need, when you need it.  We recommend signing up for the patient portal called "MyChart".  Sign up information is provided on this After Visit Summary.  MyChart is used to connect with patients for Virtual Visits (Telemedicine).  Patients are able to view lab/test results, encounter notes, upcoming appointments, etc.  Non-urgent messages can be sent to your provider as well.   To learn more about what you can do with MyChart, go to [email or web address].    Your next appointment:   2 year(s)  The format for your next appointment:   In Person  Provider:   Buford Dresser, MD

## 2020-06-25 ENCOUNTER — Encounter: Payer: Self-pay | Admitting: Obstetrics and Gynecology

## 2020-06-25 ENCOUNTER — Other Ambulatory Visit: Payer: Self-pay

## 2020-06-25 ENCOUNTER — Ambulatory Visit (INDEPENDENT_AMBULATORY_CARE_PROVIDER_SITE_OTHER): Payer: Medicare Other | Admitting: Obstetrics and Gynecology

## 2020-06-25 VITALS — BP 124/74

## 2020-06-25 DIAGNOSIS — D259 Leiomyoma of uterus, unspecified: Secondary | ICD-10-CM

## 2020-06-25 DIAGNOSIS — N871 Moderate cervical dysplasia: Secondary | ICD-10-CM

## 2020-06-25 DIAGNOSIS — Z90710 Acquired absence of both cervix and uterus: Secondary | ICD-10-CM

## 2020-06-25 NOTE — Progress Notes (Signed)
Christina Edwards [birth year] [id number]   REASON FOR APPOINTMENT Chief Complaint  Patient presents with  . Post op check     HISTORY OF PRESENT ILLNESS Christina Edwards  presents for routine 6 week post-operative follow up for a total laparoscopic hysterectomy with bilateral salpingo-oophorectomy with lysis of adhesions and cystoscopy performed on 05/15/2020 for persistent abnormal Pap smear with inadequate colposcopy exams. Her final pathology report indicated CIN-2. The patient is doing well with no concerns. Very minor soreness in her lower abdomen but not requiring any pain medications, she also admits to having some chronic bowel issue related abdominal discomforts predated surgery.  She denies vaginal bleeding or discharge. She is tolerating a normal diet.  Bowel and bladder function are normal and she has no pain or discomfort with passage of a bowel movement or urine. Describes an episode about 3 weeks after the surgery where she had driven for about 45 to 50 minutes to her new property and shortly thereafter felt very lightheaded and almost passed out and then had a bit of a headache afterwards. She thought that she was dehydrated and has committed to drinking more water regularly and has not had a repeat episode of this since. Also indicates that she has had very mild increase in hot flash sensations.    OBJECTIVE  BP 124/74   LMP 09/22/2000    PHYSICAL EXAM  General:  Patient is no acute distress she is alert and oriented Abdomen:  Soft, nontender, nondistended.  No mass, rebound, nor guarding is appreciated.  Incisions are clean dry and intact x3 without drainage nor erythema. Pelvic:  External genitalia appear to be within normal limits consistent with postmenopausal status.  Bartholin's and Skene glands and urethra appear to be within normal limits.  There are no lesions on the labia or vaginal tissues.  Vaginal epithelium is moist, pink, with expected loss of rugation.   There is no vaginal bleeding or abnormal discharge. Vaginal cuff appears intact without granulation tissue. A single vaginal cuff stitch fragment is remaining in the midline, this is cut with scissors today.  Bimanual exam indicates that the vaginal cuff is intact and without tenderness.  There is no adnexal mass, fullness, nor tenderness.  [name] present for exam  ASSESSMENT / PLAN  Routine 6 week post operative check.   The patient is doing well and meeting all postoperative milestones.  I reviewed the pathology report from hysterectomy specimen and I would recommend vaginal Pap smears at the routine interval (every 3 years if normal cytology) at her annual exams given the cervical dysplasia.  I showed her some pictures taken during the laparoscopy.  She may gradually return to normal activities without restriction and resume normal gynecologic care at this point.  I did tell her to refrain from vaginal intercourse for at least 2 more weeks and then resume carefully after that. The meaning stitch fragment at the vaginal cuff will dissolve on its own.  We discussed the slight uptick in hot flashes may be due to the oophorectomy and the very low level of residual hormone activity from the ovaries and she will let us know if the vasomotor symptoms become unbearable at all, at the current time she is doing fine. I expect her abdominal discomfort may be partially due to inflammation from healing and will continue to get better over the next month or two, but if it does not she should seek follow-up care.   Joseph Pierini MD 06/25/20

## 2020-07-16 LAB — LIPID PANEL
Chol/HDL Ratio: 2.4 ratio (ref 0.0–4.4)
Cholesterol, Total: 134 mg/dL (ref 100–199)
HDL: 57 mg/dL (ref >39)
LDL Chol Calc (NIH): 58 mg/dL (ref 0–99)
Triglycerides: 106 mg/dL (ref 0–149)
VLDL Cholesterol Cal: 19 mg/dL (ref 5–40)

## 2021-01-24 ENCOUNTER — Telehealth: Payer: Self-pay | Admitting: Cardiology

## 2021-01-24 NOTE — Telephone Encounter (Signed)
Reports intermittent pressure and burning in center of chest rated 7/10 that started greater than one week ago. Denies SOB or dizziness. First available appointment given to see Eulas Post 02/01/21 @3 :15 pm. Advised if symptoms got worse, to go to the ED for an evaluation. Verbalized understanding of plan.

## 2021-01-24 NOTE — Telephone Encounter (Signed)
Pt c/o of Chest Pain: STAT if CP now or developed within 24 hours  1. Are you having CP right now? NO but little burning  2. Are you experiencing any other symptoms (ex. SOB, nausea, vomiting, sweating)? NO  3. How long have you been experiencing CP? Over a week   4. Is your CP continuous or coming and going? Coming and going   5. Have you taken Nitroglycerin? NO  ?  PT states that when she is moving around it gets worse.She also states she has had allergies for about two weeks.

## 2021-02-01 ENCOUNTER — Ambulatory Visit: Payer: Medicare Other | Admitting: Physician Assistant

## 2021-02-15 ENCOUNTER — Other Ambulatory Visit: Payer: Self-pay | Admitting: Family Medicine

## 2021-02-15 DIAGNOSIS — Z1231 Encounter for screening mammogram for malignant neoplasm of breast: Secondary | ICD-10-CM

## 2021-02-21 ENCOUNTER — Ambulatory Visit: Payer: Medicare Other | Admitting: Physician Assistant

## 2021-02-21 ENCOUNTER — Ambulatory Visit: Payer: Medicare Other | Admitting: General Practice

## 2021-02-26 ENCOUNTER — Other Ambulatory Visit: Payer: Self-pay

## 2021-02-26 ENCOUNTER — Ambulatory Visit
Admission: RE | Admit: 2021-02-26 | Discharge: 2021-02-26 | Disposition: A | Discharge status: Home / Self Care | Payer: Medicare Other | Source: Ambulatory Visit | Attending: Family Medicine | Admitting: Family Medicine

## 2021-02-26 DIAGNOSIS — Z1231 Encounter for screening mammogram for malignant neoplasm of breast: Secondary | ICD-10-CM

## 2021-05-31 ENCOUNTER — Other Ambulatory Visit: Payer: Self-pay | Admitting: Cardiology

## 2021-06-24 ENCOUNTER — Ambulatory Visit: Payer: Medicare Other | Admitting: Obstetrics and Gynecology

## 2021-07-05 ENCOUNTER — Ambulatory Visit (INDEPENDENT_AMBULATORY_CARE_PROVIDER_SITE_OTHER): Payer: Medicare Other | Admitting: Obstetrics & Gynecology

## 2021-07-05 ENCOUNTER — Other Ambulatory Visit: Payer: Self-pay

## 2021-07-05 ENCOUNTER — Encounter: Payer: Self-pay | Admitting: Obstetrics & Gynecology

## 2021-07-05 ENCOUNTER — Other Ambulatory Visit (HOSPITAL_COMMUNITY)
Admission: RE | Admit: 2021-07-05 | Discharge: 2021-07-05 | Disposition: A | Discharge status: Home / Self Care | Payer: Medicare Other | Source: Ambulatory Visit | Attending: Obstetrics and Gynecology | Admitting: Obstetrics and Gynecology

## 2021-07-05 VITALS — BP 110/60 | HR 60 | Resp 16 | Ht 68.5 in | Wt 147.0 lb

## 2021-07-05 DIAGNOSIS — Z01419 Encounter for gynecological examination (general) (routine) without abnormal findings: Secondary | ICD-10-CM | POA: Insufficient documentation

## 2021-07-05 DIAGNOSIS — N871 Moderate cervical dysplasia: Secondary | ICD-10-CM

## 2021-07-05 DIAGNOSIS — Z1272 Encounter for screening for malignant neoplasm of vagina: Secondary | ICD-10-CM | POA: Diagnosis present

## 2021-07-05 DIAGNOSIS — M8589 Other specified disorders of bone density and structure, multiple sites: Secondary | ICD-10-CM

## 2021-07-05 DIAGNOSIS — Z90710 Acquired absence of both cervix and uterus: Secondary | ICD-10-CM

## 2021-07-05 DIAGNOSIS — Z853 Personal history of malignant neoplasm of breast: Secondary | ICD-10-CM

## 2021-07-05 DIAGNOSIS — Z23 Encounter for immunization: Secondary | ICD-10-CM | POA: Diagnosis not present

## 2021-07-05 DIAGNOSIS — Z780 Asymptomatic menopausal state: Secondary | ICD-10-CM

## 2021-07-05 NOTE — Progress Notes (Signed)
Christina Edwards Amma Crear 09/26/1945 [id number]   History:    75 y.o. G1P1L1 Married  RP:  Established patient presenting for annual gyn exam   HPI: S/P LAVH/BSO.  Patho: CIN 2.  Postmenopausal, well on no HRT.  Rare hot flushes. History of left breast cancer.  Mammogram Neg 02/2021. Colonoscopy 2019.  Osteopenia. DEXA 11/2018 was stable. Health labs with Fam MD. BMI 22.03.  Physically active.  Past medical history,surgical history, family history and social history were all reviewed and documented in the EPIC chart.  Gynecologic History Patient's last menstrual period was 09/22/2000.  Obstetric History OB History  Gravida Para Term Preterm AB Living  1 1 1     1   SAB IAB Ectopic Multiple Live Births               # Outcome Date GA Lbr Len/2nd Weight Sex Delivery Anes PTL Lv  1 Term              ROS: A ROS was performed and pertinent positives and negatives are included in the history.  GENERAL: No fevers or chills. HEENT: No change in vision, no earache, sore throat or sinus congestion. NECK: No pain or stiffness. CARDIOVASCULAR: No chest pain or pressure. No palpitations. PULMONARY: No shortness of breath, cough or wheeze. GASTROINTESTINAL: No abdominal pain, nausea, vomiting or diarrhea, melena or bright red blood per rectum. GENITOURINARY: No urinary frequency, urgency, hesitancy or dysuria. MUSCULOSKELETAL: No joint or muscle pain, no back pain, no recent trauma. DERMATOLOGIC: No rash, no itching, no lesions. ENDOCRINE: No polyuria, polydipsia, no heat or cold intolerance. No recent change in weight. HEMATOLOGICAL: No anemia or easy bruising or bleeding. NEUROLOGIC: No headache, seizures, numbness, tingling or weakness. PSYCHIATRIC: No depression, no loss of interest in normal activity or change in sleep pattern.     Exam:   BP 110/60   Pulse 60   Resp 16   Ht 5' 8.5" (1.74 m)   Wt 147 lb (66.7 kg)   LMP 09/22/2000   BMI 22.03 kg/m   Body mass index is 22.03  kg/m.  General appearance : Well developed well nourished female. No acute distress HEENT: Eyes: no retinal hemorrhage or exudates,  Neck supple, trachea midline, no carotid bruits, no thyroidmegaly Lungs: Clear to auscultation, no rhonchi or wheezes, or rib retractions  Heart: Regular rate and rhythm, no murmurs or gallops Breast:Examined in sitting and supine position were symmetrical in appearance, no palpable masses or tenderness,  no skin retraction, no nipple inversion, no nipple discharge, no skin discoloration, no axillary or supraclavicular lymphadenopathy Abdomen: no palpable masses or tenderness, no rebound or guarding Extremities: no edema or skin discoloration or tenderness  Pelvic: Vulva: Normal             Vagina: No gross lesions or discharge.  Pap reflex done.  Cervix/Uterus absent  Adnexa  Without masses or tenderness  Anus: Normal   Assessment/Plan:  75 y.o. female for annual exa  1. Encounter for Papanicolaou smear of vagina as part of routine gynecological examination Gynecologic exam status post laparoscopic hysterectomy and status post BSO.  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  Pap reflex done at the vaginal vault.  Breast exam normal.  Screening mammogram June 2022 was negative.  Colonoscopy 2019.  Good body mass index at 22.03.  Health labs with family physician. - Cytology - PAP( [place])  2. S/P laparoscopic hysterectomy/BSO  3. High grade squamous intraepithelial lesion (HGSIL), grade 2  CIN, on biopsy of cervix - Cytology - PAP( [place])  4. Postmenopause Well on no hormone replacement therapy.   5. Osteopenia of multiple sites Osteopenia on bone density March 2020.  We will repeat bone density now.  Vitamin D supplements, calcium intake of 1.5 g/day and regular weightbearing physical activities. - DG Bone Density; Future  6. History of breast cancer Left breast cancer.  7. Needs flu shot Flu shot given.  Other orders -  Calcium Citrate-Vitamin D (CALCIUM + D PO); Take by mouth in the morning and at bedtime. - diphenhydrAMINE HCl (BENADRYL PO); Take by mouth as needed. - IBUPROFEN PO; Take by mouth. - Alum & Mag Hydroxide-Simeth (MYLANTA PO); Take by mouth as needed. - FAMOTIDINE PO; Take by mouth as needed. - Calcium Carbonate Antacid (TUMS PO); Take by mouth as needed.   [name] MD, 8:50 AM 07/05/2021

## 2021-07-09 LAB — CYTOLOGY - PAP

## 2021-07-17 ENCOUNTER — Ambulatory Visit (INDEPENDENT_AMBULATORY_CARE_PROVIDER_SITE_OTHER): Payer: Medicare Other

## 2021-07-17 ENCOUNTER — Other Ambulatory Visit: Payer: Self-pay | Admitting: Obstetrics & Gynecology

## 2021-07-17 ENCOUNTER — Other Ambulatory Visit: Payer: Self-pay

## 2021-07-17 DIAGNOSIS — Z780 Asymptomatic menopausal state: Secondary | ICD-10-CM | POA: Diagnosis not present

## 2021-07-17 DIAGNOSIS — M8589 Other specified disorders of bone density and structure, multiple sites: Secondary | ICD-10-CM | POA: Diagnosis not present

## 2021-07-24 ENCOUNTER — Other Ambulatory Visit: Payer: Self-pay

## 2021-07-24 DIAGNOSIS — R87612 Low grade squamous intraepithelial lesion on cytologic smear of cervix (LGSIL): Secondary | ICD-10-CM

## 2021-08-19 ENCOUNTER — Other Ambulatory Visit: Payer: Self-pay

## 2021-08-19 ENCOUNTER — Ambulatory Visit (INDEPENDENT_AMBULATORY_CARE_PROVIDER_SITE_OTHER): Payer: Medicare Other | Admitting: Obstetrics & Gynecology

## 2021-08-19 ENCOUNTER — Other Ambulatory Visit (HOSPITAL_COMMUNITY)
Admission: RE | Admit: 2021-08-19 | Discharge: 2021-08-19 | Disposition: A | Discharge status: Home / Self Care | Payer: Medicare Other | Source: Ambulatory Visit | Attending: Obstetrics & Gynecology | Admitting: Obstetrics & Gynecology

## 2021-08-19 ENCOUNTER — Encounter: Payer: Self-pay | Admitting: Obstetrics & Gynecology

## 2021-08-19 VITALS — BP 136/60 | HR 62

## 2021-08-19 DIAGNOSIS — R87622 Low grade squamous intraepithelial lesion on cytologic smear of vagina (LGSIL): Secondary | ICD-10-CM | POA: Diagnosis present

## 2021-08-19 DIAGNOSIS — N871 Moderate cervical dysplasia: Secondary | ICD-10-CM

## 2021-08-19 DIAGNOSIS — Z90710 Acquired absence of both cervix and uterus: Secondary | ICD-10-CM

## 2021-08-19 NOTE — Progress Notes (Signed)
    Christina Edwards [birth year] [id number]        75 y.o.  G1P1L1   RP: LGSIL/some cells suspicious for HGSIL of the Vagina for Colposcopy  HPI: LGSIL/some cells suspicious for HGSIL of the Vagina on Pap 07/05/2021.  H/O LAVH/BSO 04/2020, patho of Cervix CIN 2.  H/O LEEP 2013.   OB History  Gravida Para Term Preterm AB Living  1 1 1     1   SAB IAB Ectopic Multiple Live Births               # Outcome Date GA Lbr Len/2nd Weight Sex Delivery Anes PTL Lv  1 Term             Past medical history,surgical history, problem list, medications, allergies, family history and social history were all reviewed and documented in the EPIC chart.   Directed ROS with pertinent positives and negatives documented in the history of present illness/assessment and plan.  Exam:  Vitals:   08/19/21 1019  BP: 136/60  Pulse: 62  SpO2: 100%   General appearance:  Normal  Colposcopy Procedure Note Christina Edwards 08/19/2021  Indications: LGSIL/some cells suspicious for HGSIL of the vagina  Procedure Details  The risks and benefits of the procedure and Written informed consent obtained.  Speculum placed in vagina and excellent visualization of vagina achieved, vagina swabbed x 3 with acetic acid solution.  Findings:  Cervix colposcopy: Cervix absent  Vaginal colposcopy:  AW with punctation at the Right Vaginal vault.  Hurricane spray.  Bx taken.  Silver Nitrate for hemostasis.  Vulvar colposcopy: Normal  Perirectal colposcopy: Normal  The cervix was sprayed with Hurricane before performing the cervical biopsies.  Specimens: HPV 16-18-45.  Right Vaginal vault biopsy.  Complications:  None, good hemostasis with Silver Nitrate. . Plan:  Management per results.   Assessment/Plan:  75 y.o. G1P1001   1. LGSIL Pap smear of vagina with some cells suspicious for HGSIL LGSIL/some cells suspicious for HGSIL of the Vagina on Pap 07/05/2021.  H/O LAVH/BSO 04/2020, patho of Cervix  CIN 2.  H/O LEEP 2013. Counseling on abnormal Pap/H/O HR HPV pos done.  Colposcopy procedure discussed.  Colposcopy findings reviewed.  HPV 16-18-45 done.  Vaginal Bx at the Rt Vaginal vault pending.  Post procedure precautions reviewed.  Management per results. - Colposcopy - Cervicovaginal ancillary only( [place]) - Surgical pathology( [place]/ [facility])  2. S/P laparoscopic hysterectomy/BSO  3. 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  Had LEEP in 2013.  Other orders - rosuvastatin (CRESTOR) 5 MG tablet; 1 tablet   [name] MD, 10:49 AM 08/19/2021

## 2021-08-20 LAB — SURGICAL PATHOLOGY

## 2021-08-22 LAB — CERVICOVAGINAL ANCILLARY ONLY
Comment: NEGATIVE
HPV 16: NEGATIVE
HPV 18 / 45: NEGATIVE

## 2021-10-17 ENCOUNTER — Telehealth: Payer: Self-pay | Admitting: Cardiology

## 2021-10-17 MED ORDER — ROSUVASTATIN CALCIUM 5 MG PO TABS: ORAL_TABLET | ORAL | 0 refills | Status: DC

## 2021-10-17 NOTE — Telephone Encounter (Signed)
°*  STAT* If patient is at the pharmacy, call can be transferred to refill team.   1. Which medications need to be refilled? (please list name of each medication and dose if known) rosuvastatin (CRESTOR) 5 MG tablet   2. Which pharmacy/location (including street and city if local pharmacy) is medication to be sent to?[place] ([facility] ) - [place], [place]  Phone: [phone] Fax:  [phone]   3. Do they need a 30 day or 90 day supply? 30 ds

## 2021-10-18 ENCOUNTER — Other Ambulatory Visit: Payer: Self-pay

## 2021-10-18 ENCOUNTER — Telehealth (HOSPITAL_BASED_OUTPATIENT_CLINIC_OR_DEPARTMENT_OTHER): Payer: Self-pay | Admitting: Cardiology

## 2021-10-18 MED ORDER — ROSUVASTATIN CALCIUM 5 MG PO TABS: ORAL_TABLET | ORAL | 3 refills | Status: DC

## 2021-10-18 NOTE — Telephone Encounter (Signed)
Pt c/o medication issue:  1. Name of Medication: rosuvastatin (CRESTOR) 5 MG tablet  2. How are you currently taking this medication (dosage and times per day)? As directed   3. Are you having a reaction (difficulty breathing--STAT)? no  4. What is your medication issue? Patient wanted to get a 90 day supply from the mail order pharmacy instead of a 30 day supply.  She is not due to see Dr. Harrell Gave until 05/2022. She would like a 90 day supply from her mail order to save money

## 2021-10-18 NOTE — Telephone Encounter (Signed)
Patient is not due to see Dr. Harrell Gave until 05/2022. Patient has a two year recall in her chart

## 2021-10-18 NOTE — Telephone Encounter (Signed)
Patient doesn't have an appt on file for 05/2022 but refills for Rosuvastatin (Crestor) 5 MG 90 days supply and 3 refills sent to [facility].

## 2021-10-18 NOTE — Telephone Encounter (Signed)
See previous encounter from 1/26 prescription already sent

## 2021-10-25 ENCOUNTER — Telehealth (HOSPITAL_BASED_OUTPATIENT_CLINIC_OR_DEPARTMENT_OTHER): Payer: Self-pay | Admitting: Cardiology

## 2021-10-25 MED ORDER — ROSUVASTATIN CALCIUM 5 MG PO TABS: 5.0000 mg | ORAL_TABLET | Freq: Every day | ORAL | 3 refills | Status: DC

## 2021-10-25 NOTE — Telephone Encounter (Signed)
Received fax from [facility] requesting frequency of administration for Rosuvastatin 5 mg as this was not listed on recent Rx.  rosuvastatin (CRESTOR) 5 MG tablet  3 ordered  EditCancel Reorder      Summary: 1 tablet, Normal  Start: 10/18/2021 Ord/Sold: 10/18/2021 (O) Report Adh:  Taking:  Long-term:  Pharmacy: [facility] ([facility] ) - [place], [place] Dose History     Patient Sig: 1 tablet     Ordered on: 10/18/2021     Authorized by: Buford Dresser     Dispense: 90 tablet     Admin Instructions: 1 tablet     Note to Pharmacy: [place] AN OFFICE VISIT WITH DR [place]. 2023      Sent correct Rx to pharmacy. Rosuvastatin 5 mg 1 tablet by mouth daily.

## 2021-11-15 NOTE — Telephone Encounter (Signed)
Message for 10/18/2021 was in error sent in 30 day supply zero refills for Rosuvastatin (Crestor) 5 MG to [facility].

## 2021-12-12 ENCOUNTER — Other Ambulatory Visit: Payer: Self-pay

## 2021-12-12 ENCOUNTER — Ambulatory Visit (INDEPENDENT_AMBULATORY_CARE_PROVIDER_SITE_OTHER): Payer: Medicare Other | Admitting: Podiatry

## 2021-12-12 DIAGNOSIS — Q828 Other specified congenital malformations of skin: Secondary | ICD-10-CM

## 2021-12-12 DIAGNOSIS — M79672 Pain in left foot: Secondary | ICD-10-CM

## 2021-12-14 NOTE — Progress Notes (Signed)
Subjective:  ? ?Patient ID: Christina Edwards, female   DOB: 76 y.o.   MRN: [id number]  ? ?HPI ?76 year old female presents the office today for concerns of a painful spot on the bottom of her left foot which started about 3 months ago most with walking.  She has tried over-the-counter corn pads.  She states that she has had this issue previously on the right foot she saw Dr. Prudence Davidson for this and they were trimmed.  She denies any open sores or swelling or redness or drainage.  No injuries.  No other concerns. ? ? ?Review of Systems  ?All other systems reviewed and are negative. ? ?Past Medical History:  ?Diagnosis Date  ? Anemia   ? Basal cell carcinoma   ? Breast cancer ([place]) 1987  ? Left breast intraductal  ? Diverticulitis   ? Endometriosis   ? GERD (gastroesophageal reflux disease)   ? 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/2019  ? LGSIL on Pap smear of cervix   ? 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   ? Nocturnal leg cramps   ? Osteopenia 11/2018  ? T score -1.9 asked 9% / 1.4%  ? Personal history of radiation therapy   ? PONV (postoperative nausea and vomiting)   ? ? ?Past Surgical History:  ?Procedure Laterality Date  ? APPENDECTOMY  1954  ? BREAST LUMPECTOMY Left 1987  ? BREAST SURGERY  1987  ? Lumpectomy  ? CERVICAL BIOPSY  W/ LOOP ELECTRODE EXCISION    ? COLONOSCOPY  02/2020  ? CYSTOSCOPY N/A 05/15/2020  ? Procedure: CYSTOSCOPY;  Surgeon: Joseph Pierini, MD;  Location: [facility] [address];  Service: Gynecology;  Laterality: N/A;  ? EYE SURGERY    ? laser to fix torn retina  ? HAND SURGERY    ? left hand  ? LAPAROSCOPIC HYSTERECTOMY N/A 05/15/2020  ? Procedure: HYSTERECTOMY TOTAL LAPAROSCOPIC;  Surgeon: Joseph Pierini, MD;  Location: [facility];  Service: Gynecology;  Laterality: N/A;  request 7:30 am OR time in [place] block ?requests two hours  ? LAPAROSCOPIC SALPINGO OOPHERECTOMY Right 05/15/2020  ? Procedure: LAPAROSCOPIC  SALPINGO OOPHORECTOMY;  Surgeon: Joseph Pierini, MD;  Location: [facility];  Service: Gynecology;  Laterality: Right;  ? OOPHORECTOMY  1989  ? LSO  ? TONSILLECTOMY  1955  ? TUBAL LIGATION  1984  ? ? ? ?Current Outpatient Medications:  ?  Alum & Mag Hydroxide-Simeth (MYLANTA PO), Take by mouth as needed., Disp: , Rfl:  ?  aspirin EC 81 MG tablet, Take 1 tablet (81 mg total) by mouth in the morning. Swallow whole.  Restart 1 week after surgery, Disp: 30 tablet, Rfl: 11 ?  Calcium Carbonate Antacid (TUMS PO), Take by mouth as needed., Disp: , Rfl:  ?  Calcium Citrate-Vitamin D (CALCIUM + D PO), Take by mouth in the morning and at bedtime., Disp: , Rfl:  ?  diphenhydrAMINE HCl (BENADRYL PO), Take by mouth as needed., Disp: , Rfl:  ?  FAMOTIDINE PO, Take by mouth as needed., Disp: , Rfl:  ?  fluticasone (FLONASE) 50 MCG/ACT nasal spray, Place 2 sprays into both nostrils as needed., Disp: , Rfl:  ?  IBUPROFEN PO, Take by mouth., Disp: , Rfl:  ?  rosuvastatin (CRESTOR) 5 MG tablet, Take 1 tablet (5 mg total) by mouth daily., Disp: 90 tablet, Rfl: 3 ? ?Allergies  ?Allergen Reactions  ? Sulfa Antibiotics Other (See Comments)  ?  Reaction unknown  ? Adhesive [Tape] Rash  ? Latex Rash  ? Neosporin [Neomycin-Bacitracin Zn-Polymyx] Rash  ? ? ? ? ? ?   ?Objective:  ?Physical Exam  ?General: AAO x3, NAD ? ?Dermatological: On the plantar aspect left foot submetatarsal 2 is 2 small punctate annular hyperkeratotic lesions.  Upon debridement no underlying ulceration or signs of infection.  Small mount of bleeding did occur but no laceration was noted.  Diffuse hyperkeratotic lesion noted submetatarsal 5 on the left foot without any underlying ulceration drainage or signs of infection.  Also has similar issues on the right foot but not has any pain and minimal callus formation. ? ?Vascular: Dorsalis Pedis artery and Posterior Tibial artery pedal pulses are 2/4 bilateral with immedate capillary fill time. There is no  pain with calf compression, swelling, warmth, erythema.  ? ?Neruologic: Grossly intact via light touch bilateral.  ? ?Musculoskeletal: Mild prominence of metatarsal heads plantarly.  Muscular strength 5/5 in all groups tested bilateral. ? ?Gait: Unassisted, Nonantalgic.  ? ? ?   ?Assessment:  ? ?Porokeratosis ? ?   ?Plan:  ?-Treatment options discussed including all alternatives, risks, and complications ?-Etiology of symptoms were discussed ?-Sharply debrided the skin lesions today.  Minimal amount of bleeding occurred when the lesions submetatarsal 2 of the left foot.  I cleaned the area with alcohol and Medihoney was applied followed by dressing.  Continue with antibiotic ointment dressing changes daily and monitor for any signs or symptoms of infection anteromedial immediately should any issues occur. ?-Recommend moisturizer and offloading.  Discussed supportive shoe gear as well. ? ?Trula Slade DPM ? ?   ? ?

## 2022-01-29 ENCOUNTER — Other Ambulatory Visit: Payer: Self-pay | Admitting: Family Medicine

## 2022-01-29 DIAGNOSIS — Z1231 Encounter for screening mammogram for malignant neoplasm of breast: Secondary | ICD-10-CM

## 2022-02-26 ENCOUNTER — Ambulatory Visit: Payer: Medicare Other | Admitting: Obstetrics & Gynecology

## 2022-02-27 ENCOUNTER — Other Ambulatory Visit (HOSPITAL_COMMUNITY)
Admission: RE | Admit: 2022-02-27 | Discharge: 2022-02-27 | Disposition: A | Discharge status: Home / Self Care | Payer: Medicare Other | Source: Ambulatory Visit | Attending: Obstetrics & Gynecology | Admitting: Obstetrics & Gynecology

## 2022-02-27 ENCOUNTER — Ambulatory Visit
Admission: RE | Admit: 2022-02-27 | Discharge: 2022-02-27 | Disposition: A | Discharge status: Home / Self Care | Payer: Medicare Other | Source: Ambulatory Visit | Attending: Family Medicine | Admitting: Family Medicine

## 2022-02-27 ENCOUNTER — Ambulatory Visit (INDEPENDENT_AMBULATORY_CARE_PROVIDER_SITE_OTHER): Payer: Medicare Other | Admitting: Obstetrics & Gynecology

## 2022-02-27 ENCOUNTER — Encounter: Payer: Self-pay | Admitting: Obstetrics & Gynecology

## 2022-02-27 VITALS — BP 104/64 | Wt 153.0 lb

## 2022-02-27 DIAGNOSIS — Z90710 Acquired absence of both cervix and uterus: Secondary | ICD-10-CM | POA: Diagnosis not present

## 2022-02-27 DIAGNOSIS — N890 Mild vaginal dysplasia: Secondary | ICD-10-CM | POA: Diagnosis not present

## 2022-02-27 DIAGNOSIS — Z1231 Encounter for screening mammogram for malignant neoplasm of breast: Secondary | ICD-10-CM

## 2022-02-27 DIAGNOSIS — N898 Other specified noninflammatory disorders of vagina: Secondary | ICD-10-CM | POA: Diagnosis present

## 2022-02-27 DIAGNOSIS — N871 Moderate cervical dysplasia: Secondary | ICD-10-CM | POA: Diagnosis not present

## 2022-02-27 NOTE — Progress Notes (Signed)
    Christina Edwards November 26, 1945 [id number]        76 y.o.  G1P1001   RP: VAIN 1 for 6 month Pap  HPI: Colpo 08/19/2021 VAIN 1.  HPV 16-18-45 Neg.  H/O Severe Dysplasia of the Cervix for which patient had an LAVH/BSO.     OB History  Gravida Para Term Preterm AB Living  '1 1 1     1  '$ SAB IAB Ectopic Multiple Live Births               # Outcome Date GA Lbr Len/2nd Weight Sex Delivery Anes PTL Lv  1 Term             Past medical history,surgical history, problem list, medications, allergies, family history and social history were all reviewed and documented in the EPIC chart.   Directed ROS with pertinent positives and negatives documented in the history of present illness/assessment and plan.  Exam:  Vitals:   02/27/22 0947  BP: 104/64  Weight: 153 lb (69.4 kg)   General appearance:  Normal  Gynecologic exam: Vulva normal.  Speculum:  Vagina normal.  Pap reflex at the vaginal vault.   Assessment/Plan:  76 y.o. G1P1001   1. 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  HPV 16-18-45 Neg.  H/O Severe Dysplasia of the Cervix for which patient had an LAVH/BSO.  Pap reflex at the Vaginal vault done.  Management per results. - Cytology - PAP( [place])  2. S/P laparoscopic hysterectomy/BSO  3. H/O High grade squamous intraepithelial lesion (HGSIL), grade 2 CIN, on biopsy of cervix   Christina Bruins MD, 10:16 AM 02/27/2022

## 2022-03-03 LAB — CYTOLOGY - PAP

## 2022-05-30 ENCOUNTER — Ambulatory Visit (INDEPENDENT_AMBULATORY_CARE_PROVIDER_SITE_OTHER): Payer: Medicare Other | Admitting: Podiatry

## 2022-05-30 DIAGNOSIS — D492 Neoplasm of unspecified behavior of bone, soft tissue, and skin: Secondary | ICD-10-CM | POA: Diagnosis not present

## 2022-05-30 DIAGNOSIS — D2371 Other benign neoplasm of skin of right lower limb, including hip: Secondary | ICD-10-CM

## 2022-05-30 NOTE — Progress Notes (Unsigned)
Subjective: Chief Complaint  Patient presents with   Callouses    Left foot callus, pain when walking, rate of pain 7 out of 78    76 year old female presents with above concerns.  She states that she has pain pointing submetatarsal 2 on the left foot where she gets discomfort may be a callus.  No recent injuries.  Was feeling better at the last appointment but the symptoms have returned.  No recent injuries.  Denies stepping any foreign objects.  No other concerns.  Objective: AAO x3, NAD DP/PT pulses palpable bilaterally, CRT less than 3 seconds On the left foot submetatarsal 2 is a annular hyperkeratotic lesion.  There is no pinpoint bleeding evidence of verruca.  No edema, erythema or signs of infection.  No evidence of foreign body.  No pain with calf compression, swelling, warmth, erythema  Assessment: Skin lesion left foot  Plan: -All treatment options discussed with the patient including all alternatives, risks, complications.  -Sharply debrided the lesions with any complications or bleeding.  This cleaned with alcohol pad was placed followed by salicylic acid and a bandage.  Postprocedure instructions discussed.  Monitor for any signs or symptoms of infection. -Patient encouraged to call the office with any questions, concerns, change in symptoms.   Trula Slade DPM

## 2022-05-30 NOTE — Patient Instructions (Signed)
Keep the bandage on for 24 hours. At that time, remove and clean with soap and water. If it hurts or burns before 24 hours go ahead and remove the bandage and wash with soap and water. Keep the area clean. If there is any blistering cover with antibiotic ointment and a bandage. Monitor for any redness, drainage, or other signs of infection. Call the office if any are to occur. If you have any questions, please call the office at [phone].  

## 2022-06-02 ENCOUNTER — Telehealth: Payer: Self-pay | Admitting: Podiatry

## 2022-06-02 NOTE — Telephone Encounter (Signed)
Transferred patient to nurse , she had questions about white medication that was put on her foot, when callous was removed.    Transfer to [employer]

## 2022-06-02 NOTE — Telephone Encounter (Signed)
Spoke to the patient and she was given the information as to what to put on her foot for her callous. Patient verbalized understanding of the information given.

## 2022-07-08 ENCOUNTER — Ambulatory Visit: Payer: Medicare Other | Admitting: Obstetrics & Gynecology

## 2022-08-29 ENCOUNTER — Ambulatory Visit: Payer: Medicare Other | Admitting: Obstetrics & Gynecology

## 2022-09-24 ENCOUNTER — Ambulatory Visit: Payer: Medicare Other | Admitting: Obstetrics & Gynecology

## 2022-10-03 ENCOUNTER — Ambulatory Visit (INDEPENDENT_AMBULATORY_CARE_PROVIDER_SITE_OTHER): Payer: Medicare Other | Admitting: Cardiology

## 2022-10-03 ENCOUNTER — Encounter (HOSPITAL_BASED_OUTPATIENT_CLINIC_OR_DEPARTMENT_OTHER): Payer: Self-pay | Admitting: Cardiology

## 2022-10-03 VITALS — BP 138/72 | HR 64 | Ht 68.5 in | Wt 145.6 lb

## 2022-10-03 DIAGNOSIS — I2510 Atherosclerotic heart disease of native coronary artery without angina pectoris: Secondary | ICD-10-CM

## 2022-10-03 DIAGNOSIS — E7800 Pure hypercholesterolemia, unspecified: Secondary | ICD-10-CM | POA: Diagnosis not present

## 2022-10-03 DIAGNOSIS — Z7189 Other specified counseling: Secondary | ICD-10-CM | POA: Diagnosis not present

## 2022-10-03 NOTE — Patient Instructions (Signed)
Medication Instructions:  The current medical regimen is effective;  continue present plan and medications.   *If you need a refill on your cardiac medications before your next appointment, please call your pharmacy*   Lab Work: None  If you have labs (blood work) drawn today and your tests are completely normal, you will receive your results only by: [place] (if you have MyChart) OR A paper copy in the mail If you have any lab test that is abnormal or we need to change your treatment, we will call you to review the results.   Testing/Procedures: None   Follow-Up: At [facility], you and your health needs are our priority.  As part of our continuing mission to provide you with exceptional heart care, we have created designated Provider Care Teams.  These Care Teams include your primary Cardiologist (physician) and Advanced Practice Providers (APPs -  Physician Assistants and Nurse Practitioners) who all work together to provide you with the care you need, when you need it.  We recommend signing up for the patient portal called "MyChart".  Sign up information is provided on this After Visit Summary.  MyChart is used to connect with patients for Virtual Visits (Telemedicine).  Patients are able to view lab/test results, encounter notes, upcoming appointments, etc.  Non-urgent messages can be sent to your provider as well.   To learn more about what you can do with MyChart, go to [email or web address].    Your next appointment:   2 year(s)  Provider:   Buford Dresser, MD    Other Instructions None

## 2022-10-03 NOTE — Progress Notes (Signed)
Cardiology Office Note:    Date:  10/03/2022   ID:  Christina Muscat Giulianna Rocha, DOB 12-Jan-1946, MRN [id number]  PCP:  Carol Ada, MD  Cardiologist:  Buford Dresser, MD PhD  Referring MD: Carol Ada, MD   CC: follow up  History of Present Illness:    Christina Edwards is a 77 y.o. female with a hx of PMH breast cancer, GERD, osteopenia who was seen for follow up today. I initially met her in the hospital on 02/04/19.  Cardiac history: Admitted 01/2019 with chest pain. ECG unremarkable, troponins negative x3. Elected for outpatient CTA. CTA with calcium score 187 (79th %), no obstructive CAD, minimal-mild plaque in LAD and RCA. Echo normal.  Patient was last seen by me 05/2020. She was doing well from a CV perspective at that time. However, she was s/p laparoscopic TAH-BSO (04/2020) and reported that on 9/15 while she was climbing in her new house she had a near syncopal event. Since that time she had a dull headache/fogginess but no further episodes of near syncope.  Today, she states that she has been doing well from a CV perspective. However, she reports being under significant stress currently as she has been the primary caregiver for her husband's aunt who is in her 32s and living with them. She reports feeling frustrated for the last few weeks as she has been responsible for taking her to all of her appointments and assisting her in most of her ADLs. Patient notes that she cannot leave her husband's aunt unsupervised and the longest she can go without attending to her husband's aunt is when she walks to the mailbox and back.  Patient states that she had a recent illness and has some lingering sinus symptoms.  We reviewed her outside labs including a CBC, CMP, and lipid panel which were all WNL. She is pleased with her results.  She denies any issues with hematuria or blood in her stools. She is tolerating her ASA well.  The patient denies palpitations, racing heartbeats,  chest pain, chest pressure, dyspnea at rest or with exertion, PND, orthopnea, or leg swelling. Denies syncope or presyncope. Denies dizziness or lightheadedness.    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/2019   LGSIL on Pap smear of cervix    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    Nocturnal leg cramps    Osteopenia 11/2018   T score -1.9 asked 9% / 1.4%   Personal history of radiation therapy    PONV (postoperative nausea and vomiting)     Past Surgical History:  Procedure Laterality Date   APPENDECTOMY  1954   BREAST LUMPECTOMY Left 1987   BREAST SURGERY  1987   Lumpectomy   CERVICAL BIOPSY  W/ LOOP ELECTRODE EXCISION     COLONOSCOPY  02/2020   CYSTOSCOPY N/A 05/15/2020   Procedure: CYSTOSCOPY;  Surgeon: Joseph Pierini, MD;  Location: [place];  Service: Gynecology;  Laterality: N/A;   EYE SURGERY     laser to fix torn retina   HAND SURGERY     left hand   LAPAROSCOPIC HYSTERECTOMY N/A 05/15/2020   Procedure: HYSTERECTOMY TOTAL LAPAROSCOPIC;  Surgeon: Joseph Pierini, MD;  Location: [place];  Service: Gynecology;  Laterality: N/A;  request 7:30 am OR time in [place] block requests two hours   LAPAROSCOPIC SALPINGO OOPHERECTOMY Right 05/15/2020   Procedure: LAPAROSCOPIC SALPINGO OOPHORECTOMY;  Surgeon: Joseph Pierini, MD;  Location: [facility];  Service: Gynecology;  Laterality: Right;   [place]    Current Medications: Current Outpatient Medications on File Prior to Visit  Medication Sig   Alum & Mag Hydroxide-Simeth (MYLANTA PO) Take by mouth as needed.   aspirin EC 81 MG tablet Take 1 tablet  (81 mg total) by mouth in the morning. Swallow whole.  Restart 1 week after surgery   Calcium Carbonate Antacid (TUMS PO) Take by mouth as needed.   Calcium Citrate-Vitamin D (CALCIUM + D PO) Take by mouth in the morning and at bedtime.   cyanocobalamin (VITAMIN B12) 500 MCG tablet Take 1,000 mcg by mouth daily.   diphenhydrAMINE HCl (BENADRYL PO) Take by mouth as needed.   fluticasone (FLONASE) 50 MCG/ACT nasal spray Place 2 sprays into both nostrils as needed.   IBUPROFEN PO Take by mouth.   rosuvastatin (CRESTOR) 5 MG tablet Take 1 tablet (5 mg total) by mouth daily.   No current facility-administered medications on file prior to visit.     Allergies:   Sulfa antibiotics, Adhesive [tape], Latex, and Neosporin [neomycin-bacitracin zn-polymyx]   Social History   Tobacco Use   Smoking status: Former   Smokeless tobacco: Never   Tobacco comments:    in college not much  Vaping Use   Vaping Use: Never used  Substance Use Topics   Alcohol use: No    Alcohol/week: 0.0 standard drinks of alcohol   Drug use: No    Family History: The patient's family history includes Breast cancer in her paternal grandmother; Cancer in her mother; Heart disease in her father; Hypertension in her mother; Ovarian cancer in her mother.  ROS:   Please see the history of present illness.  + Stress Additional pertinent ROS otherwise unremarkable.  EKGs/Labs/Other Studies Reviewed:    The following studies were reviewed today:  CT coronary 02/15/19 Aorta: Normal size. Ascending aorta 3.1 cm. Mild calcification of the descending aorta. No dissection.  Aortic Valve: Trileaflet. Mild calcification of the aortic annulus.  Coronary Arteries:  Normal coronary origin.  Right dominance.   RCA is a large dominant artery that gives rise to PDA and two PLV branches. There is minimal (<25%) calcified plaque in the proximal RCA.   Left main is a large artery that gives rise to LAD and LCX arteries. There is  minimal, non-calcified plaque.   LAD is a large vessel that has mild (25-49%) calcified plaque proximally. This can be overestimated due to blooming artifact. There are two diagonals without any significant plaque.   LCX is a non-dominant artery that gives rise to one large OM1 branch. There is no plaque.   Other findings:  Normal pulmonary vein drainage into the left atrium.  Normal let atrial appendage without a thrombus.  Normal size of the pulmonary artery.   IMPRESSION: 1. Coronary calcium score of 187. This was 79th percentile for age and sex matched control.   2. Normal coronary origin with right dominance.   3. No evidence of obstructive CAD.   4. Minimal plaque in the LAD and RCA. Mild plaque in the proximal LAD.   5. Recommend aggressive risk factor modification  including high potency statin.  Echo 02/05/19  1. The left ventricle has normal systolic function with an ejection fraction of 60-65%. The cavity size was normal. Left ventricular diastolic parameters were normal. No evidence of left ventricular regional wall motion abnormalities.  2. The right ventricle has normal systolic function. The cavity was normal. There is no increase in right ventricular wall thickness.  3. The TV is normal with mild TR.   EKG:  EKG personally reviewed. 10/03/22: NSR, iRBBB at 64 bpm 06/12/2020: NSR, iRBBB at 70 bpm.  Recent Labs: No results found for requested labs within last 365 days.  Recent Lipid Panel    Component Value Date/Time   CHOL 134 07/16/2020 0814   TRIG 106 07/16/2020 0814   HDL 57 07/16/2020 0814   CHOLHDL 2.4 07/16/2020 0814   CHOLHDL 4.2 02/05/2019 0213   VLDL 19 02/05/2019 0213   LDLCALC 58 07/16/2020 0814    Physical Exam:    VS:  BP 138/72 (BP Location: Right Arm, Patient Position: Sitting, Cuff Size: Normal)   Pulse 64   Ht 5' 8.5" (1.74 m)   Wt 145 lb 9.6 oz (66 kg)   LMP 09/22/2000   BMI 21.82 kg/m     Wt Readings from Last 3 Encounters:   10/03/22 145 lb 9.6 oz (66 kg)  02/27/22 153 lb (69.4 kg)  07/05/21 147 lb (66.7 kg)     GEN: Well nourished, well developed in no acute distress HEENT: Normal, moist mucous membranes NECK: No JVD CARDIAC: regular rhythm, normal S1 and S2, no rubs or gallops. No murmur. VASCULAR: Radial and DP pulses 2+ bilaterally. No carotid bruits RESPIRATORY:  Clear to auscultation without rales, wheezing or rhonchi  ABDOMEN: Soft, non-tender, non-distended MUSCULOSKELETAL:  Ambulates independently SKIN: Warm and dry, no edema NEUROLOGIC:  Alert and oriented x 3. No focal neuro deficits noted. PSYCHIATRIC:  Normal affect   ASSESSMENT:    1. Nonocclusive coronary atherosclerosis of native coronary artery   2. Coronary artery disease involving native coronary artery of native heart without angina pectoris   3. Pure hypercholesterolemia   4. Cardiac risk counseling   5. Counseling on health promotion and disease prevention     PLAN:    Nonobstructive CAD on coronary CTA: Coronary CTA without obstructive CAD but does show evidence of nonobstructive CAD -tolerating low dose rosuvastatin -no further chest pain -normal echo -tolerating aspirin 81 mg daily.  Hypercholesterolemia:  -symptoms on atorvastatin 40 mg, improved with stopping -continue 5 mg rosuvastatin. LDL 75 03/03/22, previously <70. Will hold on changes today given prior symptoms on high intensity statin, continue to monitor. If LDL rises, would increase rosuvastatin to 10 mg and monitor for symptoms.  Cardiac risk counseling and prevention recommendations: made excellent changes since hospitalization -recommend heart healthy/Mediterranean diet, with whole grains, fruits, vegetable, fish, lean meats, nuts, and olive oil. Limit salt. -recommend moderate walking, 3-5 times/week for 30-50 minutes each session. Aim for at least 150 minutes.week. Goal should be pace of 3 miles/hours, or walking 1.5 miles in 30 minutes -recommend  avoidance of tobacco products. Avoid excess alcohol.  Plan for follow up: 2 years or sooner if needed.  Medication Adjustments/Labs and Tests Ordered: Current medicines are reviewed at length with the patient today.  Concerns regarding medicines are outlined above.  Orders Placed This Encounter  Procedures   EKG 12-Lead   No orders of the defined types were placed in this encounter.   Patient Instructions  Medication Instructions:  The current  medical regimen is effective;  continue present plan and medications.   *If you need a refill on your cardiac medications before your next appointment, please call your pharmacy*   Lab Work: None  If you have labs (blood work) drawn today and your tests are completely normal, you will receive your results only by: [place] (if you have MyChart) OR A paper copy in the mail If you have any lab test that is abnormal or we need to change your treatment, we will call you to review the results.   Testing/Procedures: None   Follow-Up: At [facility], you and your health needs are our priority.  As part of our continuing mission to provide you with exceptional heart care, we have created designated Provider Care Teams.  These Care Teams include your primary Cardiologist (physician) and Advanced Practice Providers (APPs -  Physician Assistants and Nurse Practitioners) who all work together to provide you with the care you need, when you need it.  We recommend signing up for the patient portal called "MyChart".  Sign up information is provided on this After Visit Summary.  MyChart is used to connect with patients for Virtual Visits (Telemedicine).  Patients are able to view lab/test results, encounter notes, upcoming appointments, etc.  Non-urgent messages can be sent to your provider as well.   To learn more about what you can do with MyChart, go to [email or web address].    Your next appointment:   2 year(s)  Provider:    Buford Dresser, MD    Other Instructions None    I,Alexis Herring,acting as a scribe for Buford Dresser, MD.,have documented all relevant documentation on the behalf of Buford Dresser, MD,as directed by  Buford Dresser, MD while in the presence of Buford Dresser, MD.  I, Buford Dresser, MD, have reviewed all documentation for this visit. The documentation on 10/03/22 for the exam, diagnosis, procedures, and orders are all accurate and complete.   Signed, Buford Dresser, MD PhD 10/03/2022 10:15 AM    [place]

## 2022-10-06 ENCOUNTER — Encounter (HOSPITAL_BASED_OUTPATIENT_CLINIC_OR_DEPARTMENT_OTHER): Payer: Self-pay

## 2022-10-20 ENCOUNTER — Other Ambulatory Visit (HOSPITAL_BASED_OUTPATIENT_CLINIC_OR_DEPARTMENT_OTHER): Payer: Self-pay | Admitting: Cardiology

## 2022-10-20 NOTE — Telephone Encounter (Signed)
Rx(s) sent to pharmacy electronically.  

## 2022-10-25 NOTE — Progress Notes (Deleted)
Cardiology Office Note:    Date:  10/25/2022   ID:  Christina Edwards, DOB August 17, 1946, MRN [id number]  PCP:  Christina Ada, MD  Cardiologist:  Buford Dresser, MD  Electrophysiologist:  None   Referring MD: Christina Ada, MD   Chief Complaint: increased stress   History of Present Illness:    Christina Edwards is a 77 y.o. female with a history of non-obstructive CAD on coronary CTA in 01/2019, hyperlipidemia, GERD, diverticulitis, osteopenia, and breast cancer who is followed by Dr. Harrell Gave and presents today for follow-up increased stress. ***  Patient was first seen by Dr. Harrell Gave in 01/2019 during an admission for chest pain. EKG was unremarkable and high-sensitivity troponin was negative. Echo showed LVEF of 60-65% with normal wall motion as well as mild MR and mild TR. Outpatient coronary CTA was recommended. CTA showed a coronary caclium score of 187 (79th percentile for age and sex) with minimal plaque in the LAD and RCA and mild plaque in the proximal LAD but no evidence of obstructive CAD.  She was last seen by Dr. Harrell Gave on 10/03/2022 at which time she was doing well from a cardiac standpoint and denied. However, she reported being under significant stress currently as she is the primary caregiver for her husband's aunt who is in her 32s and living with them. No medications changes were made and she was advised to follow-up in 2 years.   ***  Stress Patient present today due to increased stress at home. She is under significant stress due to being the primary caregiver of her husband's aunt who is in er 53s and lives with them. ***  Non-Obstructive CAD Coronary CTA in 01/2019 showed a coronary caclium score of 187 (79th percentile for age and sex) with minimal plaque in the LAD and RCA and mild plaque in the proximal LAD but no evidence of obstructive CAD. - No chest pain.  - Continue aspirin and statin.  Hyperlipidemia LDL 75 in 02/2022 but  previously <70. LDL goal <70 given CAD. - She was unable to tolerate Lipitor '40mg'$  in the past. However, she has been doing well on Crestor '5mg'$  daily. Will continue current dose for now given she is close to goal and she had prior symptoms on a high-intensity statin. If LDL rises, would recommend increasing Crestor to '10mg'$  daily and monitoring for symptoms.   Mild Mitral Regurgitation Mild Tricuspid Regurgitation  Noted on Echo in 01/2019. - Did not talk about this today. Consider ordering repeat Echo at next office visit.   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   LGSIL on Pap smear of cervix    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    Nocturnal leg cramps    Osteopenia 11/2018   T score -1.9 asked 9% / 1.4%   Personal history of radiation therapy    PONV (postoperative nausea and vomiting)     Past Surgical History:  Procedure Laterality Date   APPENDECTOMY  1954   BREAST LUMPECTOMY Left 1987   BREAST SURGERY  1987   Lumpectomy   CERVICAL BIOPSY  W/ LOOP ELECTRODE EXCISION     COLONOSCOPY  02/2020   CYSTOSCOPY N/A 05/15/2020  Procedure: CYSTOSCOPY;  Surgeon: Joseph Pierini, MD;  Location: [facility];  Service: Gynecology;  Laterality: N/A;   EYE SURGERY     laser to fix torn retina   HAND SURGERY     left hand   LAPAROSCOPIC HYSTERECTOMY N/A 05/15/2020   Procedure: HYSTERECTOMY TOTAL LAPAROSCOPIC;  Surgeon: Joseph Pierini, MD;  Location: [place];  Service: Gynecology;  Laterality: N/A;  request 7:30 am OR time in [place] block requests two hours   LAPAROSCOPIC SALPINGO OOPHERECTOMY Right 05/15/2020   Procedure: LAPAROSCOPIC SALPINGO OOPHORECTOMY;  Surgeon: Joseph Pierini,  MD;  Location: [facility];  Service: Gynecology;  Laterality: Right;   [place]    Current Medications: No outpatient medications have been marked as taking for the 10/29/22 encounter (Appointment) with Darreld Mclean, PA-C.     Allergies:   Sulfa antibiotics, Adhesive [tape], Latex, and Neosporin [neomycin-bacitracin zn-polymyx]   Social History   Socioeconomic History   Marital status: Married    Spouse name: Not on file   Number of children: Not on file   Years of education: Not on file   Highest education level: Not on file  Occupational History   Not on file  Tobacco Use   Smoking status: Former   Smokeless tobacco: Never   Tobacco comments:    in college not much  Vaping Use   Vaping Use: Never used  Substance and Sexual Activity   Alcohol use: No    Alcohol/week: 0.0 standard drinks of alcohol   Drug use: No   Sexual activity: Yes    Birth control/protection: Surgical    Comment: 1st intercourse 77 yo-Fewer than 5 partners, hysterectomy  Other Topics Concern   Not on file  Social History Narrative   Not on file   Social Determinants of Health   Financial Resource Strain: Not on file  Food Insecurity: Not on file  Transportation Needs: Not on file  Physical Activity: Not on file  Stress: Not on file  Social Connections: Not on file     Family History: The patient's family history includes Breast cancer in her paternal grandmother; Cancer in her mother; Heart disease in her father; Hypertension in her mother; Ovarian cancer in her mother.  ROS:   Please see the history of present illness.     EKGs/Labs/Other Studies Reviewed:    The following studies were reviewed:  Echocardiogram 02/05/2019: Impression: 1. The left ventricle has normal systolic function with an ejection  fraction of 60-65%. The cavity size was normal. Left ventricular diastolic  parameters were normal.  No evidence of left ventricular regional wall  motion abnormalities.   2. The right ventricle has normal systolic function. The cavity was  normal. There is no increase in right ventricular wall thickness.   3. The TV is normal with mild TR.  _______________  Coronary CTA 02/15/2019: Impression: 1. Coronary calcium score of 187. This was 79th percentile for age and sex matched control. 2. Normal coronary origin with right dominance. 3. No evidence of obstructive CAD. 4. Minimal plaque in the LAD and RCA. Mild plaque in the proximal LAD. 5. Recommend aggressive risk factor modification including high potency statin.   EKG:  EKG not ordered today.   Recent Labs: No results found for requested labs within last 365 days.  Recent Lipid Panel    Component Value Date/Time   CHOL 134  07/16/2020 0814   TRIG 106 07/16/2020 0814   HDL 57 07/16/2020 0814   CHOLHDL 2.4 07/16/2020 0814   CHOLHDL 4.2 02/05/2019 0213   VLDL 19 02/05/2019 0213   LDLCALC 58 07/16/2020 0814    Physical Exam:    Vital Signs: LMP 09/22/2000     Wt Readings from Last 3 Encounters:  10/03/22 145 lb 9.6 oz (66 kg)  02/27/22 153 lb (69.4 kg)  07/05/21 147 lb (66.7 kg)     General: 77 y.o. female in no acute distress. HEENT: Normocephalic and atraumatic. Sclera clear. EOMs intact. Neck: Supple. No carotid bruits. No JVD. Heart: *** RRR. Distinct S1 and S2. No murmurs, gallops, or rubs. Radial and distal pedal pulses 2+ and equal bilaterally. Lungs: No increased work of breathing. Clear to ausculation bilaterally. No wheezes, rhonchi, or rales.  Abdomen: Soft, non-distended, and non-tender to palpation. Bowel sounds present in all 4 quadrants.  MSK: Normal strength and tone for age. *** Extremities: No lower extremity edema.    Skin: Warm and dry. Neuro: Alert and oriented x3. No focal deficits. Psych: Normal affect. Responds appropriately.   Assessment:    No diagnosis found.  Plan:      Disposition: Follow up in ***   Medication Adjustments/Labs and Tests Ordered: Current medicines are reviewed at length with the patient today.  Concerns regarding medicines are outlined above.  No orders of the defined types were placed in this encounter.  No orders of the defined types were placed in this encounter.   There are no Patient Instructions on file for this visit.   Signed, Darreld Mclean, PA-C  10/25/2022 7:41 AM    [place]

## 2022-10-29 ENCOUNTER — Ambulatory Visit: Payer: Medicare Other | Admitting: Student

## 2022-11-04 ENCOUNTER — Ambulatory Visit (INDEPENDENT_AMBULATORY_CARE_PROVIDER_SITE_OTHER): Payer: Medicare Other | Admitting: Obstetrics & Gynecology

## 2022-11-04 ENCOUNTER — Encounter: Payer: Self-pay | Admitting: Obstetrics & Gynecology

## 2022-11-04 ENCOUNTER — Other Ambulatory Visit (HOSPITAL_COMMUNITY)
Admission: RE | Admit: 2022-11-04 | Discharge: 2022-11-04 | Disposition: A | Discharge status: Home / Self Care | Payer: Medicare Other | Source: Ambulatory Visit | Attending: Obstetrics & Gynecology | Admitting: Obstetrics & Gynecology

## 2022-11-04 VITALS — BP 120/76 | HR 76 | Ht 68.75 in | Wt 138.0 lb

## 2022-11-04 DIAGNOSIS — Z9189 Other specified personal risk factors, not elsewhere classified: Secondary | ICD-10-CM | POA: Diagnosis not present

## 2022-11-04 DIAGNOSIS — N890 Mild vaginal dysplasia: Secondary | ICD-10-CM | POA: Insufficient documentation

## 2022-11-04 DIAGNOSIS — Z1272 Encounter for screening for malignant neoplasm of vagina: Secondary | ICD-10-CM | POA: Diagnosis present

## 2022-11-04 DIAGNOSIS — Z01411 Encounter for gynecological examination (general) (routine) with abnormal findings: Secondary | ICD-10-CM | POA: Diagnosis not present

## 2022-11-04 DIAGNOSIS — Z90710 Acquired absence of both cervix and uterus: Secondary | ICD-10-CM | POA: Diagnosis not present

## 2022-11-04 DIAGNOSIS — C50912 Malignant neoplasm of unspecified site of left female breast: Secondary | ICD-10-CM

## 2022-11-04 DIAGNOSIS — Z1151 Encounter for screening for human papillomavirus (HPV): Secondary | ICD-10-CM | POA: Diagnosis not present

## 2022-11-04 DIAGNOSIS — Z01419 Encounter for gynecological examination (general) (routine) without abnormal findings: Secondary | ICD-10-CM | POA: Insufficient documentation

## 2022-11-04 DIAGNOSIS — R87612 Low grade squamous intraepithelial lesion on cytologic smear of cervix (LGSIL): Secondary | ICD-10-CM | POA: Insufficient documentation

## 2022-11-04 DIAGNOSIS — R87810 Cervical high risk human papillomavirus (HPV) DNA test positive: Secondary | ICD-10-CM | POA: Insufficient documentation

## 2022-11-04 DIAGNOSIS — Z9289 Personal history of other medical treatment: Secondary | ICD-10-CM

## 2022-11-04 NOTE — Progress Notes (Signed)
Christina Edwards 1945/10/29 [id number]   History:    77 y.o.  G1P1L1 Married   RP:  Established patient presenting for annual gyn exam    HPI: S/P LAVH/BSO.  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  Last Pap LGSIL 02/2022.  Repeat Pap/HPV HR today.  Postmenopausal, well on no HRT.  Rare hot flushes. History of left breast cancer.  Mammogram Neg 02/2022. Colonoscopy 03/2018. DEXA 06/2021 Osteopenia was stable with AP Spine at -1.8. Health labs with Fam MD. BMI 20.53.  Physically active.   Past medical history,surgical history, family history and social history were all reviewed and documented in the EPIC chart.  Gynecologic History Patient's last menstrual period was 09/22/2000.  Obstetric History OB History  Gravida Para Term Preterm AB Living  1 1 1     1  $ SAB IAB Ectopic Multiple Live Births               # Outcome Date GA Lbr Len/2nd Weight Sex Delivery Anes PTL Lv  1 Term              ROS: A ROS was performed and pertinent positives and negatives are included in the history. GENERAL: No fevers or chills. HEENT: No change in vision, no earache, sore throat or sinus congestion. NECK: No pain or stiffness. CARDIOVASCULAR: No chest pain or pressure. No palpitations. PULMONARY: No shortness of breath, cough or wheeze. GASTROINTESTINAL: No abdominal pain, nausea, vomiting or diarrhea, melena or bright red blood per rectum. GENITOURINARY: No urinary frequency, urgency, hesitancy or dysuria. MUSCULOSKELETAL: No joint or muscle pain, no back pain, no recent trauma. DERMATOLOGIC: No rash, no itching, no lesions. ENDOCRINE: No polyuria, polydipsia, no heat or cold intolerance. No recent change in weight. HEMATOLOGICAL: No anemia or easy bruising or bleeding. NEUROLOGIC: No headache, seizures, numbness, tingling or weakness. PSYCHIATRIC: No depression, no loss of interest in normal activity or change in sleep pattern.     Exam:   Ht 5' 8.75" (1.746 m)   Wt 138 lb (62.6 kg)   LMP  09/22/2000   BMI 20.53 kg/m   Body mass index is 20.53 kg/m.  General appearance : Well developed well nourished female. No acute distress HEENT: Eyes: no retinal hemorrhage or exudates,  Neck supple, trachea midline, no carotid bruits, no thyroidmegaly Lungs: Clear to auscultation, no rhonchi or wheezes, or rib retractions  Heart: Regular rate and rhythm, no murmurs or gallops Breast:Examined in sitting and supine position were symmetrical in appearance, no palpable masses or tenderness,  no skin retraction, no nipple inversion, no nipple discharge, no skin discoloration, no axillary or supraclavicular lymphadenopathy Abdomen: no palpable masses or tenderness, no rebound or guarding Extremities: no edema or skin discoloration or tenderness  Pelvic: Vulva: Normal with atrophy of menopause             Vagina: No gross lesions or discharge.  Pap/HPV HR done.  Cervix/Uterus absent  Adnexa  Without masses or tenderness  Anus: Normal   Assessment/Plan:  77 y.o. female for annual exam   1. Encounter for Papanicolaou smear of vagina as part of routine gynecological examination S/P LAVH/BSO.  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  Last Pap LGSIL 02/2022.  Repeat Pap/HPV HR today.  Postmenopausal, well on no HRT.  Rare hot flushes. History of left breast cancer.  Mammogram Neg 02/2022. Colonoscopy 03/2018. DEXA 06/2021 Osteopenia was stable with AP Spine at -1.8. Health labs with Fam MD. BMI 20.53.  Physically active. - Cytology - PAP( [place])  2. S/P laparoscopic hysterectomy/BSO  3. VAIN I (vaginal intraepithelial neoplasia grade I) - Cytology - PAP( [place])  4. Malignant neoplasm of left female breast, unspecified estrogen receptor status, unspecified site of breast ([place])  5. Personal history of other medical treatment  Other orders - traZODone (DESYREL) 50 MG tablet; Takes 1/2 - Cyanocobalamin (B-12 PO); Take 1,000 mg by mouth daily. - fexofenadine (ALLEGRA) 180 MG tablet;  Take 180 mg by mouth daily. - SALINE NASAL SPRAY NA; Place into the nose.   [name] MD, 9:33 AM

## 2022-11-07 LAB — CYTOLOGY - PAP
Adequacy: ABSENT
Comment: NEGATIVE
High risk HPV: POSITIVE — AB

## 2022-11-17 ENCOUNTER — Ambulatory Visit (HOSPITAL_BASED_OUTPATIENT_CLINIC_OR_DEPARTMENT_OTHER): Payer: Medicare Other | Admitting: Cardiology

## 2023-01-23 ENCOUNTER — Other Ambulatory Visit: Payer: Self-pay | Admitting: Family Medicine

## 2023-01-23 DIAGNOSIS — Z1231 Encounter for screening mammogram for malignant neoplasm of breast: Secondary | ICD-10-CM

## 2023-03-04 ENCOUNTER — Ambulatory Visit
Admission: RE | Admit: 2023-03-04 | Discharge: 2023-03-04 | Disposition: A | Discharge status: Home / Self Care | Payer: Medicare Other | Source: Ambulatory Visit | Attending: Family Medicine | Admitting: Family Medicine

## 2023-03-04 DIAGNOSIS — Z1231 Encounter for screening mammogram for malignant neoplasm of breast: Secondary | ICD-10-CM

## 2023-05-12 ENCOUNTER — Ambulatory Visit: Payer: Medicare Other | Admitting: Obstetrics & Gynecology

## 2023-06-01 ENCOUNTER — Other Ambulatory Visit (HOSPITAL_COMMUNITY)
Admission: RE | Admit: 2023-06-01 | Discharge: 2023-06-01 | Disposition: A | Discharge status: Home / Self Care | Payer: Medicare Other | Source: Ambulatory Visit | Attending: Obstetrics and Gynecology | Admitting: Obstetrics and Gynecology

## 2023-06-01 ENCOUNTER — Encounter: Payer: Self-pay | Admitting: Obstetrics and Gynecology

## 2023-06-01 ENCOUNTER — Ambulatory Visit (INDEPENDENT_AMBULATORY_CARE_PROVIDER_SITE_OTHER): Payer: Medicare Other | Admitting: Obstetrics and Gynecology

## 2023-06-01 VITALS — BP 110/62 | HR 70 | Wt 132.0 lb

## 2023-06-01 DIAGNOSIS — R87611 Atypical squamous cells cannot exclude high grade squamous intraepithelial lesion on cytologic smear of cervix (ASC-H): Secondary | ICD-10-CM

## 2023-06-01 DIAGNOSIS — Z8742 Personal history of other diseases of the female genital tract: Secondary | ICD-10-CM | POA: Insufficient documentation

## 2023-06-01 NOTE — Progress Notes (Addendum)
Christina Edwards Cathia Eckenrode 24-Apr-1946 [id number]   History:    77 y.o.  G1P1L1 Married   RP:  Established patient presenting for annual gyn exam    HPI: S/P LAVH/BSO.  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  Last Pap LGSIL 02/2022.  Repeat Pap/HPV HR today.  Postmenopausal, well on no HRT.  Rare hot flushes. History of left breast cancer.  Mammogram Neg 02/2022. Colonoscopy 03/2018. DEXA 06/2021 Osteopenia was stable with AP Spine at -1.8. Health labs with Fam MD. BMI 20.53.  Physically active.  Gynecologic History Patient's last menstrual period was 09/22/2000.  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/2019   LGSIL on Pap smear of cervix    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    Nocturnal leg cramps    Osteopenia 11/2018   T score -1.9 asked 9% / 1.4%   Personal history of radiation therapy    PONV (postoperative nausea and vomiting)    Past Surgical History:  Procedure Laterality Date   APPENDECTOMY  1954   BREAST LUMPECTOMY Left 1987   BREAST SURGERY  1987   Lumpectomy   CERVICAL BIOPSY  W/ LOOP ELECTRODE EXCISION     COLONOSCOPY  02/2020   CYSTOSCOPY N/A 05/15/2020   Procedure: CYSTOSCOPY;  Surgeon: Theresia Majors, MD;  Location: [facility] [place];  Service: Gynecology;  Laterality: N/A;   EYE SURGERY     laser to fix torn retina   HAND SURGERY     left hand   LAPAROSCOPIC HYSTERECTOMY N/A 05/15/2020   Procedure: HYSTERECTOMY TOTAL LAPAROSCOPIC;  Surgeon: Theresia Majors, MD;  [facility];  Service: Gynecology;  Laterality: N/A;  request 7:30 am OR time in [place] Gyn block requests two hours   LAPAROSCOPIC SALPINGO OOPHERECTOMY Right 05/15/2020   Procedure: LAPAROSCOPIC SALPINGO OOPHORECTOMY;  Surgeon: Theresia Majors, MD;  Location: [facility];  Service: Gynecology;  Laterality: Right;   OOPHORECTOMY  1989   LSO   TONSILLECTOMY  1955   TUBAL LIGATION  1984   Social History   Socioeconomic History   Marital status: Married    Spouse name: Not on file   Number of children: Not on file   Years of education: Not on file   Highest education level: Not on file  Occupational History   Not on file  Tobacco Use   Smoking status: Former   Smokeless tobacco: Never   Tobacco comments:    in college not much  Vaping Use   Vaping status: Never Used  Substance and Sexual Activity   Alcohol use: No    Alcohol/week: 0.0 standard drinks of alcohol   Drug use: No   Sexual activity: Yes    Birth control/protection: Surgical    Comment: 1st intercourse 77 yo-Fewer than 5  partners, hysterectomy  Other Topics Concern   Not on file  Social History Narrative   Not on file   Social Determinants of Health   Financial Resource Strain: Not on file  Food Insecurity: Not on file  Transportation Needs: Not on file  Physical Activity: Not on file  Stress: Not on file  Social Connections: Not on file  Intimate Partner Violence: Not on file    ROS: A ROS was performed and pertinent positives and negatives are included in the history. GENERAL: No fevers or chills. HEENT: No change in vision, no earache, sore throat or sinus congestion. NECK: No pain or stiffness. CARDIOVASCULAR: No chest pain or pressure. No palpitations. PULMONARY: No shortness of breath, cough or wheeze. GASTROINTESTINAL: No abdominal pain, nausea, vomiting or diarrhea, melena or bright red blood per rectum. GENITOURINARY: No urinary frequency, urgency, hesitancy or dysuria.  MUSCULOSKELETAL: No joint or muscle pain, no back pain, no recent trauma. DERMATOLOGIC: No rash, no itching, no lesions. ENDOCRINE: No polyuria, polydipsia, no heat or cold intolerance. No recent change in weight. HEMATOLOGICAL: No anemia or easy bruising or bleeding. NEUROLOGIC: No headache, seizures, numbness, tingling or weakness. PSYCHIATRIC: No depression, no loss of interest in normal activity or change in sleep pattern.     Exam:  Blood pressure 110/62, pulse 70, weight 132 lb (59.9 kg), last menstrual period 09/22/2000, SpO2 99%.   General appearance : Well developed well nourished female. No acute distress HEENT: Eyes: no retinal hemorrhage or exudates,  Neck supple, trachea midline, no carotid bruits, no thyroidmegaly Lungs: Clear to auscultation, no rhonchi or wheezes, or rib retractions  Heart: Regular rate and rhythm, no murmurs or gallops Breast:Examined in sitting and supine position were symmetrical in appearance, no palpable masses or tenderness,  no skin retraction, no nipple inversion, no nipple discharge, no skin discoloration, no axillary or supraclavicular lymphadenopathy Abdomen: no palpable masses or tenderness, no rebound or guarding Extremities: no edema or skin discoloration or tenderness  Pelvic: Vulva: hypopigmentation noted to the posterior right of the hymen             Vagina: No gross lesions or discharge.  Pap/HPV HR done.  Cervix/Uterus absent  Adnexa  Without masses or tenderness  Anus: Normal   Assessment/Plan:  77 y.o. female fo repeat pap smear  1. Encounter for Papanicolaou smear of vagina as part of routine gynecological examination S/P LAVH/BSO.  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  Last Pap LGSIL 02/2022.  Repeat Pap/HPV HR today.  Postmenopausal, well on no HRT.  Rare hot flushes. History of left breast cancer.  Mammogram Neg 02/2022. Colonoscopy 03/2018. DEXA 06/2021 Osteopenia was stable with AP Spine at -1.8. Health labs with Fam MD. BMI 20.53.   Physically active. - Cytology - PAP( [place])  2. S/P laparoscopic hysterectomy/BSO  3. VAIN I (vaginal intraepithelial neoplasia grade I) - Cytology - PAP( [place])  4. Malignant neoplasm of left female breast, unspecified estrogen receptor status, unspecified site of breast (HCC)  Pap smear collected.  To return for possible colposcopy and punch biopsy  Earley Favor MD, 10:16 AM

## 2023-06-02 NOTE — Addendum Note (Signed)
Addended by: Earley Favor on: 06/02/2023 05:43 PM   Modules accepted: Orders

## 2023-06-10 ENCOUNTER — Other Ambulatory Visit: Payer: Self-pay

## 2023-06-10 DIAGNOSIS — R87622 Low grade squamous intraepithelial lesion on cytologic smear of vagina (LGSIL): Secondary | ICD-10-CM

## 2023-06-10 DIAGNOSIS — N890 Mild vaginal dysplasia: Secondary | ICD-10-CM

## 2023-06-12 LAB — CYTOLOGY - PAP

## 2023-06-19 ENCOUNTER — Telehealth: Payer: Self-pay | Admitting: *Deleted

## 2023-06-19 NOTE — Telephone Encounter (Signed)
Spoke with the patient to schedule a new patient appt on 10/18. Patient stated she is out of town and will call bacck next week to schedule

## 2023-06-24 ENCOUNTER — Encounter: Payer: Medicare Other | Admitting: Obstetrics and Gynecology

## 2023-07-15 ENCOUNTER — Encounter: Payer: Self-pay | Admitting: Gynecologic Oncology

## 2023-07-16 NOTE — Progress Notes (Signed)
GYNECOLOGIC ONCOLOGY NEW PATIENT CONSULTATION   Patient Name: Christina Edwards  Patient Age: 77 y.o. Date of Service: 07/17/23 Referring Provider: Dr. 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  We reviewed her prior history of low grade dysplasia and HPV testing.  I spent some time reviewing the role that HPV plays in the development of much cervical/vaginal/vulvar dysplasia and cancer.  She did not have HPV testing with her most recent Pap.  We had discussed collecting a specimen to do HPV subtyping.  I think this can be done at the time of her next Pap test, a year after her last.  I would recommend at this time collecting Pap test, HPV testing and specifically HPV subtyping.  We discussed that the more oncogenic high risk strains are 16, 18, and 45.  While it would not change my management recommendations, it would be helpful to know if she has one of the strains or one of the other high risk strains.  We reviewed her history.  The patient does not appear to have any modifiable risk related to immunosuppression (such as diagnoses or medications) that would increase her susceptibility to HPV.  Colposcopy was performed today with no visible abnormality noted.  Biopsy was not taken.   We briefly discussed that low grade vaginal dysplasia is typically followed with close surveillance (without treatment) given the low risk of progression of high grade dysplasia or malignancy; however, I suggested starting vaginal estrogen as there is literature to support therapeutic benefit in both low-grade vaginal dysplasia (as well as high-grade vaginal dysplasia in combination with other treatment modalities).  Prescription for this was sent in to her pharmacy today.   The patient  has a remote history of breast cancer.  This was treated in the 80s.  Given the long interval since her diagnosis, there is not a contraindication to the use of vaginal estrogen (even if her cancer had been ER+).  Patient pelvic pain was not reproducible on exam today, and she actually appeared to have resolution of her pain after the exam.  I would encourage follow-up with her OB/GYN or primary care provider if this recurs/continues.  A copy of this note was sent to the patient's referring provider.   60 minutes of total time was spent for this patient encounter, including preparation, face-to-face counseling with the patient and coordination of care, and documentation of the encounter.  Eugene Garnet, MD  Division of Gynecologic Oncology  Department of Obstetrics and Gynecology  [facility]  ___________________________________________  Chief Complaint: Chief Complaint  Patient presents with   LGSIL on Pap smear of cervix    History of Present Illness:  Christina Edwards is a 77 y.o. y.o. female who is seen in consultation at the request of Dr. Karma Greaser for an evaluation of persistent low-grade vaginal dysplasia.  Patient's dysplasia history is outlined below. 2010: Pap NIML 2012: Pap NIML 2013: Pap LSIL, HR HPV+ 08/2012: Colposcopy -benign cervical biopsy, ECC favored to represent LSIL 02/2013: Pap LSIL, HR HPV+. Cervical biopsy benign 03/2013: ECC again favored to represent LSIL 08/2013: Pap LSIL, HR HPV+ 02/2014: Pap LSIL, HR HPV+ 08/2014: Pap LSIL, HR HPV+ 02/2015: Pap LSIL, HR HPV+ 08/2015: Pap LSIL, HR HPV+ 09/2015: ECC with mucin and scant endocervical cells 08/2016: Pap  LSIL, HR HPV+ 02/2017: LSIL pap 08/2017: Pap LSIL, HR HPV+ 09/2017: Colposcopy performed, cervical biopsy with koilocytic atypia focally 03/2018: Pap LSIL, HR HPV+ 09/2018: Pap LSIL, HR HPV+ 03/2019: Pap HSIL, HR HPV+ 04/2019: Colposcopy form, cervical biopsy with microscopic  squamous fragments with mild atypia, benign endocervical mucosa  04/2019: Cervical biopsy and ECC with LSIL 09/2019: Pap HSIL, HR HPV+ 04/2020: Patient underwent total hysterectomy with right salpingo-oophorectomy.  High-grade squamous intraepithelial lesion, CIN-2 of the cervix.  Benign endometrial polyp.  Normal fallopian tube and ovary. 06/2021: LSIL pap. 07/2021: HR HPV negative.  Colposcopy performed with a right vaginal biopsy showing L SIL, no high-grade dysplasia or malignancy. 02/2022: LSIL pap. 10/2022: Pap LSIL, HR HPV+ 05/2023: Pap LSIL  Her history is notable for left breast cancer diagnosed in the 80s.  This was treated with lumpectomy and radiation.  The patient denies ever being on antiestrogen therapy.  She is unsure whether it was hormone receptor positive.  Patient endorses since her last pelvic exam that she has had some cramps in her pelvis which she describes as constant and rates it a 5-6 out of 10 on a 10 point pain scale.  She uses ibuprofen which helps to ease the pain somewhat.  She denies associated symptoms.  She reports her bowel movements are normal, occasionally has thin stool, but this is her baseline.  She wears a thin pad and notes intermittently small amount of discharge, unchanged.  She denies any vaginal bleeding.  She endorses a good appetite.  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/2019   LGSIL on Pap smear of cervix    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    Nocturnal leg cramps    Osteopenia 11/2018   T score -1.9 asked 9% / 1.4%   Personal history of radiation therapy    PONV  (postoperative nausea and vomiting)      PAST SURGICAL HISTORY:  Past Surgical History:  Procedure Laterality Date   APPENDECTOMY  1954   BREAST LUMPECTOMY Left 1987   BREAST SURGERY  1987   Lumpectomy   CERVICAL BIOPSY  W/ LOOP ELECTRODE EXCISION     COLONOSCOPY  02/2020   CYSTOSCOPY N/A 05/15/2020   Procedure: CYSTOSCOPY;  Surgeon: Theresia Majors, MD;  Location: [facility];  Service: Gynecology;  Laterality: N/A;   EYE SURGERY     laser to fix torn retina   HAND SURGERY     left hand   LAPAROSCOPIC HYSTERECTOMY N/A 05/15/2020   Procedure: HYSTERECTOMY TOTAL LAPAROSCOPIC;  Surgeon: Theresia Majors, MD;  Location: [facility] [place];  Service: Gynecology;  Laterality: N/A;  request 7:30 am OR time in [place] Gyn block requests two hours   LAPAROSCOPIC SALPINGO OOPHERECTOMY Right 05/15/2020   Procedure: LAPAROSCOPIC SALPINGO OOPHORECTOMY;  Surgeon: Theresia Majors, MD;  Location: [facility];  Service: Gynecology;  Laterality: Right;   OOPHORECTOMY  1989   LSO   TONSILLECTOMY  1955   TUBAL LIGATION  1984    OB/GYN HISTORY:  OB History  Gravida Para Term Preterm AB Living  1 1 1     1   SAB IAB Ectopic Multiple Live Births               # Outcome Date GA Lbr Len/2nd Weight Sex Type Anes PTL Lv  1 Term             Patient's last menstrual period was 09/22/2000.  Age at menarche: 62 Age at menopause: mid 82s Hx of HRT: Denies Hx of STDs: HPV Last pap: see Hpi   History of abnormal pap smears: see HPI  SCREENING STUDIES:  Last mammogram: 02/2023  Last colonoscopy: 2019 Last bone mineral density: 2022  MEDICATIONS: Outpatient Encounter Medications as of 07/17/2023  Medication Sig   Alum & Mag Hydroxide-Simeth (MYLANTA PO) Take by mouth as needed.   aspirin EC 81 MG tablet Take 1 tablet (81 mg total) by mouth in the morning. Swallow whole.  Restart 1 week after surgery   Calcium Carbonate Antacid (TUMS PO) Take by mouth as  needed.   Calcium Citrate-Vitamin D (CALCIUM + D PO) Take by mouth in the morning and at bedtime.   Cholecalciferol (VITAMIN D3 PO) Take by mouth.   conjugated estrogens (PREMARIN) vaginal cream Place 1 Applicatorful vaginally 3 (three) times a week.   Cyanocobalamin (B-12 PO) Take 1,000 mg by mouth daily.   diphenhydrAMINE HCl (BENADRYL PO) Take by mouth as needed.   fexofenadine (ALLEGRA) 180 MG tablet Take 180 mg by mouth daily.   IBUPROFEN PO Take by mouth.   rosuvastatin (CRESTOR) 5 MG tablet TAKE 1 TABLET BY MOUTH DAILY   SALINE NASAL SPRAY NA Place into the nose.   No facility-administered encounter medications on file as of 07/17/2023.    ALLERGIES:  Allergies  Allergen Reactions   Sulfa Antibiotics Other (See Comments)    Reaction unknown   Adhesive [Tape] Rash   Latex Rash     FAMILY HISTORY:  Family History  Problem Relation Age of Onset   Hypertension Mother    Ovarian cancer Mother        dx a month before her death, metastatic   Colon cancer Mother    Heart disease Father    Breast cancer Paternal Grandmother        Age 7's   Endometrial cancer Neg Hx    Prostate cancer Neg Hx    Pancreatic cancer Neg Hx      SOCIAL HISTORY:  Social Connections: Not on file    REVIEW OF SYSTEMS:  + cough, abdominal pain, pelvic pain, hot flashes, headache, anxiety Denies appetite changes, fevers, chills, fatigue, unexplained weight changes. Denies hearing loss, neck lumps or masses, mouth sores, ringing in ears or voice changes. Denies wheezing.  Denies shortness of breath. Denies chest pain or palpitations. Denies leg swelling. Denies abdominal distention, blood in stools, constipation, diarrhea, nausea, vomiting, or early satiety. Denies pain with intercourse, dysuria, frequency, hematuria or incontinence. Denies vaginal bleeding or vaginal discharge.   Denies joint pain, back pain or muscle pain/cramps. Denies itching, rash, or wounds. Denies dizziness, numbness  or seizures. Denies swollen lymph nodes or glands, denies easy bruising or bleeding. Denies depression, confusion, or decreased concentration.  Physical Exam:  Vital Signs for this encounter:  Blood pressure 133/65, pulse 61, temperature 98.2 F (36.8 C), temperature source Oral, resp. rate 20, height 5\' 9"  (1.753 m), weight 136 lb 3.2 oz (61.8 kg), last menstrual period 09/22/2000, SpO2 100%. Body mass index is 20.11 kg/m. General: Alert, oriented, no acute distress.  HEENT:  Normocephalic, atraumatic. Sclera anicteric.  Chest: Clear to auscultation bilaterally. No wheezes, rhonchi, or rales. Cardiovascular: Regular rate and rhythm, no murmurs, rubs, or gallops.  Abdomen: Normoactive bowel sounds. Soft, nondistended, nontender to palpation. No masses or hepatosplenomegaly appreciated. No palpable fluid wave.  Extremities: Grossly normal range of motion. Warm, well perfused. No edema bilaterally.  Skin: No rashes or lesions.  Lymphatics: No cervical, supraclavicular, or inguinal adenopathy.  GU:  Normal external female genitalia. No lesions. No discharge or bleeding.             Bladder/urethra:  No lesions or masses, well supported bladder             Vagina: Mildly atrophic vaginal mucosa.  Cuff intact.  No visible lesions.  Vulvoscopy was performed first with application of 5% acetic acid and then with Lugol's.  After acetic acid applied, no areas of acetowhite noted.  There was diffuse uptake of Lugol's along the entire vagina.  All vaginal surfaces were inspected.             Cervix/uterus/adnexa: Surgically absent.  No fullness or masses appreciated on bimanual exam.  Not able to elicit pain on pelvic exam.  Rectal: Deferred.  LABORATORY AND RADIOLOGIC DATA:  Outside medical records were reviewed to synthesize the above history, along with the history and physical obtained during the visit.

## 2023-07-17 ENCOUNTER — Inpatient Hospital Stay: Payer: Medicare Other | Attending: Gynecologic Oncology | Admitting: Gynecologic Oncology

## 2023-07-17 ENCOUNTER — Encounter: Payer: Self-pay | Admitting: Gynecologic Oncology

## 2023-07-17 ENCOUNTER — Other Ambulatory Visit: Payer: Self-pay

## 2023-07-17 VITALS — BP 133/65 | HR 61 | Temp 98.2°F | Resp 20 | Ht 69.0 in | Wt 136.2 lb

## 2023-07-17 DIAGNOSIS — Z8041 Family history of malignant neoplasm of ovary: Secondary | ICD-10-CM | POA: Insufficient documentation

## 2023-07-17 DIAGNOSIS — N952 Postmenopausal atrophic vaginitis: Secondary | ICD-10-CM | POA: Diagnosis not present

## 2023-07-17 DIAGNOSIS — Z923 Personal history of irradiation: Secondary | ICD-10-CM | POA: Diagnosis not present

## 2023-07-17 DIAGNOSIS — Z800 Family history of malignant neoplasm of digestive organs: Secondary | ICD-10-CM | POA: Diagnosis not present

## 2023-07-17 DIAGNOSIS — Z79899 Other long term (current) drug therapy: Secondary | ICD-10-CM | POA: Insufficient documentation

## 2023-07-17 DIAGNOSIS — Z7982 Long term (current) use of aspirin: Secondary | ICD-10-CM | POA: Diagnosis not present

## 2023-07-17 DIAGNOSIS — R87612 Low grade squamous intraepithelial lesion on cytologic smear of cervix (LGSIL): Secondary | ICD-10-CM | POA: Insufficient documentation

## 2023-07-17 DIAGNOSIS — K219 Gastro-esophageal reflux disease without esophagitis: Secondary | ICD-10-CM | POA: Insufficient documentation

## 2023-07-17 DIAGNOSIS — Z90722 Acquired absence of ovaries, bilateral: Secondary | ICD-10-CM | POA: Insufficient documentation

## 2023-07-17 DIAGNOSIS — Z90710 Acquired absence of both cervix and uterus: Secondary | ICD-10-CM | POA: Insufficient documentation

## 2023-07-17 DIAGNOSIS — Z87410 Personal history of cervical dysplasia: Secondary | ICD-10-CM | POA: Insufficient documentation

## 2023-07-17 DIAGNOSIS — M8580 Other specified disorders of bone density and structure, unspecified site: Secondary | ICD-10-CM | POA: Diagnosis not present

## 2023-07-17 DIAGNOSIS — Z853 Personal history of malignant neoplasm of breast: Secondary | ICD-10-CM | POA: Diagnosis not present

## 2023-07-17 DIAGNOSIS — R102 Pelvic and perineal pain: Secondary | ICD-10-CM | POA: Insufficient documentation

## 2023-07-17 DIAGNOSIS — Z803 Family history of malignant neoplasm of breast: Secondary | ICD-10-CM | POA: Diagnosis not present

## 2023-07-17 MED ORDER — ESTROGENS CONJUGATED 0.625 MG/GM VA CREA: 1.0000 | TOPICAL_CREAM | VAGINAL | 1 refills | Status: DC

## 2023-07-17 NOTE — Patient Instructions (Addendum)
It was very nice to meet you today.  Your exam was very reassuring today.  I did not see any findings to warrant a biopsy.  I will recommend to your OB/GYN that at the time of your repeat Pap test in 1 year that HPV testing be done, including subtyping.  We talked about this meaning that we look at the specific high risk strain of HPV that has caused your infection.  We also discussed plan to use vaginal estrogen.  I sent a prescription to your pharmacy.  I would suggest not using the applicator but instead placing a small amount on your fingertip and inserting this into the vagina at night 3 times a week.

## 2023-07-22 ENCOUNTER — Telehealth: Payer: Self-pay | Admitting: *Deleted

## 2023-07-22 DIAGNOSIS — R87612 Low grade squamous intraepithelial lesion on cytologic smear of cervix (LGSIL): Secondary | ICD-10-CM

## 2023-07-22 DIAGNOSIS — N952 Postmenopausal atrophic vaginitis: Secondary | ICD-10-CM

## 2023-07-22 MED ORDER — ESTRADIOL 0.1 MG/GM VA CREA: TOPICAL_CREAM | VAGINAL | 1 refills | Status: DC

## 2023-07-22 NOTE — Telephone Encounter (Signed)
Spoke with Christina Edwards in regards to the burning when using the premarin cream. Relayed message from Warner Mccreedy, NP that a Rx for estrace was sent in to her pharmacy. Pt advised to follow the same instructions using the estrace 3 times a week in the evening and placing a pea size amount on finger and insert into vagina and let us know how this works. Pt verbalized understanding and will call the office with any concerns or questions.

## 2023-07-22 NOTE — Telephone Encounter (Signed)
Spoke with Christina Edwards who called the office endorsing a burning sensation since starting the estrogen vaginal cream on Saturday evening. Pt states she started the estrogen Saturday evening and then again last night for the second time. Pt is only using a small amount on her finger and inserting into vagina. Pt denies pain, fever, discharge and bleeding. Pt also denies all urinary symptoms.   Advised patient that her message would be relayed to providers and the office would call back with recommendations.

## 2023-07-29 ENCOUNTER — Telehealth: Payer: Self-pay

## 2023-07-29 NOTE — Telephone Encounter (Signed)
Patient left message on triage vm wanting to confirm that she had a hysterectomy in Aug 2021. I confirmed with the patient that she did. Patient states that she talked to gastro about belly pain and was told she should see her GYN because she had fibroids. I confirmed with patient that she does not have a uterus. Patient stated that she would call back gastro and inform them of this.  Patient has an appointment with EB on 08/06/23.

## 2023-08-05 ENCOUNTER — Other Ambulatory Visit: Payer: Self-pay | Admitting: Nurse Practitioner

## 2023-08-05 DIAGNOSIS — R109 Unspecified abdominal pain: Secondary | ICD-10-CM

## 2023-08-06 ENCOUNTER — Ambulatory Visit: Payer: Medicare Other | Admitting: Obstetrics and Gynecology

## 2023-08-07 ENCOUNTER — Ambulatory Visit
Admission: RE | Admit: 2023-08-07 | Discharge: 2023-08-07 | Disposition: A | Discharge status: Home / Self Care | Payer: Medicare Other | Source: Ambulatory Visit | Attending: Nurse Practitioner | Admitting: Nurse Practitioner

## 2023-08-07 DIAGNOSIS — R109 Unspecified abdominal pain: Secondary | ICD-10-CM

## 2023-08-28 ENCOUNTER — Encounter: Payer: Self-pay | Admitting: Obstetrics and Gynecology

## 2023-08-28 ENCOUNTER — Ambulatory Visit (INDEPENDENT_AMBULATORY_CARE_PROVIDER_SITE_OTHER): Payer: Medicare Other | Admitting: Obstetrics and Gynecology

## 2023-08-28 VITALS — BP 122/70 | HR 68 | Ht 69.0 in | Wt 136.0 lb

## 2023-08-28 DIAGNOSIS — E2839 Other primary ovarian failure: Secondary | ICD-10-CM | POA: Diagnosis not present

## 2023-08-28 DIAGNOSIS — B3731 Acute candidiasis of vulva and vagina: Secondary | ICD-10-CM | POA: Diagnosis not present

## 2023-08-28 DIAGNOSIS — R102 Pelvic and perineal pain: Secondary | ICD-10-CM | POA: Diagnosis not present

## 2023-08-28 DIAGNOSIS — N898 Other specified noninflammatory disorders of vagina: Secondary | ICD-10-CM

## 2023-08-28 DIAGNOSIS — N390 Urinary tract infection, site not specified: Secondary | ICD-10-CM

## 2023-08-28 MED ORDER — FLUCONAZOLE 150 MG PO TABS
150.0000 mg | ORAL_TABLET | Freq: Once | ORAL | 0 refills | Status: AC
Start: 2023-08-28 — End: 2023-08-28

## 2023-08-28 MED ORDER — NITROFURANTOIN MONOHYD MACRO 100 MG PO CAPS
100.0000 mg | ORAL_CAPSULE | Freq: Two times a day (BID) | ORAL | 0 refills | Status: DC
Start: 2023-08-28 — End: 2023-10-27

## 2023-08-28 NOTE — Patient Instructions (Signed)
DXA ordered today UC sent.  To notify patient if change in antibiotic is needed when it returns Macrobid sent for UTI. Reports no allergy to this To take one tablet of diflucan now and repeat after she finishes the macrobid to prevent a yeast infection. RTC with persistent/worsening s/s  Dr. Karma Greaser

## 2023-08-28 NOTE — Addendum Note (Signed)
Addended by: Earley Favor on: 08/28/2023 12:30 PM   Modules accepted: Orders

## 2023-08-28 NOTE — Progress Notes (Signed)
77 y.o. y.o. female here for vaginal burning that improved with clotrimazole/betamethasone cream and b/l lower pelvic pain. Patient seen for colposcopy for VAIN1 and persistent LGSIL with Dr. Pricilla Holm. Had severe burning likely from the vinegar after that continued. Tried premarin that was recommended and had burning.  Estrace has been picked up, but she was afraid to start it. She had an abnormal [place] that showed liver cysts. She has had tah/bso. No n/v/d or obvious dysuria.  UA with trace leuks and blood and 6-10 wbc.   Patient's last menstrual period was 09/22/2000.   G1P1L1 Married   RP:  Established patient presenting for annual gyn exam    HPI: S/P LAVH/BSO.  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  Last Pap LGSIL 02/2022.  Repeat Pap/HPV HR today.  Postmenopausal, well on no HRT.  Rare hot flushes. History of left breast cancer.  Mammogram Neg 02/2022. Colonoscopy 03/2018. DEXA 06/2021 Osteopenia was stable with AP Spine at -1.8. Health labs with Fam MD. BMI 20.53.  Physically active.  Body mass index is 20.08 kg/m.     07/15/2023    9:13 AM  Depression screen PHQ 2/9  Decreased Interest 0  Down, Depressed, Hopeless 0  PHQ - 2 Score 0    Weight 136 lb (61.7 kg), last menstrual period 09/22/2000.     Component Value Date/Time   DIAGPAP - Low grade squamous intraepithelial lesion (LSIL) (A) 06/01/2023 1228   DIAGPAP - Low grade squamous intraepithelial lesion (LSIL) (A) 11/04/2022 0953   DIAGPAP - Low grade squamous intraepithelial lesion (LSIL) (A) 02/27/2022 1018   HPVHIGH Positive (A) 11/04/2022 0953   ADEQPAP Satisfactory for evaluation. 06/01/2023 1228   ADEQPAP  11/04/2022 0953    Satisfactory for evaluation; transformation zone component ABSENT.   ADEQPAP Satisfactory for evaluation. 02/27/2022 1018    GYN HISTORY:    Component Value Date/Time   DIAGPAP - Low grade squamous intraepithelial lesion (LSIL) (A) 06/01/2023 1228   DIAGPAP - Low grade squamous  intraepithelial lesion (LSIL) (A) 11/04/2022 0953   DIAGPAP - Low grade squamous intraepithelial lesion (LSIL) (A) 02/27/2022 1018   HPVHIGH Positive (A) 11/04/2022 0953   ADEQPAP Satisfactory for evaluation. 06/01/2023 1228   ADEQPAP  11/04/2022 0953    Satisfactory for evaluation; transformation zone component ABSENT.   ADEQPAP Satisfactory for evaluation. 02/27/2022 1018    OB History  Gravida Para Term Preterm AB Living  1 1 1     1   SAB IAB Ectopic Multiple Live Births               # Outcome Date GA Lbr Len/2nd Weight Sex Type Anes PTL Lv  1 Term             Past Medical History:  Diagnosis Date   Anemia    Basal cell carcinoma    Breast cancer (HCC) 1987   Left breast intraductal; lumpectomy, RT (unsure about receptor status)   Diverticulitis    Endometriosis    diagnosed in the 80s (ovary removed on one side)   GERD (gastroesophageal reflux disease)    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/2019   LGSIL on Pap smear of cervix    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    Nocturnal leg cramps    Osteopenia 11/2018   T score -1.9 asked 9% / 1.4%   Personal history of radiation therapy    PONV (postoperative nausea and vomiting)  Past Surgical History:  Procedure Laterality Date   APPENDECTOMY  1954   BREAST LUMPECTOMY Left 1987   BREAST SURGERY  1987   Lumpectomy   CERVICAL BIOPSY  W/ LOOP ELECTRODE EXCISION     COLONOSCOPY  02/2020   CYSTOSCOPY N/A 05/15/2020   Procedure: CYSTOSCOPY;  Surgeon: Theresia Majors, MD;  Location: [facility];  Service: Gynecology;  Laterality: N/A;   EYE SURGERY     laser to fix torn retina   HAND SURGERY     left hand   LAPAROSCOPIC HYSTERECTOMY N/A 05/15/2020   Procedure: HYSTERECTOMY TOTAL LAPAROSCOPIC;  Surgeon: Theresia Majors, MD;  Location: [facility];  Service: Gynecology;  Laterality: N/A;  request 7:30 am OR time in [place] [facility]  block requests two hours   LAPAROSCOPIC SALPINGO OOPHERECTOMY Right 05/15/2020   Procedure: LAPAROSCOPIC SALPINGO OOPHORECTOMY;  Surgeon: Theresia Majors, MD;  Location: [facility];  Service: Gynecology;  Laterality: Right;   OOPHORECTOMY  1989   LSO   TONSILLECTOMY  1955   TUBAL LIGATION  1984    Current Outpatient Medications on File Prior to Visit  Medication Sig Dispense Refill   Alum & Mag Hydroxide-Simeth (MYLANTA PO) Take by mouth as needed.     aspirin EC 81 MG tablet Take 1 tablet (81 mg total) by mouth in the morning. Swallow whole.  Restart 1 week after surgery 30 tablet 11   Calcium Carbonate Antacid (TUMS PO) Take by mouth as needed.     Calcium Citrate-Vitamin D (CALCIUM + D PO) Take by mouth in the morning and at bedtime.     Cholecalciferol (VITAMIN D3 PO) Take by mouth.     Cyanocobalamin (B-12 PO) Take 1,000 mg by mouth daily.     diphenhydrAMINE HCl (BENADRYL PO) Take by mouth as needed.     estradiol (ESTRACE VAGINAL) 0.1 MG/GM vaginal cream Do not use the applicator. Place a pea-size amount on your fingertip and insert this into the vagina at night 3 times a week. 42.5 g 1   fexofenadine (ALLEGRA) 180 MG tablet Take 180 mg by mouth daily.     IBUPROFEN PO Take by mouth.     rosuvastatin (CRESTOR) 5 MG tablet TAKE 1 TABLET BY MOUTH DAILY 90 tablet 3   SALINE NASAL SPRAY NA Place into the nose.     No current facility-administered medications on file prior to visit.    Social History   Socioeconomic History   Marital status: Married    Spouse name: Not on file   Number of children: Not on file   Years of education: Not on file   Highest education level: Not on file  Occupational History   Not on file  Tobacco Use   Smoking status: Former   Smokeless tobacco: Never   Tobacco comments:    in college not much  Vaping Use   Vaping status: Never Used  Substance and Sexual Activity   Alcohol use: No    Alcohol/week: 0.0 standard drinks of  alcohol   Drug use: No   Sexual activity: Yes    Birth control/protection: Surgical    Comment: 1st intercourse 77 yo-Fewer than 5 partners, hysterectomy  Other Topics Concern   Not on file  Social History Narrative   Not on file   Social Determinants of Health   Financial Resource Strain: Not on file  Food Insecurity: No Food Insecurity (07/15/2023)   Hunger Vital Sign    Worried About Running Out  of Food in the Last Year: Never true    Ran Out of Food in the Last Year: Never true  Transportation Needs: No Transportation Needs (07/15/2023)   PRAPARE - Administrator, Civil Service (Medical): No    Lack of Transportation (Non-Medical): No  Physical Activity: Not on file  Stress: Not on file  Social Connections: Not on file  Intimate Partner Violence: Not At Risk (07/15/2023)   Humiliation, Afraid, Rape, and Kick questionnaire    Fear of Current or Ex-Partner: No    Emotionally Abused: No    Physically Abused: No    Sexually Abused: No    Family History  Problem Relation Age of Onset   Hypertension Mother    Ovarian cancer Mother        dx a month before her death, metastatic   Colon cancer Mother    Heart disease Father    Breast cancer Paternal Grandmother        Age 42's   Endometrial cancer Neg Hx    Prostate cancer Neg Hx    Pancreatic cancer Neg Hx      Allergies  Allergen Reactions   Sulfa Antibiotics Other (See Comments)    Reaction unknown   Adhesive [Tape] Rash   Latex Rash      Patient's last menstrual period was Patient's last menstrual period was 09/22/2000.Marland Kitchen          Sexually active: occasionally     Review of Systems Alls systems reviewed and are negative.     Physical Exam Constitutional:      Appearance: Normal appearance.  Genitourinary:     Vulva normal.     No lesions in the vagina.     Right Labia: No rash, lesions or skin changes.    Left Labia: No lesions, skin changes or rash.    Vaginal cuff intact.     Vaginal discharge present.     No vaginal tenderness.     No vaginal prolapse present.    No vaginal atrophy present.     Right Adnexa: not absent.    Left Adnexa: not absent.    Cervix is not absent.     Uterus is not absent. Breasts:    Right: Normal.     Left: Normal.  HENT:     Head: Normocephalic.  Neck:     Thyroid: No thyroid mass, thyromegaly or thyroid tenderness.  Cardiovascular:     Heart sounds: S1 normal and S2 normal.  Pulmonary:     Breath sounds: Normal air entry.  Abdominal:     General: Bowel sounds are normal. There is no distension.     Palpations: Abdomen is soft. There is no mass.     Tenderness: There is no abdominal tenderness. There is no guarding or rebound.  Musculoskeletal:     Cervical back: Full passive range of motion without pain.     Right lower leg: No edema.     Left lower leg: No edema.  Neurological:     Mental Status: She is alert.  Skin:    General: Skin is warm.  Psychiatric:        Mood and Affect: Mood normal.        Behavior: Behavior normal.        Thought Content: Thought content normal.  Vitals and nursing note reviewed. Exam conducted with a chaperone present.       A:         UTI,  likely yeast infection                             P:         DXA ordered today UC sent.  To notify patient if change in antibiotic is needed when it returns Macrobid sent for UTI. Reports no allergy to this To take one tablet of diflucan now and repeat after she finishes the macrobid to prevent a yeast infection. RTC with persistent/worsening s/s  No follow-ups on file.  Earley Favor

## 2023-08-29 LAB — SURESWAB® ADVANCED VAGINITIS PLUS,TMA
C. trachomatis RNA, TMA: NOT DETECTED
CANDIDA SPECIES: NOT DETECTED
Candida glabrata: NOT DETECTED
N. gonorrhoeae RNA, TMA: NOT DETECTED
SURESWAB(R) ADV BACTERIAL VAGINOSIS(BV),TMA: NEGATIVE
TRICHOMONAS VAGINALIS (TV),TMA: NOT DETECTED

## 2023-08-30 LAB — URINE CULTURE
MICRO NUMBER:: 15818803
Result:: NO GROWTH
SPECIMEN QUALITY:: ADEQUATE

## 2023-08-30 LAB — URINALYSIS, COMPLETE W/RFL CULTURE
Bilirubin Urine: NEGATIVE
Glucose, UA: NEGATIVE
Hyaline Cast: NONE SEEN /LPF
Ketones, ur: NEGATIVE
Nitrites, Initial: NEGATIVE
Protein, ur: NEGATIVE
Specific Gravity, Urine: 1.01 (ref 1.001–1.035)
pH: 5.5 (ref 5.0–8.0)

## 2023-08-30 LAB — CULTURE INDICATED

## 2023-10-16 ENCOUNTER — Telehealth: Payer: Self-pay | Admitting: *Deleted

## 2023-10-16 NOTE — Telephone Encounter (Signed)
Patient left message stating next BMD at [facility] August, requesting BMD order to [facility], [address]

## 2023-10-16 NOTE — Telephone Encounter (Signed)
I called patient & she said she wanted to have BMD done in [place]. I had called & spoke to someone from [facility] & they stated they can see her order in epic & they will call her to schedule it.

## 2023-10-26 ENCOUNTER — Other Ambulatory Visit (HOSPITAL_BASED_OUTPATIENT_CLINIC_OR_DEPARTMENT_OTHER): Payer: Self-pay

## 2023-10-26 MED ORDER — ROSUVASTATIN CALCIUM 5 MG PO TABS: 5.0000 mg | ORAL_TABLET | Freq: Every day | ORAL | 3 refills | Status: DC

## 2023-10-27 ENCOUNTER — Ambulatory Visit (INDEPENDENT_AMBULATORY_CARE_PROVIDER_SITE_OTHER): Payer: Medicare Other | Admitting: Obstetrics and Gynecology

## 2023-10-27 ENCOUNTER — Encounter: Payer: Self-pay | Admitting: Obstetrics and Gynecology

## 2023-10-27 ENCOUNTER — Other Ambulatory Visit: Payer: Self-pay

## 2023-10-27 VITALS — BP 104/60 | HR 70 | Temp 98.0°F

## 2023-10-27 DIAGNOSIS — R350 Frequency of micturition: Secondary | ICD-10-CM | POA: Diagnosis not present

## 2023-10-27 DIAGNOSIS — N390 Urinary tract infection, site not specified: Secondary | ICD-10-CM | POA: Diagnosis not present

## 2023-10-27 DIAGNOSIS — B3731 Acute candidiasis of vulva and vagina: Secondary | ICD-10-CM

## 2023-10-27 DIAGNOSIS — E2839 Other primary ovarian failure: Secondary | ICD-10-CM

## 2023-10-27 MED ORDER — DEXTROSE 5 % IV SOLN: 1.0000 g | INTRAVENOUS | Status: DC

## 2023-10-27 MED ORDER — CEFTRIAXONE SODIUM 1 G IJ SOLR: 1.0000 g | Freq: Once | INTRAMUSCULAR | 0 refills | Status: DC

## 2023-10-27 MED ORDER — FLUCONAZOLE 150 MG PO TABS: 150.0000 mg | ORAL_TABLET | Freq: Once | ORAL | 0 refills | Status: AC

## 2023-10-27 MED ORDER — CEFTRIAXONE SODIUM 1 G IJ SOLR
1.0000 g | Freq: Once | INTRAMUSCULAR | Status: AC
  Administered 2023-10-27: 1 g via INTRAMUSCULAR

## 2023-10-27 MED ORDER — FLUCONAZOLE 150 MG PO TABS
150.0000 mg | ORAL_TABLET | Freq: Once | ORAL | 0 refills | Status: DC
Start: 2023-10-27 — End: 2023-10-27

## 2023-10-27 MED ORDER — FLUCONAZOLE 150 MG PO TABS
150.0000 mg | ORAL_TABLET | Freq: Once | ORAL | 0 refills | Status: AC
Start: 2023-10-27 — End: 2023-10-27

## 2023-10-27 NOTE — Progress Notes (Signed)
   Acute Office Visit  Subjective:    Patient ID: Christina Edwards, female    DOB: May 07, 1946, 78 y.o.   MRN: [id number]   HPI 78 y.o. presents today for Urinary Tract Infection (Uti//jj/Pt c/o urinary frequency, vaginal burning, vaginal spasm) .  Patient's last menstrual period was 09/22/2000.    Review of Systems     Objective:    OBGyn Exam  BP 104/60   Pulse 70   Temp 98 F (36.7 C) (Oral)   LMP 09/22/2000 Comment: sexually active  SpO2 98%  Wt Readings from Last 3 Encounters:  08/28/23 136 lb (61.7 kg)  07/17/23 136 lb 3.2 oz (61.8 kg)  06/01/23 132 lb (59.9 kg)        Patient informed chaperone available to be present for breast and/or pelvic exam. Patient has requested no chaperone to be present. Patient has been advised what will be completed during breast and pelvic exam.   Assessment & Plan:  Rocephin  given today.  TO take diflucan  today as well for the burning and to help prevent a yeast infection.  To return with worsening or persistent s/s.  She agreed.  Almarie MARLA Carpen

## 2023-10-27 NOTE — Addendum Note (Signed)
Addended by: Eliezer Bottom on: 10/27/2023 03:35 PM   Modules accepted: Orders

## 2023-10-29 ENCOUNTER — Telehealth: Payer: Self-pay

## 2023-10-29 LAB — URINALYSIS, COMPLETE W/RFL CULTURE
Bilirubin Urine: NEGATIVE
Glucose, UA: NEGATIVE
Hgb urine dipstick: NEGATIVE
Hyaline Cast: NONE SEEN /LPF
Ketones, ur: NEGATIVE
Nitrites, Initial: NEGATIVE
Protein, ur: NEGATIVE
RBC / HPF: NONE SEEN /HPF (ref 0–2)
Specific Gravity, Urine: 1.02 (ref 1.001–1.035)
pH: 6 (ref 5.0–8.0)

## 2023-10-29 LAB — CULTURE INDICATED

## 2023-10-29 LAB — URINE CULTURE
MICRO NUMBER:: 16038748
Result:: NO GROWTH
SPECIMEN QUALITY:: ADEQUATE

## 2023-10-29 NOTE — Telephone Encounter (Signed)
 Pt LVM in triage line stating that she thought that she was only given one dose of the fluconazole  by EB on Tues. However, she received a call from the pharmacy stating that there was another refill on file for her and that it was ready for pick-up. Pt inquiring when she is supposed to take the second dose? Requests cb.

## 2023-10-29 NOTE — Telephone Encounter (Signed)
 Patient was treated with rocephin  in the office & was given rx to take for diflucan . Patients urine culture came back & was negative. Patient states she took her diflucan  & the pharmacy called her & said 2 rx's of diflucan  was called in for her that day. Patient is aware that if she continues to have symptoms of vaginal burning for her to call the office to see if she needs to take the 2nd diflucan  or come in for an appointment.  Routing to [place].

## 2023-11-02 ENCOUNTER — Ambulatory Visit (HOSPITAL_BASED_OUTPATIENT_CLINIC_OR_DEPARTMENT_OTHER)
Admission: RE | Admit: 2023-11-02 | Discharge: 2023-11-02 | Disposition: A | Discharge status: Home / Self Care | Payer: Medicare Other | Source: Ambulatory Visit | Attending: Obstetrics and Gynecology | Admitting: Obstetrics and Gynecology

## 2023-11-02 DIAGNOSIS — E2839 Other primary ovarian failure: Secondary | ICD-10-CM | POA: Diagnosis present

## 2023-11-13 NOTE — Telephone Encounter (Signed)
 Per review of EPIC, BMD completed 11/02/23.  Encounter closed.

## 2023-11-26 ENCOUNTER — Telehealth: Payer: Self-pay | Admitting: *Deleted

## 2023-11-26 ENCOUNTER — Encounter: Payer: Self-pay | Admitting: Obstetrics and Gynecology

## 2023-11-26 ENCOUNTER — Ambulatory Visit: Admitting: Obstetrics and Gynecology

## 2023-11-26 VITALS — BP 126/62 | HR 71 | Temp 97.6°F

## 2023-11-26 DIAGNOSIS — N952 Postmenopausal atrophic vaginitis: Secondary | ICD-10-CM

## 2023-11-26 DIAGNOSIS — R102 Pelvic and perineal pain: Secondary | ICD-10-CM

## 2023-11-26 DIAGNOSIS — N390 Urinary tract infection, site not specified: Secondary | ICD-10-CM

## 2023-11-26 MED ORDER — NITROFURANTOIN MONOHYD MACRO 100 MG PO CAPS: 100.0000 mg | ORAL_CAPSULE | Freq: Every day | ORAL | 0 refills | Status: DC

## 2023-11-26 MED ORDER — NITROFURANTOIN MONOHYD MACRO 100 MG PO CAPS: 100.0000 mg | ORAL_CAPSULE | Freq: Two times a day (BID) | ORAL | 0 refills | Status: DC

## 2023-11-26 MED ORDER — NITROFURANTOIN MONOHYD MACRO 100 MG PO CAPS
100.0000 mg | ORAL_CAPSULE | Freq: Every day | ORAL | 0 refills | Status: DC
Start: 2023-11-26 — End: 2023-11-26

## 2023-11-26 NOTE — Telephone Encounter (Signed)
 Spoke with patient. Patient was seen in office on 10/27/23 and treated for UTI, symptoms improved. Urine culture was negative.  Patient reports symptoms of pelvic pressure and burning sensation returned two weeks ago, worse today. Denies fever/chills, flank pain, hematuria.   Advised OV needed for further evaluation. Advised I will have to review schedule with Dr. Karma Greaser and return call. Patient states earliest she could be in the office is 10:15. Advised I will return call. Patient agreeable.

## 2023-11-26 NOTE — Addendum Note (Signed)
 Addended by: Earley Favor on: 11/26/2023 12:49 PM   Modules accepted: Orders

## 2023-11-26 NOTE — Progress Notes (Signed)
   Acute Office Visit  Subjective:    Patient ID: Christina Edwards, female    DOB: 09-26-1945, 78 y.o.   MRN: [id number]   HPI 78 y.o. presents today for Urinary Tract Infection (Uti//jj/Pt c/o pelvic pressure/fulness & vaginal burning) . Reports no dysuria but does have increased frequency. She has had several UTI's in the past 6 months. Using estrogen PV cream 3 times a week  Patient's last menstrual period was 09/22/2000.    Review of Systems     Objective:    OBGyn Exam  BP 126/62   Pulse 71   Temp 97.6 F (36.4 C) (Oral)   LMP 09/22/2000 Comment: sexually active  SpO2 98%  Wt Readings from Last 3 Encounters:  08/28/23 136 lb (61.7 kg)  07/17/23 136 lb 3.2 oz (61.8 kg)  06/01/23 132 lb (59.9 kg)       UA trace LE and 1+blood SVE: atrophic vaginitis improved with estrogen. Possible stage 1 cystocele. Normal discharge  Patient informed chaperone available to be present for breast and/or pelvic exam. Patient has requested no chaperone to be present. Patient has been advised what will be completed during breast and pelvic exam.   Assessment & Plan:  Recurrent UTI Referral placed to urogyn.  To begin course of macrobid 1 tab po bid x 7days, then begin suppression dosing 1 tab nightly until seen with urogyn. Continued use of estrogen.  Can do twice weekly for 2 weeks and then back to 3 times a week application. UC sent Earley Favor

## 2023-11-26 NOTE — Addendum Note (Signed)
 Addended by: Earley Favor on: 11/26/2023 11:53 AM   Modules accepted: Orders

## 2023-11-26 NOTE — Telephone Encounter (Signed)
 Spoke with patient. Patient is scheduled for work-in appt today at 1115.   Routing to provider for final review. Patient is agreeable to disposition. Will close encounter.

## 2023-11-28 LAB — URINALYSIS, COMPLETE W/RFL CULTURE
Bilirubin Urine: NEGATIVE
Glucose, UA: NEGATIVE
Hyaline Cast: NONE SEEN /LPF
Ketones, ur: NEGATIVE
Nitrites, Initial: NEGATIVE
Protein, ur: NEGATIVE
Specific Gravity, Urine: 1.015 (ref 1.001–1.035)
pH: 6 (ref 5.0–8.0)

## 2023-11-28 LAB — CULTURE INDICATED

## 2023-11-28 LAB — URINE CULTURE
MICRO NUMBER:: 16167877
SPECIMEN QUALITY:: ADEQUATE

## 2023-12-02 ENCOUNTER — Encounter: Payer: Self-pay | Admitting: Obstetrics and Gynecology

## 2023-12-02 ENCOUNTER — Ambulatory Visit (INDEPENDENT_AMBULATORY_CARE_PROVIDER_SITE_OTHER): Admitting: Obstetrics and Gynecology

## 2023-12-02 VITALS — BP 112/62 | HR 72 | Temp 97.6°F

## 2023-12-02 DIAGNOSIS — N9489 Other specified conditions associated with female genital organs and menstrual cycle: Secondary | ICD-10-CM

## 2023-12-02 DIAGNOSIS — N760 Acute vaginitis: Secondary | ICD-10-CM | POA: Diagnosis not present

## 2023-12-02 LAB — WET PREP FOR TRICH, YEAST, CLUE

## 2023-12-02 MED ORDER — METRONIDAZOLE 0.75 % VA GEL: 1.0000 | Freq: Every day | VAGINAL | 0 refills | Status: AC

## 2023-12-02 MED ORDER — IMVEXXY MAINTENANCE PACK 4 MCG VA INST: 1.0000 | VAGINAL_INSERT | Freq: Every day | VAGINAL | 6 refills | Status: DC

## 2023-12-02 MED ORDER — IMVEXXY MAINTENANCE PACK 10 MCG VA INST: 1.0000 | VAGINAL_INSERT | Freq: Every evening | VAGINAL | 12 refills | Status: DC

## 2023-12-02 NOTE — Progress Notes (Signed)
   Acute Office Visit  Subjective:    Patient ID: Christina Edwards, female    DOB: [birth year], 78 y.o.   MRN: [id number]   HPI 78 y.o. presents today for pain (Pt c/o pain//jj/Pt c/o vaginal burning) . Burning worse after using the estrogen cream. No pruritus Taking macrobid for a UTI now.  Patient's last menstrual period was 09/22/2000.    Review of Systems     Objective:    OBGyn Exam  BP 112/62   Pulse 72   Temp 97.6 F (36.4 C) (Oral)   LMP 09/22/2000 Comment: sexually active  SpO2 98%  Wt Readings from Last 3 Encounters:  08/28/23 136 lb (61.7 kg)  07/17/23 136 lb 3.2 oz (61.8 kg)  06/01/23 132 lb (59.9 kg)   SVE: no lesions, discharge c/w BV    Wet mount with BV  Patient informed chaperone available to be present for breast and/or pelvic exam. Patient has requested no chaperone to be present. Patient has been advised what will be completed during breast and pelvic exam.   Assessment & Plan:  BV, recurrent UTI, vaginal burning Metrogel sent for 7 days at bedtime Stop vaginal estrogen cream Begin imvexxy after finishing the metrogel at bedtime for bladder support and for atrophic vaginitis Begin nightly UTI suppression with macrobid once finishing the 7day course of macrobid twice daily  Earley Favor

## 2023-12-03 ENCOUNTER — Other Ambulatory Visit: Payer: Self-pay | Admitting: *Deleted

## 2023-12-03 NOTE — Telephone Encounter (Signed)
 Prior authorization submitted to cover my meds for Imvexxy Key: [id number] Patient aware.

## 2023-12-03 NOTE — Telephone Encounter (Signed)
 Call returned to patient.   Patient states Metrogel prescribed for 7 days, only has 5 applicators. Per review of OV note and Rx, prescribed for 7 days.  Advised she can reach back out to the pharmacy to purchase additional applicators or can wash and reuse applicator.    2. Patient is requesting PA for Imvexxy.  Patient has tried and failed estradiol vaginal cream and Premarin. Burning with use.  Dx: Atrophic Vaginitis Advised patient can take 7 business days for response, our office will notify.   Routing to [employer] for PA.

## 2023-12-04 ENCOUNTER — Telehealth: Payer: Self-pay

## 2023-12-04 MED ORDER — IMVEXXY MAINTENANCE PACK 4 MCG VA INST: 1.0000 | VAGINAL_INSERT | VAGINAL | 6 refills | Status: DC

## 2023-12-04 NOTE — Telephone Encounter (Signed)
 Patient left message on triage line. Patient spoke with [employer] this morning. Imvexxy only covered for 8 capsules for 28 days, $13 per capsule, $104.   Patient asking if there is another option or if twice weekly will be effective?   Scheduled with Urogyn 03/21/24.   Dr. Karma Greaser -please advise.

## 2023-12-04 NOTE — Telephone Encounter (Signed)
 Pt LVM in triage line inquiring for a cb regarding new medication to start tonight.  Spoke w/ the pt and she wanted to confirm that since she completed the BI macrobid, for her to continue with it once nightly today prior to seeing the Urogyn?  Recommendations confirmed for the pt and all questions answered. Pt voiced understanding and appreciation for the cb.  Encounter closed.

## 2023-12-04 NOTE — Telephone Encounter (Signed)
 Spoke with patient, advised per Dr. Karma Greaser.   Patient asking if there are covered alteratives? Advised patient to contact her insurance company to review covered alternatives. Once she has received a list, return call to office to advise and we can review with Dr. Karma Greaser. Patient will f/u on Monday. Advised will send updated Rx request to Dr. Karma Greaser to review and sign.   Rx pended. See contraindications.

## 2023-12-10 ENCOUNTER — Telehealth: Payer: Self-pay

## 2023-12-10 NOTE — Telephone Encounter (Signed)
 Pt LVM in triage line stating that she finished the metrogel and is still having burning in that area. Also reported in msg that she had additional questions regarding macrobid and estrogen tablets.   Spoke w/ the pt and she reports that she is still having the burning and feels as if it could be both urinary and vulvar/vaginal. Has already gone to RR 3-4x this AM without really having a full bladder but is pushing fluids as much as she can to flush. Has completed the BID macrobid and is now taking the nightly dosing only. Has completed the metrogel and just wanted to update you on sxs.  Pt also wanted to confirm that she is supposed to use the imvexxy at bedtime x2 weeks and then taper to the twice weekly. (Confirmed for pt).  Pt advised that provider may want her to continue monitoring after using imvexxy for a bit to see if that provides her with any relief of her symptoms before contacting in suspicion that mgmt is not working. Pt voiced understanding.  Pt also wanted to inform you that she could not get into UroGYN until the end of June and wanted to see if you had any feedback on that.   Please advise on all. Thanks in advance.

## 2023-12-11 ENCOUNTER — Other Ambulatory Visit: Payer: Self-pay

## 2023-12-11 MED ORDER — IMVEXXY STARTER PACK 10 MCG VA INST: 1.0000 | VAGINAL_INSERT | Freq: Every evening | VAGINAL | 6 refills | Status: AC

## 2023-12-11 NOTE — Telephone Encounter (Signed)
 Spoke to pt via phone call. Confirmed pt will take Imvexxy every night for 14 days. Then switch to twice a week at night. Sending in Imvexxy starter 

## 2023-12-11 NOTE — Telephone Encounter (Signed)
 Pharmacist Ty called for clarification. Insurance will not cover and being prescribed at the same time. Pt will now received Imvexxy for 16 doses. Will only take 14 days worth. After pt finishes , will call our office for a fill of Imvexxy to take twice weekly so that her insurance will cover. Spoke to pt, she is agreeable. Pharmacist has altered scripts and dosage to confirm this

## 2023-12-11 NOTE — Telephone Encounter (Signed)
 Incoming call received from pt's pharmacy desiring to confirm the two different dosings of the imvexxy and the sig for both and also if they are to be used consecutively or concurrently.  Please confirm if you want the pt to only use the for two weeks nightly and then use twice weekly or once done with the 2wks nightly with the , then start the twice weekly. Thanks.

## 2023-12-16 NOTE — Telephone Encounter (Signed)
 Patient called regaurding Imvexxy. She states that the medication warning says that it could cause breast cancer and memory problems. She states that she already has had breast cancer and that she currently has memory issues. She is wanting to be reassured that this medication is safe for her to take. She can be reached at [phone]. Please advise.

## 2023-12-17 ENCOUNTER — Encounter: Payer: Self-pay | Admitting: Obstetrics and Gynecology

## 2023-12-17 NOTE — Telephone Encounter (Signed)
 Pt LVM in triage line stating that she is doing better today but regarding starting the imvexxy tonight and that she isn't sure but will try and see if the imvexxy will help w/ her vaginal/vulvar or possible bladder pain/burning.   Spoke w/ the pt and advised her about Eb's response to her inquiry from yesterday and voiced understanding. States she will try the coconut oil in the meantime and will try to gain access to those records from [facility] asap. However, was more than 20 years ago and she is unsure about HER2 status at the time of the call.  Do you think the coconut oil should help w/ sxs listed above? TIA.

## 2023-12-18 NOTE — Telephone Encounter (Signed)
 It would not hurt Dr. Karma Greaser  Pt notified and voiced understanding. Will contact us sometime next week to provide [place] with update if the coconut oil is working, helping with sxs.   Routing to provider for final review and encounter closed.

## 2024-01-01 NOTE — Telephone Encounter (Signed)
 When I last spoke w/ the pt, she said since it was in the 1900s when had br ca, she will do all she can to find her records to find out if br ca was HER+.  However, your pt mychart msg returned unread (pt not active on mychart), she has likely started with imvexxy. Pls advise if recommendation would be changed at all given the time of notification and then we can call her. Thanks

## 2024-01-18 NOTE — Progress Notes (Unsigned)
 New Patient Evaluation and Consultation  Referring Provider: Reinaldo Caras, MD PCP: Faustina Hood, MD Date of Service: 01/19/2024  SUBJECTIVE Chief Complaint: No chief complaint on file.  History of Present Illness: Christina Edwards is a 78 y.o. White or Caucasian female seen in consultation at the request of Dr Tia Flowers for evaluation of pelvic pressure and recurrent UTI.    Stage I cystocele with vaginal atrophy noted on exam, started macrobid  for presumed UTI on 11/26/23. UA + leuk/heme, culture with < 10K cfu. Reports worsening vaginal burning after starting premarin . Wet prep positive for bacterial vaginosis and started on Metrogel  and continued Imvexxy .  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  Pad use: {NUMBERS 1-10:18281} {pad option:24752} per day.   Patient {ACTION; IS/IS YNW:29562130} bothered by UI symptoms.  Day time voids ***.  Nocturia: *** times per night to void. Voiding dysfunction:  {empties:24755} bladder well.  Patient {DOES NOT does:27190::"does not"} use a catheter to empty bladder.  When urinating, patient feels {urine symptoms:24756} Drinks: *** per day  UTIs: {NUMBERS 1-10:18281} UTI's in the last year.   {ACTIONS;DENIES/REPORTS:21021675::"Denies"} history of {urologic concerns:24757} No results found for the last 90 days.   Pelvic Organ Prolapse Symptoms:                  Patient {denies/ admits to:24761} a feeling of a bulge the vaginal area. It has been present for {NUMBER 1-10:22536} {days/wks/mos/yrs:310907}.  Patient {denies/ admits to:24761} seeing a bulge.  This bulge {ACTION; IS/IS QMV:78469629} bothersome.  Bowel Symptom: Bowel movements: *** time(s) per {Time;  day/week/month:13537} with history of diverticulitis  Stool consistency: {stool consistency:24758} Straining: {yes/no:19897}.  Splinting: {yes/no:19897}.  Incomplete evacuation: {yes/no:19897}.  Patient {denies/ admits to:24761} accidental bowel leakage / fecal incontinence  Occurs: *** time(s) per {Time; day/week/month:13537}  Consistency with leakage: {stool consistency:24758} Bowel regimen: {bowel regimen:24759} Last colonoscopy: Date ***, Results *** [facility] Colonoscopy          Completed or No Longer Recommended     Colonoscopy  Discontinued      Frequency changed to Never automatically (Topic No Longer Applies)   03/29/2018  [facility] COLONOSCOPY   Only the first 1 history entries have been loaded, but more history exists.                Sexual Function Sexually active: {yes/no:19897}.  Sexual orientation: {Sexual Orientation:9280357137} Pain with sex: {pain with sex:24762}  Pelvic Pain {denies/ admits to:24761} pelvic pain Location: *** Pain occurs: *** Prior pain treatment: *** Improved by: *** Worsened by: ***   Past Medical History:  Past Medical History:  Diagnosis Date   Anemia    Basal cell carcinoma    Breast cancer (HCC) 1987   Left breast intraductal; lumpectomy, RT (unsure about receptor status)   Diverticulitis    Endometriosis    diagnosed in the 80s (ovary removed on one side)   GERD (gastroesophageal reflux disease)    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/2019   LGSIL on Pap smear of cervix    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    Nocturnal leg cramps    Osteopenia 11/2018   T score -1.9 asked 9% / 1.4%   Personal history of radiation  therapy    PONV (postoperative nausea and vomiting)      Past Surgical History:   Past Surgical History:  Procedure Laterality Date   APPENDECTOMY  1954   BREAST LUMPECTOMY Left 1987   BREAST SURGERY  1987   Lumpectomy   CERVICAL BIOPSY  W/ LOOP  ELECTRODE EXCISION     COLONOSCOPY  02/2020   CYSTOSCOPY N/A 05/15/2020   Procedure: CYSTOSCOPY;  Surgeon: Mallory Seaman, MD;  Location: [facility];  Service: Gynecology;  Laterality: N/A;   EYE SURGERY     laser to fix torn retina   HAND SURGERY     left hand   LAPAROSCOPIC HYSTERECTOMY N/A 05/15/2020   Procedure: HYSTERECTOMY TOTAL LAPAROSCOPIC;  Surgeon: Mallory Seaman, MD;  Location: [facility];  Service: Gynecology;  Laterality: N/A;  request 7:30 am OR time in [place] [facility] block requests two hours   LAPAROSCOPIC SALPINGO OOPHERECTOMY Right 05/15/2020   Procedure: LAPAROSCOPIC SALPINGO OOPHORECTOMY;  Surgeon: Mallory Seaman, MD;  Location: [facility];  Service: Gynecology;  Laterality: Right;   OOPHORECTOMY  1989   LSO   TONSILLECTOMY  1955   TUBAL LIGATION  1984     Past OB/GYN History: OB History  Gravida Para Term Preterm AB Living  1 1 1   1   SAB IAB Ectopic Multiple Live Births          # Outcome Date GA Lbr Len/2nd Weight Sex Type Anes PTL Lv  1 Term             Vaginal deliveries: ***,  Forceps/ Vacuum deliveries: ***, Cesarean section: *** Menopausal: {menopausal:24763} Contraception: ***. Last pap smear was ***.  Any history of abnormal pap smears: {yes/no:19897}.    Component Value Date/Time   DIAGPAP - Low grade squamous intraepithelial lesion (LSIL) (A) 06/01/2023 1228   DIAGPAP - Low grade squamous intraepithelial lesion (LSIL) (A) 11/04/2022 0953   DIAGPAP - Low grade squamous intraepithelial lesion (LSIL) (A) 02/27/2022 1018   HPVHIGH Positive (A) 11/04/2022 0953   ADEQPAP Satisfactory for evaluation. 06/01/2023 1228   ADEQPAP  11/04/2022 0953    Satisfactory for evaluation; transformation zone component ABSENT.   ADEQPAP Satisfactory for evaluation. 02/27/2022 1018    Medications: Patient has a current medication list which includes the following prescription(s): calcium  & magnesium carbonates,  aspirin  ec, calcium  carbonate antacid, calcium -vitamin d-vitamin k, cholecalciferol, cyanocobalamin, diphenhydramine hcl, imvexxy  maintenance pack, fexofenadine, ibuprofen , nitrofurantoin  (macrocrystal-monohydrate), nitrofurantoin  (macrocrystal-monohydrate), rosuvastatin , saline, and [DISCONTINUED] estradiol .   Allergies: Patient is allergic to conjugated estrogens , sulfa antibiotics, adhesive [tape], and latex.   Social History:  Social History   Tobacco Use   Smoking status: Former   Smokeless tobacco: Never   Tobacco comments:    in college not much  Vaping Use   Vaping status: Never Used  Substance Use Topics   Alcohol use: No    Alcohol/week: 0.0 standard drinks of alcohol   Drug use: No    Relationship status: {relationship status:24764} Patient lives with ***.   Patient {ACTION; IS/IS IEP:32951884} employed ***. Regular exercise: {Yes/No:304960894} History of abuse: {Yes/No:304960894}  Family History:   Family History  Problem Relation Age of Onset   Hypertension Mother    Ovarian cancer Mother        dx a month before her death, metastatic   Colon cancer Mother    Heart disease Father    Breast cancer Paternal Grandmother        Age 59's   Endometrial cancer  Neg Hx    Prostate cancer Neg Hx    Pancreatic cancer Neg Hx      Review of Systems: ROS   OBJECTIVE Physical Exam: There were no vitals filed for this visit.  Physical Exam   GU / Detailed Urogynecologic Evaluation:  Pelvic Exam: Normal external female genitalia; Bartholin's and Skene's glands normal in appearance; urethral meatus normal in appearance, no urethral masses or discharge.   CST: {gen negative/positive:315881}  Reflexes: bulbocavernosis {DESC; PRESENT/NOT PRESENT:21021351}, anocutaneous {DESC; PRESENT/NOT PRESENT:21021351} ***bilaterally.  Speculum exam reveals normal vaginal mucosa {With/Without:20273} atrophy. Cervix {exam; gyn cervix:30847}. Uterus {exam; pelvic uterus:30849}.  Adnexa {exam; adnexa:12223}.    s/p hysterectomy: Speculum exam reveals normal vaginal mucosa {With/Without:20273}  atrophy and normal vaginal cuff.  Adnexa {exam; adnexa:12223}.    With apex supported, anterior compartment defect was {reduced:24765}  Pelvic floor strength {Roman # I-V:19040}/V, puborectalis {Roman # I-V:19040}/V external anal sphincter {Roman # I-V:19040}/V  Pelvic floor musculature: Right levator {Tender/Non-tender:20250}, Right obturator {Tender/Non-tender:20250}, Left levator {Tender/Non-tender:20250}, Left obturator {Tender/Non-tender:20250}  POP-Q:   POP-Q                                               Aa                                               Ba                                                 C                                                Gh                                               Pb                                               tvl                                                Ap                                               Bp  D      Rectal Exam:  Normal sphincter tone, {rectocele:24766} distal rectocele, enterocoele {DESC; PRESENT/NOT PRESENT:21021351}, no rectal masses, {sign of:24767} dyssynergia when asking the patient to bear down.  Post-Void Residual (PVR) by Bladder Scan: In order to evaluate bladder emptying, we discussed obtaining a postvoid residual and patient agreed to this procedure.  Procedure: The ultrasound unit was placed on the patient's abdomen in the suprapubic region after the patient had voided.      Laboratory Results: Lab Results  Component Value Date   BILIRUBINUR NEG 09/06/2014   PROTEINUR NEGATIVE 11/26/2023   UROBILINOGEN 0.2 09/06/2014   LEUKOCYTESUR NEG 09/06/2014    Lab Results  Component Value Date   CREATININE 0.78 02/04/2019   CREATININE 0.85 05/27/2011    No results found for: "HGBA1C"  Lab Results  Component Value Date   HGB  12.6 05/15/2020     ASSESSMENT AND PLAN Ms. Hollinger is a 78 y.o. with: No diagnosis found.  There are no diagnoses linked to this encounter.   Darlene Ehlers, MD

## 2024-01-19 ENCOUNTER — Encounter: Payer: Self-pay | Admitting: Obstetrics

## 2024-01-19 ENCOUNTER — Other Ambulatory Visit (HOSPITAL_COMMUNITY)
Admission: RE | Admit: 2024-01-19 | Discharge: 2024-01-19 | Disposition: A | Discharge status: Home / Self Care | Source: Other Acute Inpatient Hospital | Attending: Obstetrics | Admitting: Obstetrics

## 2024-01-19 ENCOUNTER — Ambulatory Visit (INDEPENDENT_AMBULATORY_CARE_PROVIDER_SITE_OTHER): Admitting: Obstetrics

## 2024-01-19 VITALS — BP 129/57 | HR 66 | Ht 68.0 in | Wt 134.0 lb

## 2024-01-19 DIAGNOSIS — R319 Hematuria, unspecified: Secondary | ICD-10-CM

## 2024-01-19 DIAGNOSIS — N393 Stress incontinence (female) (male): Secondary | ICD-10-CM | POA: Insufficient documentation

## 2024-01-19 DIAGNOSIS — R3914 Feeling of incomplete bladder emptying: Secondary | ICD-10-CM | POA: Diagnosis not present

## 2024-01-19 DIAGNOSIS — K5900 Constipation, unspecified: Secondary | ICD-10-CM | POA: Insufficient documentation

## 2024-01-19 DIAGNOSIS — N816 Rectocele: Secondary | ICD-10-CM | POA: Insufficient documentation

## 2024-01-19 DIAGNOSIS — N952 Postmenopausal atrophic vaginitis: Secondary | ICD-10-CM | POA: Diagnosis not present

## 2024-01-19 DIAGNOSIS — R8290 Unspecified abnormal findings in urine: Secondary | ICD-10-CM | POA: Insufficient documentation

## 2024-01-19 DIAGNOSIS — N94819 Vulvodynia, unspecified: Secondary | ICD-10-CM | POA: Insufficient documentation

## 2024-01-19 LAB — POCT URINALYSIS DIPSTICK
Bilirubin, UA: NEGATIVE
Glucose, UA: NEGATIVE
Ketones, UA: NEGATIVE
Leukocytes, UA: NEGATIVE
Nitrite, UA: NEGATIVE
Protein, UA: NEGATIVE
Spec Grav, UA: <=1.005 — AB (ref 1.010–1.025)
Urobilinogen, UA: 0.2 U/dL
pH, UA: 5.5 (ref 5.0–8.0)

## 2024-01-19 LAB — URINALYSIS, ROUTINE W REFLEX MICROSCOPIC
Bilirubin Urine: NEGATIVE
Glucose, UA: NEGATIVE mg/dL
Ketones, ur: NEGATIVE mg/dL
Leukocytes,Ua: NEGATIVE
Nitrite: NEGATIVE
Protein, ur: NEGATIVE mg/dL
Specific Gravity, Urine: 1.013 (ref 1.005–1.030)
pH: 6 (ref 5.0–8.0)

## 2024-01-19 MED ORDER — LIDOCAINE 5 % EX OINT: TOPICAL_OINTMENT | CUTANEOUS | 0 refills | Status: AC

## 2024-01-19 NOTE — Assessment & Plan Note (Addendum)
-   UA + heme, pending UA microscopy and culture from catheterized sample - For treatment of stress urinary incontinence,  non-surgical options include expectant management, weight loss, physical therapy, as well as a pessary.  Surgical options include a midurethral sling, Burch urethropexy, and transurethral injection of a bulking agent. - start low dose vaginal estrogen - referral sent to pelvic floor PT

## 2024-01-19 NOTE — Assessment & Plan Note (Signed)
-   asymptomatic - For treatment of pelvic organ prolapse, we discussed options for management including expectant management, conservative management, and surgical management, such as Kegels, a pessary, pelvic floor physical therapy, and specific surgical procedures. - referral to pelvic floor PT - start low dose vaginal estrogen

## 2024-01-19 NOTE — Assessment & Plan Note (Signed)
-   catheterized for 40mL - pelvic floor myofascial pain with constipation, encouraged optimizing stool consistency and referral to pelvic floor PT for relaxation

## 2024-01-19 NOTE — Assessment & Plan Note (Signed)
-   clean catch UA + heme, pending UA microscopy with catheterized sample - likely due to vaginal atrophy, start Imvexxy  - discussed cystoscopy if refractory symptoms - avoid treatment of UTI in the absence of UTI symptoms

## 2024-01-19 NOTE — Patient Instructions (Addendum)
 For vaginal atrophy (thinning of the vaginal tissue that can cause dryness and burning) and UTI prevention we discussed estrogen replacement in the form of vaginal cream.   Start vaginal estrogen therapy nightly for two weeks then 2 times weekly at night. This can be placed with your finger or an applicator inside the vagina and around the urethra.  Please let us  know if the prescription is too expensive and we can look for alternative options.   Is vaginal estrogen therapy safe for me? Vaginal estrogen preparations act on the vaginal skin, and only a very tiny amount is absorbed into the bloodstream (0.01%).  They work in a similar way to hand or face cream.  There is minimal absorption and they are therefore perfectly safe. If you have had breast cancer and have persistent troublesome symptoms which aren't settling with vaginal moisturisers and lubricants, local estrogen treatment may be a possibility, but consultation with your oncologist should take place first.   - discussed proper vulvar care, warm compression, avoid pad use, cotton only underwear and barrier ointment if needed   For treatment of stress urinary incontinence, which is leakage with physical activity/movement/strainging/coughing, we discussed expectant management versus nonsurgical options versus surgery. Nonsurgical options include weight loss, physical therapy, as well as a pessary.  Surgical options include a midurethral sling, which is a synthetic mesh sling that acts like a hammock under the urethra to prevent leakage of urine, a Burch urethropexy, and transurethral injection of a bulking agent.   - provided handout regarding vulvodynia, reviewed comfort measures and treatment options.  - Rx topical lidocaine  1g PRN pain up to 3x/day - we can consider Rx Amitriptyline 2.5%/ gabapentin 2.5%/ baclofen 2.5% in vaginal cream. Will send to compounding pharmacy for use daily if no relief from lidocaine  - discussed conservative  management options with cold compress after intercourse  - lubrication use during intercourse  The origin of pelvic floor muscle spasm can be multifactorial, including primary, reactive to a different pain source, trauma, or even part of a centralized pain syndrome.Treatment options include pelvic floor physical therapy, local (vaginal) or oral  muscle relaxants, pelvic muscle trigger point injections or centrally acting pain medications.   Please call [phone] to schedule the earliest appointment for pelvic floor PT.  For constipation, we reviewed the importance of a better bowel regimen.  We also discussed the importance of avoiding chronic straining, as it can exacerbate her pelvic floor symptoms; we discussed treating constipation and straining prior to surgery, as postoperative straining can lead to damage to the repair and recurrence of symptoms. We discussed initiating therapy with increasing fluid intake, fiber supplementation, stool softeners, and laxatives such as miralax.   You have a stage 2 (out of 4) prolapse.  We discussed the fact that it is not life threatening but there are several treatment options. For treatment of pelvic organ prolapse, we discussed options for management including expectant management, conservative management, and surgical management, such as Kegels, a pessary, pelvic floor physical therapy, and specific surgical procedures.

## 2024-01-19 NOTE — Assessment & Plan Note (Addendum)
-   For symptomatic vaginal atrophy options include lubrication with a water-based lubricant, personal hygiene measures and barrier protection against wetness, and estrogen replacement in the form of vaginal cream, vaginal tablets, or a time-released vaginal ring.   - We discussed the potential risks associated with hormone replacement including stroke, heart attack, and blood clots; and the fact that these risks are very low with vaginal estrogen use due to the very low systemic absorption rate of ~ 0.01% with a twice-week regimen. - reassured patient that low dose vaginal estrogen can be used in patients with a history of breast cancer without increased risk of cancer recurrence.  - encouraged moisturizer use, lubrication, Vitamin E/D vaginal suppositories - discussed proper vulvar care, warm compression, avoid pad use, cotton only underwear and barrier ointment if needed  - start Imvexxy

## 2024-01-19 NOTE — Assessment & Plan Note (Addendum)
-   Bristol III-IV stool with intermittent type I stool 2-3x/week - history of diverticulitis - For constipation, we reviewed the importance of a better bowel regimen.  We also discussed the importance of avoiding chronic straining, as it can exacerbate her pelvic floor symptoms; we discussed treating constipation and straining prior to surgery, as postoperative straining can lead to damage to the repair and recurrence of symptoms. We discussed initiating therapy with increasing fluid intake, fiber supplementation, stool softeners, and laxatives such as miralax.  - encouraged fiber supplementation to optimize stool consistency and squatting position for bowel movements. - discussed association with pelvic floor disorders

## 2024-01-19 NOTE — Assessment & Plan Note (Addendum)
-   provided handout regarding vulvodynia, reviewed comfort measures and treatment options.  - Rx topical lidocaine  1g PRN pain up to 3x/day if no relief after Imvexxy  for 4-6 weeks, test with small amount to r/o burning prior to use - consider Rx Amitriptyline 2.5%/ gabapentin 2.5%/ baclofen 2.5% in vaginal cream. Sent to compounding pharmacy for use daily - discussed conservative management options with cold compress after intercourse  - lubrication use during intercourse - referral sent for pelvic floor PT appointment - discussed proper vulvar care, warm compression, avoid pad use, cotton only underwear and barrier ointment if needed  - The origin of pelvic floor muscle spasm can be multifactorial, including primary, reactive to a different pain source, trauma, or even part of a centralized pain syndrome.Treatment options include pelvic floor physical therapy, local (vaginal) or oral  muscle relaxants, pelvic muscle trigger point injections or centrally acting pain medications.

## 2024-01-20 ENCOUNTER — Telehealth: Payer: Self-pay

## 2024-01-20 LAB — URINE CULTURE: Culture: NO GROWTH

## 2024-01-20 NOTE — Telephone Encounter (Signed)
 Patient called today to review medication instructions, after reviewing this information patient had no additional questions.

## 2024-01-27 ENCOUNTER — Telehealth: Payer: Self-pay

## 2024-01-27 DIAGNOSIS — N9489 Other specified conditions associated with female genital organs and menstrual cycle: Secondary | ICD-10-CM

## 2024-01-27 NOTE — Telephone Encounter (Signed)
 Patient called & left a message on triage voicemail stating she was returning a call to talk to [facility].

## 2024-01-28 MED ORDER — IMVEXXY MAINTENANCE PACK 4 MCG VA INST: 1.0000 | VAGINAL_INSERT | VAGINAL | 1 refills | Status: DC

## 2024-01-28 NOTE — Telephone Encounter (Signed)
 Per EB: "Let's get her a referral to PT and follow Dr. Kelle Pate recommendations Dr. Tia Flowers"  Information provided to the pt from Dr. Aron Lard and website pages were printed out for pt education and left at front desk for her. Pt voiced understanding and appreciation for the assistance today.  Rx for maintenance dosing of imvexxy  sent to alternate pharmacy per pt's request.   Encounter closed.

## 2024-01-28 NOTE — Telephone Encounter (Signed)
 Returned call to pt and as we spoke briefly, she reported that she was walking in the office door to chat in person.   Pt wanted to let us  know that she has seen Dr. Aron Lard. Dr. Aron Lard prescribed meds and sent referral to PFT for her but not until 06/30 and is anxious to try and get in sooner. Pt advised to contact the PFT office to inquire if she can be placed on a cancellation list for any openings that come up sooner than currently scheduled appt. Pt voiced understanding/appreciation. She also inquired about if there were any exercises she could do in the meantime to be of any help. Pt advised that kegels were all I was aware of and explained the exercise to her. Alternate msg sent to urogyn to inquire if any additional exercises could be recommended.   Pt also wanted to let you know that unfortunately since her records from [facility]/appts were so long ago that they cannot be located and cannot determine her HER/HR status. However, reported that she was advised by Dr. Aron Lard to continue using the imvexxy  since local and not systemic.   Pt reported starting the initial pack of 8mcg of inmvexxy on 5/1 and will plan to finish on 5/14. Reported that she has noticed some relief in her sxs but are still intermittent and wants to also get the confirmation from Dr. Tia Flowers if she feels ok to continue initial dosing and start maintenance when finished?   Pt also mentioned that if she gets the go ahead to continue imvexxy , would like the maintenance dosing pharmacy changed  Please advise.

## 2024-02-02 ENCOUNTER — Other Ambulatory Visit: Payer: Self-pay | Admitting: Family Medicine

## 2024-02-02 DIAGNOSIS — Z1231 Encounter for screening mammogram for malignant neoplasm of breast: Secondary | ICD-10-CM

## 2024-03-04 ENCOUNTER — Ambulatory Visit
Admission: RE | Admit: 2024-03-04 | Discharge: 2024-03-04 | Disposition: A | Discharge status: Home / Self Care | Source: Ambulatory Visit | Attending: Family Medicine | Admitting: Family Medicine

## 2024-03-04 ENCOUNTER — Ambulatory Visit

## 2024-03-04 DIAGNOSIS — Z1231 Encounter for screening mammogram for malignant neoplasm of breast: Secondary | ICD-10-CM

## 2024-03-09 ENCOUNTER — Ambulatory Visit: Payer: Self-pay | Admitting: Obstetrics and Gynecology

## 2024-03-21 ENCOUNTER — Ambulatory Visit

## 2024-03-21 ENCOUNTER — Ambulatory Visit: Admitting: Obstetrics

## 2024-04-05 ENCOUNTER — Other Ambulatory Visit: Payer: Self-pay | Admitting: Obstetrics and Gynecology

## 2024-04-05 DIAGNOSIS — N9489 Other specified conditions associated with female genital organs and menstrual cycle: Secondary | ICD-10-CM

## 2024-04-05 NOTE — Telephone Encounter (Signed)
.  Med refill request: Imvexxy  Last AEX: 06/01/23 Next AEX:06/01/24 Last OV: 12/02/23 Last MMG (if hormonal med) 03/04/24 Bi-rads cat. 1- Neg Refill authorized: Please Advise?

## 2024-04-19 ENCOUNTER — Other Ambulatory Visit (HOSPITAL_COMMUNITY)
Admission: RE | Admit: 2024-04-19 | Discharge: 2024-04-19 | Disposition: A | Discharge status: Home / Self Care | Attending: Obstetrics | Admitting: Obstetrics

## 2024-04-19 ENCOUNTER — Ambulatory Visit: Payer: Self-pay | Admitting: Obstetrics

## 2024-04-19 ENCOUNTER — Ambulatory Visit (INDEPENDENT_AMBULATORY_CARE_PROVIDER_SITE_OTHER): Admitting: Obstetrics

## 2024-04-19 ENCOUNTER — Encounter: Payer: Self-pay | Admitting: Obstetrics

## 2024-04-19 VITALS — BP 131/75 | HR 64

## 2024-04-19 DIAGNOSIS — R319 Hematuria, unspecified: Secondary | ICD-10-CM

## 2024-04-19 DIAGNOSIS — N941 Unspecified dyspareunia: Secondary | ICD-10-CM | POA: Insufficient documentation

## 2024-04-19 DIAGNOSIS — K5900 Constipation, unspecified: Secondary | ICD-10-CM | POA: Diagnosis not present

## 2024-04-19 DIAGNOSIS — N952 Postmenopausal atrophic vaginitis: Secondary | ICD-10-CM | POA: Diagnosis not present

## 2024-04-19 DIAGNOSIS — N393 Stress incontinence (female) (male): Secondary | ICD-10-CM | POA: Diagnosis not present

## 2024-04-19 DIAGNOSIS — R8290 Unspecified abnormal findings in urine: Secondary | ICD-10-CM

## 2024-04-19 LAB — POCT URINALYSIS DIP (CLINITEK)
Bilirubin, UA: NEGATIVE
Glucose, UA: NEGATIVE mg/dL
Ketones, POC UA: NEGATIVE mg/dL
Leukocytes, UA: NEGATIVE
Nitrite, UA: NEGATIVE
POC PROTEIN,UA: NEGATIVE
Spec Grav, UA: 1.02 (ref 1.010–1.025)
Urobilinogen, UA: 0.2 U/dL
pH, UA: 5.5 (ref 5.0–8.0)

## 2024-04-19 LAB — URINALYSIS, ROUTINE W REFLEX MICROSCOPIC
Bilirubin Urine: NEGATIVE
Glucose, UA: NEGATIVE mg/dL
Ketones, ur: NEGATIVE mg/dL
Leukocytes,Ua: NEGATIVE
Nitrite: NEGATIVE
Protein, ur: NEGATIVE mg/dL
Specific Gravity, Urine: 1.019 (ref 1.005–1.030)
pH: 5 (ref 5.0–8.0)

## 2024-04-19 MED ORDER — ESTRADIOL 0.1 MG/GM VA CREA: TOPICAL_CREAM | VAGINAL | 3 refills | Status: AC

## 2024-04-19 MED ORDER — ESTRADIOL 0.1 MG/GM VA CREA: TOPICAL_CREAM | VAGINAL | 3 refills | Status: DC

## 2024-04-19 NOTE — Assessment & Plan Note (Signed)
-   01/19/24 clean catch UA + heme, UA microscopy with catheterized sample 0-5 RBC/hpf - started Imvexxy  - repeat urine test after resolution of bleeding - discussed cystoscopy if refractory symptoms - avoid treatment of UTI in the absence of UTI symptoms

## 2024-04-19 NOTE — Assessment & Plan Note (Signed)
-   resolved with vaginal estrogen  - 01/19/24 UA + heme, +UA microscopy and culture from catheterized sample. Pending repeat - For treatment of stress urinary incontinence,  non-surgical options include expectant management, weight loss, physical therapy, as well as a pessary.  Surgical options include a midurethral sling, Burch urethropexy, and transurethral injection of a bulking agent. - continue low dose vaginal estrogen with Imvexxy  - referral sent to pelvic floor PT, encouraged pt to reschedule -

## 2024-04-19 NOTE — Addendum Note (Signed)
 Addended by: KRYSTAL ANDREE GAILS on: 04/19/2024 02:52 PM   Modules accepted: Orders

## 2024-04-19 NOTE — Patient Instructions (Addendum)
-   discussed proper vulvar care, warm compression, avoid pad use, cotton only underwear and barrier ointment if needed   - continue Imvexxy  twice a week vaginally. Part the labia during insertion to avoid friction over labia  - use Vitamin E suppository daily for moisturization  - you can also use vaginal moisturizers such as Revaree or Replens daily for relief of burning and irritation  Increase your Benefiber to 3 teaspoons/day for stool consistency and reduce straining.   Please call [facility] to assess cost of estradiol  ointment. [address], [place] [address] Phone: [phone]  Fax: [phone]

## 2024-04-19 NOTE — Assessment & Plan Note (Signed)
-   bleeding noted from external left labia - For symptomatic vaginal atrophy options include lubrication with a water-based lubricant, personal hygiene measures and barrier protection against wetness, and estrogen replacement in the form of vaginal cream, vaginal tablets, or a time-released vaginal ring.   - We discussed the potential risks associated with hormone replacement including stroke, heart attack, and blood clots; and the fact that these risks are very low with vaginal estrogen use due to the very low systemic absorption rate of ~ 0.01% with a twice-week regimen. - reassured patient that low dose vaginal estrogen can be used in patients with a history of breast cancer without increased risk of cancer recurrence.  - encouraged to start moisturizer use, lubrication, Vitamin E vaginal suppositories - encouraged to discontinue pad use - discussed proper vulvar care, warm compression, cotton only underwear  - start barrier ointment with vaseline - continue Imvexxy  2x/week - Rx vaginal estrogen ointment from compounding pharmacy to assess cost. Will switch to ointment assess vaginal irritation if not cost prohibitive

## 2024-04-19 NOTE — Assessment & Plan Note (Signed)
-   Bristol III-IV stool with intermittent type I stool 2-3x/week - history of diverticulitis - For constipation, we reviewed the importance of a better bowel regimen.  We also discussed the importance of avoiding chronic straining, as it can exacerbate her pelvic floor symptoms; we discussed treating constipation and straining prior to surgery, as postoperative straining can lead to damage to the repair and recurrence of symptoms. We discussed initiating therapy with increasing fluid intake, fiber supplementation, stool softeners, and laxatives such as miralax.  - encouraged to increase fiber supplementation to optimize stool consistency and squatting position for bowel movements. - discussed association with pelvic floor disorders

## 2024-04-19 NOTE — Assessment & Plan Note (Signed)
-   The origin of pelvic floor muscle spasm can be multifactorial, including primary, reactive to a different pain source, trauma, or even part of a centralized pain syndrome.Treatment options include pelvic floor physical therapy, local (vaginal) or oral  muscle relaxants, pelvic muscle trigger point injections or centrally acting pain medications.   - encouraged to arrange appt with pelvic floor PT due to pelvic floor myofascial pain on exam

## 2024-04-19 NOTE — Progress Notes (Signed)
 [facility] Return Visit  SUBJECTIVE  History of Present Illness: Christina Edwards is a 78 y.o. female seen in follow-up for stress urinary incontinence, vaginal atrophy, feeling of incomplete emptying. Plan at last visit was start vaginal estrogen, pelvic floor PT, increasing fiber supplementation.   Reports pain after Imvexxy  vaginal estrogen insert since onset of estrogen use Reports bright red blood on pad today Denies blood in urine Reports burning and pain in vagina.  Started benefiber fiber supplementation 2 teaspoons 2x/day (same dose), was doing better until the last few weeks due to changes in diet with Bristol II-IV.  Nocturia resolved, 2x/night down to 0-1x/night  Denies urinary leakage.  Did not proceed with pelvic floor PT due to improvement in symptoms.  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   Past Surgical History: She  has a past surgical history that includes Oophorectomy (1989); Tubal ligation (1984); Appendectomy (1954); Tonsillectomy (1955); Breast surgery (1987); Eye surgery; Hand surgery; Cervical biopsy w/ loop electrode excision; Breast lumpectomy (Left, 1987); Colonoscopy (02/2020); Laparoscopic hysterectomy (N/A, 05/15/2020); Cystoscopy (N/A, 05/15/2020); and Laparoscopic salpingo oophorectomy (Right, 05/15/2020).   Medications: She has a current medication list which includes the following prescription(s): calcium  & magnesium carbonates, aspirin  ec, calcium  carbonate antacid, calcium -vitamin d-vitamin k, cholecalciferol, cyanocobalamin, fexofenadine, ibuprofen ,  imvexxy  maintenance pack, lidocaine , nitrofurantoin  (macrocrystal-monohydrate), rosuvastatin , saline, wheat dextrin, and [START ON 04/21/2024] estradiol .   Allergies: Patient is allergic to conjugated estrogens , sulfa antibiotics, adhesive [tape], and latex.   Social History: Patient  reports that she has quit smoking. She has never used smokeless tobacco. She reports that she does not drink alcohol and does not use drugs.     OBJECTIVE     Physical Exam: Vitals:   04/19/24 0900  BP: 131/75  Pulse: 64    Physical Exam Constitutional:      General: She is not in acute distress. Genitourinary:     Bladder and urethral meatus normal.     No lesions in the vagina.     Genitourinary Comments: Small amount of active bleeding noted from superficial abrasion over vessel on left vulva.      Right Labia: No rash, tenderness, lesions, skin changes or Bartholin's cyst.    Left Labia: skin changes.     Left Labia: No tenderness, lesions, Bartholin's cyst or rash.    No labial fusion noted.        No vaginal discharge, erythema, tenderness, bleeding, ulceration or granulation tissue.     Posterior vaginal prolapse present.    Moderate vaginal atrophy present.    Uterus is absent.     Urethral meatus caruncle not present.    No urethral prolapse, tenderness or mass present.     Levator ani is tender and obturator internus is tender.  Cardiovascular:     Rate and Rhythm: Normal rate.  Pulmonary:     Effort: Pulmonary effort is normal. No respiratory distress.  Neurological:     Mental Status: She is alert.  Vitals reviewed. Exam conducted with a chaperone present.      ASSESSMENT AND PLAN    [name] is a 78 y.o. with:  1. Stress incontinence   2. Vaginal atrophy  3. Constipation, unspecified constipation type   4. Abnormal urinalysis   5. Dyspareunia, female     Stress incontinence Assessment & Plan: - resolved with vaginal estrogen  - 01/19/24 UA + heme, +UA  microscopy and culture from catheterized sample. Pending repeat - For treatment of stress urinary incontinence,  non-surgical options include expectant management, weight loss, physical therapy, as well as a pessary.  Surgical options include a midurethral sling, Burch urethropexy, and transurethral injection of a bulking agent. - continue low dose vaginal estrogen with Imvexxy  - referral sent to pelvic floor PT, encouraged pt to reschedule -     Vaginal atrophy Assessment & Plan: - bleeding noted from external left labia - For symptomatic vaginal atrophy options include lubrication with a water-based lubricant, personal hygiene measures and barrier protection against wetness, and estrogen replacement in the form of vaginal cream, vaginal tablets, or a time-released vaginal ring.   - We discussed the potential risks associated with hormone replacement including stroke, heart attack, and blood clots; and the fact that these risks are very low with vaginal estrogen use due to the very low systemic absorption rate of ~ 0.01% with a twice-week regimen. - reassured patient that low dose vaginal estrogen can be used in patients with a history of breast cancer without increased risk of cancer recurrence.  - encouraged to start moisturizer use, lubrication, Vitamin E vaginal suppositories - encouraged to discontinue pad use - discussed proper vulvar care, warm compression, cotton only underwear  - start barrier ointment with vaseline - continue Imvexxy  2x/week - Rx vaginal estrogen ointment from compounding pharmacy to assess cost. Will switch to ointment assess vaginal irritation if not cost prohibitive   Constipation, unspecified constipation type Assessment & Plan: - Bristol III-IV stool with intermittent type I stool 2-3x/week - history of diverticulitis - For constipation, we reviewed the importance of a better bowel regimen.  We also discussed the importance of avoiding chronic straining, as  it can exacerbate her pelvic floor symptoms; we discussed treating constipation and straining prior to surgery, as postoperative straining can lead to damage to the repair and recurrence of symptoms. We discussed initiating therapy with increasing fluid intake, fiber supplementation, stool softeners, and laxatives such as miralax.  - encouraged to increase fiber supplementation to optimize stool consistency and squatting position for bowel movements. - discussed association with pelvic floor disorders   Abnormal urinalysis Assessment & Plan: - 01/19/24 clean catch UA + heme, UA microscopy with catheterized sample 0-5 RBC/hpf - started Imvexxy  - repeat urine test after resolution of bleeding - discussed cystoscopy if refractory symptoms - avoid treatment of UTI in the absence of UTI symptoms  Orders: -     POCT URINALYSIS DIP (CLINITEK)  Dyspareunia, female Assessment & Plan: - The origin of pelvic floor muscle spasm can be multifactorial, including primary, reactive to a different pain source, trauma, or even part of a centralized pain syndrome.Treatment options include pelvic floor physical therapy, local (vaginal) or oral  muscle relaxants, pelvic muscle trigger point injections or centrally acting pain medications.   - encouraged to arrange appt with pelvic floor PT due to pelvic floor myofascial pain on exam   Other orders -     Estradiol ; Place 1g twice a week  Dispense: 30 g; Refill: 3   Time spent: I spent 52 minutes dedicated to the care of this patient on the date of this encounter to include pre-visit review of records, face-to-face time with the patient discussing stage II pelvic organ  prolapse, vulvodynia, vaginal atrophy, abnormal urinalysis, stress urinary incontinence, dyspareunia, and post visit documentation and ordering medication/ testing.    Lianne ONEIDA Gillis, MD

## 2024-04-26 NOTE — Telephone Encounter (Signed)
 Spoke to Christina Edwards. She has been notified and aware of results. She indicates she has not had any more vaginal bleeding. Per Dr Guadlupe a nurse visit has been scheduled for next week for recollection. May need to do a cath sample.

## 2024-05-02 ENCOUNTER — Other Ambulatory Visit (HOSPITAL_COMMUNITY)
Admission: RE | Admit: 2024-05-02 | Discharge: 2024-05-02 | Disposition: A | Discharge status: Home / Self Care | Source: Other Acute Inpatient Hospital | Attending: Obstetrics | Admitting: Obstetrics

## 2024-05-02 ENCOUNTER — Other Ambulatory Visit (HOSPITAL_COMMUNITY)
Admission: RE | Admit: 2024-05-02 | Discharge: 2024-05-02 | Disposition: A | Discharge status: Home / Self Care | Source: Home / Self Care

## 2024-05-02 ENCOUNTER — Ambulatory Visit (INDEPENDENT_AMBULATORY_CARE_PROVIDER_SITE_OTHER)

## 2024-05-02 VITALS — BP 104/57 | HR 63

## 2024-05-02 DIAGNOSIS — R319 Hematuria, unspecified: Secondary | ICD-10-CM

## 2024-05-02 DIAGNOSIS — R102 Pelvic and perineal pain: Secondary | ICD-10-CM | POA: Insufficient documentation

## 2024-05-02 DIAGNOSIS — R350 Frequency of micturition: Secondary | ICD-10-CM | POA: Insufficient documentation

## 2024-05-02 LAB — POCT URINALYSIS DIP (CLINITEK)
Bilirubin, UA: NEGATIVE
Glucose, UA: NEGATIVE mg/dL
Ketones, POC UA: NEGATIVE mg/dL
Leukocytes, UA: NEGATIVE
Nitrite, UA: NEGATIVE
POC PROTEIN,UA: NEGATIVE
Spec Grav, UA: <=1.005 — AB (ref 1.010–1.025)
Urobilinogen, UA: 0.2 U/dL
pH, UA: 6.5 (ref 5.0–8.0)

## 2024-05-02 LAB — URINALYSIS, COMPLETE (UACMP) WITH MICROSCOPIC
Bilirubin Urine: NEGATIVE
Glucose, UA: NEGATIVE mg/dL
Hgb urine dipstick: NEGATIVE
Ketones, ur: NEGATIVE mg/dL
Leukocytes,Ua: NEGATIVE
Nitrite: NEGATIVE
Protein, ur: NEGATIVE mg/dL
Specific Gravity, Urine: 1.004 — ABNORMAL LOW (ref 1.005–1.030)
pH: 7 (ref 5.0–8.0)

## 2024-05-02 NOTE — Progress Notes (Signed)
 Christina Edwards is a 78 y.o. female  arrived today with UTI sx.  A urine specimen was collected and POCT Urine was done. POCT Urine was Negative Urine was sent for culture

## 2024-05-02 NOTE — Patient Instructions (Signed)
 Your Urine dip that was done in office was Negative. I am sending the urine off for culture and you can take AZO over the counter for your discomfort.  If an antibiotic is needed we will sent the order to the pharmacy and you will be notified. If you have any questions or concerns please feel free to call us  at [phone]

## 2024-05-04 ENCOUNTER — Ambulatory Visit: Payer: Self-pay | Admitting: Obstetrics and Gynecology

## 2024-05-04 ENCOUNTER — Telehealth: Payer: Self-pay

## 2024-05-04 DIAGNOSIS — N3000 Acute cystitis without hematuria: Secondary | ICD-10-CM

## 2024-05-04 LAB — URINE CULTURE: Culture: 40000 — AB

## 2024-05-04 MED ORDER — NITROFURANTOIN MONOHYD MACRO 100 MG PO CAPS: 100.0000 mg | ORAL_CAPSULE | Freq: Two times a day (BID) | ORAL | 0 refills | Status: DC

## 2024-05-04 MED ORDER — NITROFURANTOIN MONOHYD MACRO 100 MG PO CAPS: 100.0000 mg | ORAL_CAPSULE | Freq: Two times a day (BID) | ORAL | 0 refills | Status: AC

## 2024-05-04 NOTE — Telephone Encounter (Signed)
 Patient has been notified and aware. She will pick up her antibiotic and begin treatment.

## 2024-05-04 NOTE — Telephone Encounter (Signed)
 Patient called the office stating she need her Antibiotics sent to her at [facility] in [place].

## 2024-05-12 ENCOUNTER — Other Ambulatory Visit: Payer: Self-pay

## 2024-05-12 ENCOUNTER — Ambulatory Visit: Attending: Obstetrics

## 2024-05-12 DIAGNOSIS — M6281 Muscle weakness (generalized): Secondary | ICD-10-CM | POA: Insufficient documentation

## 2024-05-12 DIAGNOSIS — R279 Unspecified lack of coordination: Secondary | ICD-10-CM | POA: Diagnosis present

## 2024-05-12 NOTE — Patient Instructions (Signed)

## 2024-05-12 NOTE — Therapy (Signed)
 OUTPATIENT PHYSICAL THERAPY FEMALE PELVIC EVALUATION   Patient Name: Christina Edwards MRN: [id number] DOB:[birth year], 78 y.o., female Today's Date: 05/12/2024  END OF SESSION:  PT End of Session - 05/12/24 0844     Visit Number 1    Date for PT Re-Evaluation 08/04/24    Authorization Type [employer] Medicare          Past Medical History:  Diagnosis Date   Anemia    Basal cell carcinoma    Breast cancer (HCC) 1987   Left breast intraductal; lumpectomy, RT (unsure about receptor status)   Diverticulitis    Endometriosis    diagnosed in the 80s (ovary removed on one side)   GERD (gastroesophageal reflux disease)    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/2019   LGSIL on Pap smear of cervix    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    Nocturnal leg cramps    Osteopenia 11/2018   T score -1.9 asked 9% / 1.4%   Personal history of radiation therapy    PONV (postoperative nausea and vomiting)    Past Surgical History:  Procedure Laterality Date   APPENDECTOMY  1954   BREAST LUMPECTOMY Left 1987   BREAST SURGERY  1987   Lumpectomy   CERVICAL BIOPSY  W/ LOOP ELECTRODE EXCISION     COLONOSCOPY  02/2020   CYSTOSCOPY N/A 05/15/2020   Procedure: CYSTOSCOPY;  Surgeon: Arnaldo Purchase, MD;  Location: [facility] [place];  Service: Gynecology;  Laterality: N/A;   EYE SURGERY     laser to fix torn retina   HAND SURGERY     left hand   LAPAROSCOPIC HYSTERECTOMY N/A 05/15/2020   Procedure: HYSTERECTOMY TOTAL LAPAROSCOPIC;  Surgeon: Arnaldo Purchase, MD;  Location: [facility];  Service: Gynecology;  Laterality: N/A;  request 7:30 am OR time in [place] [facility] block requests two hours   LAPAROSCOPIC SALPINGO OOPHERECTOMY Right 05/15/2020   Procedure: LAPAROSCOPIC SALPINGO OOPHORECTOMY;  Surgeon: Arnaldo Purchase, MD;  Location: [facility];  Service: Gynecology;  Laterality: Right;   OOPHORECTOMY   1989   LSO   TONSILLECTOMY  1955   TUBAL LIGATION  1984   Patient Active Problem List   Diagnosis Date Noted   Dyspareunia, female 04/19/2024   Vaginal atrophy 01/19/2024   Stress incontinence 01/19/2024   Feeling of incomplete bladder emptying 01/19/2024   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 01/19/2024   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   Constipation 01/19/2024   Abnormal urinalysis 01/19/2024   CIN I (cervical intraepithelial neoplasia I) 05/26/2019   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   Pure hypercholesterolemia 05/24/2019   Coronary artery disease involving native coronary artery of native heart without angina pectoris 02/21/2019   Chest pain 02/04/2019   History of breast cancer 02/04/2019   Abnormal Pap smear, low grade squamous intraepithelial lesion (LGSIL) 04/26/2013   High grade squamous intraepithelial lesion (HGSIL), grade 2 CIN, on biopsy of cervix 09/10/2012   GERD (gastroesophageal reflux disease)    Endometriosis    Osteopenia    Breast cancer (HCC)     PCP: Claudene Pellet, MD  REFERRING PROVIDER: Guadlupe Lianne DASEN, MD   REFERRING DIAG: N39.3 (ICD-10-CM) - Stress incontinence N95.2 (ICD-10-CM) - Vaginal atrophy R39.14 (ICD-10-CM) - Feeling of incomplete bladder emptying  THERAPY DIAG:  Muscle weakness (generalized)  Unspecified lack of coordination  Rationale for Evaluation and Treatment: Rehabilitation  ONSET DATE: 06/2023  SUBJECTIVE:  SUBJECTIVE STATEMENT: 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 Since her last colposcopy she has has a lot of pain since. She tried an estrogen cream and pain became worse. Medication was changed and she is taking Imvexy 2x/week and replens. These both cause burning.    PAIN:  Are you having pain? Yes NPRS scale:  9/10 at worst Pain location: Vaginal  Pain type: burning Pain description: intermittent   Aggravating factors: vaginal medication and moisturizers  Relieving factors: not putting anything in vaginal canal   PRECAUTIONS: Other: history of radiation therapy for breast cancer  RED FLAGS: None   WEIGHT BEARING RESTRICTIONS: No  FALLS:  Has patient fallen in last 6 months? No  OCCUPATION: retired Child psychotherapist  ACTIVITY LEVEL : walking daily  PLOF: Independent  PATIENT GOALS: decrease vaginal pain  PERTINENT HISTORY:  Laparoscopic hysterectomy 2021, oophorectomy 1989, tubal ligation 1984, osteopenia, history of radiation/breast cancer 1987/2012, endometriosis, diverticulitis, GERD, vulvodynia, stage 2 rectocele, chronic constipation  Sexual abuse: No  BOWEL MOVEMENT: Pain with bowel movement: No Type of bowel movement:Type (Bristol Stool Scale) 4, Frequency doing better - 1x/day usually, and Strain sometimes Fully empty rectum: Yes: sometimes  Leakage: No Pads: No Fiber supplement/laxative Yes - fiber  URINATION: Pain with urination: No Fully empty bladder: Yes:   Stream: may have to double void Urgency: Yes  Frequency: every 2 hours; sometimes will wake up 1x/night Fluid Intake: 2-3 bottles of water a day; cup of tea in the morning; occasional diet coke Leakage: very occasional with coughing Pads: Yes: panty liner when she leaves the house (doctor has instructed her not to due to irritation)  INTERCOURSE:  Ability to have vaginal penetration Yes  Pain with intercourse: none DrynessNo Climax: WNL Marinoff Scale: 0/3 Lubricant: yes  PREGNANCY: Vaginal deliveries 1   PROLAPSE: None   OBJECTIVE:  Note: Objective measures were completed at Evaluation unless otherwise noted.  05/12/24 PATIENT SURVEYS:   PFIQ-7: 38  COGNITION: Overall cognitive status: Within functional limits for tasks assessed     SENSATION: Light touch: Appears  intact   FUNCTIONAL TESTS:  Squat: WNL Single leg stance:  Rt: stable  Lt: stable Curl-up test: WNL   GAIT: Assistive device utilized: none Comments: WNL  POSTURE: rounded shoulders, forward head, decreased lumbar lordosis, and posterior pelvic tilt   LUMBARAROM/PROM:  A/PROM A/PROM  Eval (% available)  Flexion 75  Extension 100  Right lateral flexion 100   Left lateral flexion 100  Right rotation 25  Left rotation 25   (Blank rows = not tested)  PALPATION:   General: WNL  Pelvic Alignment: Posterior pelvic tilt  Abdominal: decreased sternocostal angle; scar tissue restriction most notable in Rt lower quadrant; apical breathing pattern                External Perineal Exam: dryness, pale, red                             Internal Pelvic Floor: burning, not severe; most vaginal canal stenosis  Patient confirms identification and approves PT to assess internal pelvic floor and treatment Yes  PELVIC MMT:   MMT eval  Vaginal 1/5, 1 second hold, 4 repeat contractions; poor coordination  Diastasis Recti WNL  (Blank rows = not tested)        TONE: low  PROLAPSE: Grade 1 anterior vaginal wall prolapse  TODAY'S TREATMENT:  DATE:  05/12/24 EVAL  Neuromuscular re-education: Pt provides verbal consent for internal vaginal/rectal pelvic floor exam. Internal vaginal pelvic floor muscle contraction training Quick flicks Long holds Therapeutic activities: Education on vaginal moisturizers  Pt education on impact of abdominal scar tissue and decreased pelvic floor muscle mobility on pain and circulation    PATIENT EDUCATION:  Education details: See above Person educated: Patient Education method: Programmer, multimedia, Demonstration, Tactile cues, Verbal cues, and Handouts Education comprehension: verbalized understanding  HOME EXERCISE  PROGRAM: [id number]  ASSESSMENT:  CLINICAL IMPRESSION: Patient is a 78 y.o. female who was seen today for physical therapy evaluation and treatment for vaginal pain and burning. Exam findings notable for decreased thoracolumbar rotation, abdominal scar tissue restriction, pelvic floor muscle weakness and decreased endurance, vulvar/vaginal atrophy, burning with palpation of vaginal canal and pelvic floor muscles, mild vaginal canal stenosis, and anterior vaginal wall laxity. Signs and symptoms are most consistent with vaginal atrophy, decreased pelvic floor muscle mobility/circulation, and abdominal scar tissue restriction. Symptoms are causing pain that is bothersome and makes functional activities difficult. Initial treatment included pelvic floor muscle contraction training in order to improve mobility and circulation in vaginal canal; we discussed vaginal moisturizers and more regular application, starting with coconut oil. She will continue to benefit from skilled PT intervention in order to decrease vaginal pain, address impairments, and improve quality of life.   OBJECTIVE IMPAIRMENTS: decreased activity tolerance, decreased coordination, decreased endurance, decreased mobility, decreased ROM, decreased strength, increased fascial restrictions, increased muscle spasms, impaired flexibility, impaired tone, improper body mechanics, postural dysfunction, and pain.   ACTIVITY LIMITATIONS: vaginal pain  PARTICIPATION LIMITATIONS: cleaning, community activity, and exercise  PERSONAL FACTORS: 1 comorbidity: medical history are also affecting patient's functional outcome.   REHAB POTENTIAL: Good  CLINICAL DECISION MAKING: Stable/uncomplicated  EVALUATION COMPLEXITY: Low   GOALS: Goals reviewed with patient? Yes  SHORT TERM GOALS: Target date: 06/09/2024   Pt will be independent with HEP in order to improve activity tolerance.   Baseline: Goal status: INITIAL  2.  Patient will report 25%  improve in vaginal pain in order to increase activity tolerance.    Baseline: 9/10 Goal status: INITIAL  3.  Pt will increase pelvic floor muscle strength and coordination to 2/5 in order to improve vaginal canal mobility that will help decrease pain.   Baseline: 1/5 Goal status: INITIAL  4.  Pt will be independent with regular vaginal moisturizing to help decrease pain.  Baseline:  Goal status: INITIAL   LONG TERM GOALS: Target date: 08/04/2025  Pt will be independent with advanced HEP in order to improve activity tolerance.   Baseline:  Goal status: INITIAL  2.  Patient will report 75% improve in vaginal pain in order to increase activity tolerance.   Baseline: 9/10 Goal status: INITIAL  3.  Pt will increase pelvic floor muscle strength and coordination to 3/5 in order to improve vaginal canal mobility that will help decrease pain.  Baseline:  Goal status: INITIAL  4.  Pt will increase pelvic floor muscle endurance to >10 seconds in order to improve pelvic floor muscle circulation and abdominal support that will help decrease pain.  Baseline:  Goal status: INITIAL    PLAN:  PT FREQUENCY: 1-2x/week  PT DURATION: 8 visits    PLANNED INTERVENTIONS: [id number]- PT Re-evaluation, 97110-Therapeutic exercises, 97530- Therapeutic activity, [id number]- Neuromuscular re-education, 97535- Self Care, [id number]- Manual therapy, [id number]- Gait training, [phone]- Aquatic Therapy, [phone]- Electrical stimulation (unattended), [id number]- Traction (mechanical), [id number]- Ionotophoresis 4mg /ml Dexamethasone , 79439 (1-2 muscles), 79438 (  3+ muscles)- Dry Needling, Patient/Family education, Balance training, Taping, Joint mobilization, Joint manipulation, Spinal manipulation, Spinal mobilization, Scar mobilization, Vestibular training, Cryotherapy, Moist heat, and Biofeedback  PLAN FOR NEXT SESSION: progress to core strengthening; abdominal scar tissue mobilization; address decreased thoracolumbar rotation; mobility  exercises   Josette Mares, PT, DPT08/21/259:25 AM

## 2024-05-24 LAB — LAB REPORT - SCANNED
EGFR: 75
TSH: 0.94

## 2024-05-25 ENCOUNTER — Ambulatory Visit: Attending: Obstetrics

## 2024-05-25 DIAGNOSIS — R279 Unspecified lack of coordination: Secondary | ICD-10-CM | POA: Diagnosis present

## 2024-05-25 DIAGNOSIS — M6281 Muscle weakness (generalized): Secondary | ICD-10-CM | POA: Insufficient documentation

## 2024-05-25 NOTE — Therapy (Signed)
 OUTPATIENT PHYSICAL THERAPY FEMALE PELVIC TREATMENT   Patient Name: Christina Edwards MRN: [id number] DOB:02-Feb-1946, 78 y.o., female Today's Date: 05/25/2024  END OF SESSION:  PT End of Session - 05/25/24 1446     Visit Number 2    Date for PT Re-Evaluation 08/04/24    Authorization Type [employer]    Progress Note Due on Visit 10    PT Start Time 1445    PT Stop Time 1525    PT Time Calculation (min) 40 min    Activity Tolerance Patient tolerated treatment well    Behavior During Therapy WFL for tasks assessed/performed           Past Medical History:  Diagnosis Date   Anemia    Basal cell carcinoma    Breast cancer (HCC) 1987   Left breast intraductal; lumpectomy, RT (unsure about receptor status)   Diverticulitis    Endometriosis    diagnosed in the 80s (ovary removed on one side)   GERD (gastroesophageal reflux disease)    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/2019   LGSIL on Pap smear of cervix    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    Nocturnal leg cramps    Osteopenia 11/2018   T score -1.9 asked 9% / 1.4%   Personal history of radiation therapy    PONV (postoperative nausea and vomiting)    Past Surgical History:  Procedure Laterality Date   APPENDECTOMY  1954   BREAST LUMPECTOMY Left 1987   BREAST SURGERY  1987   Lumpectomy   CERVICAL BIOPSY  W/ LOOP ELECTRODE EXCISION     COLONOSCOPY  02/2020   CYSTOSCOPY N/A 05/15/2020   Procedure: CYSTOSCOPY;  Surgeon: Arnaldo Purchase, MD;  Location: [facility] [place];  Service: Gynecology;  Laterality: N/A;   EYE SURGERY     laser to fix torn retina   HAND SURGERY     left hand   LAPAROSCOPIC HYSTERECTOMY N/A 05/15/2020   Procedure: HYSTERECTOMY TOTAL LAPAROSCOPIC;  Surgeon: Arnaldo Purchase, MD;  Location: [facility] [place];  Service: Gynecology;  Laterality: N/A;  request 7:30 am OR time in [place] [facility] block requests two hours    LAPAROSCOPIC SALPINGO OOPHERECTOMY Right 05/15/2020   Procedure: LAPAROSCOPIC SALPINGO OOPHORECTOMY;  Surgeon: Arnaldo Purchase, MD;  Location: [facility];  Service: Gynecology;  Laterality: Right;   OOPHORECTOMY  1989   LSO   TONSILLECTOMY  1955   TUBAL LIGATION  1984   Patient Active Problem List   Diagnosis Date Noted   Dyspareunia, female 04/19/2024   Vaginal atrophy 01/19/2024   Stress incontinence 01/19/2024   Feeling of incomplete bladder emptying 01/19/2024   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 01/19/2024   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   Constipation 01/19/2024   Abnormal urinalysis 01/19/2024   CIN I (cervical intraepithelial neoplasia I) 05/26/2019   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   Pure hypercholesterolemia 05/24/2019   Coronary artery disease involving native coronary artery of native heart without angina pectoris 02/21/2019   Chest pain 02/04/2019   History of breast cancer 02/04/2019   Abnormal Pap smear, low grade squamous intraepithelial lesion (LGSIL) 04/26/2013   High grade squamous intraepithelial lesion (HGSIL), grade 2 CIN, on biopsy of cervix 09/10/2012   GERD (gastroesophageal reflux disease)    Endometriosis    Osteopenia    Breast cancer (HCC)     PCP: Claudene Pellet, MD  REFERRING PROVIDER: Gu[place],  Lianne DASEN, MD   REFERRING DIAG: N39.3 (ICD-10-CM) - Stress incontinence N95.2 (ICD-10-CM) - Vaginal atrophy R39.14 (ICD-10-CM) - Feeling of incomplete bladder emptying  THERAPY DIAG:  Muscle weakness (generalized)  Unspecified lack of coordination  Rationale for Evaluation and Treatment: Rehabilitation  ONSET DATE: 06/2023  SUBJECTIVE:                                                                                                                                                                                           SUBJECTIVE STATEMENT: Pt states that she has returned to using coconut oil and  feels like it has been very helpful. She has also been working on exercises    PAIN: 05/25/24 Are you having pain? Yes NPRS scale: 6/10 at worst Pain location: Vaginal  Pain type: burning Pain description: intermittent   Aggravating factors: vaginal medication and moisturizers  Relieving factors: not putting anything in vaginal canal   PRECAUTIONS: Other: history of radiation therapy for breast cancer  RED FLAGS: None   WEIGHT BEARING RESTRICTIONS: No  FALLS:  Has patient fallen in last 6 months? No  OCCUPATION: retired Child psychotherapist  ACTIVITY LEVEL : walking daily  PLOF: Independent  PATIENT GOALS: decrease vaginal pain  PERTINENT HISTORY:  Laparoscopic hysterectomy 2021, oophorectomy 1989, tubal ligation 1984, osteopenia, history of radiation/breast cancer 1987/2012, endometriosis, diverticulitis, GERD, vulvodynia, stage 2 rectocele, chronic constipation  Sexual abuse: No  BOWEL MOVEMENT: Pain with bowel movement: No Type of bowel movement:Type (Bristol Stool Scale) 4, Frequency doing better - 1x/day usually, and Strain sometimes Fully empty rectum: Yes: sometimes  Leakage: No Pads: No Fiber supplement/laxative Yes - fiber  URINATION: Pain with urination: No Fully empty bladder: Yes:   Stream: may have to double void Urgency: Yes  Frequency: every 2 hours; sometimes will wake up 1x/night Fluid Intake: 2-3 bottles of water a day; cup of tea in the morning; occasional diet coke Leakage: very occasional with coughing Pads: Yes: panty liner when she leaves the house (doctor has instructed her not to due to irritation)  INTERCOURSE:  Ability to have vaginal penetration Yes  Pain with intercourse: none DrynessNo Climax: WNL Marinoff Scale: 0/3 Lubricant: yes  PREGNANCY: Vaginal deliveries 1   PROLAPSE: None   OBJECTIVE:  Note: Objective measures were completed at Evaluation unless otherwise noted.  05/12/24 PATIENT SURVEYS:   PFIQ-7:  38  COGNITION: Overall cognitive status: Within functional limits for tasks assessed     SENSATION: Light touch: Appears intact   FUNCTIONAL TESTS:  Squat: WNL Single leg stance:  Rt: stable  Lt: stable Curl-up test: WNL   GAIT: Assistive device utilized: none Comments:  WNL  POSTURE: rounded shoulders, forward head, decreased lumbar lordosis, and posterior pelvic tilt   LUMBARAROM/PROM:  A/PROM A/PROM  Eval (% available)  Flexion 75  Extension 100  Right lateral flexion 100   Left lateral flexion 100  Right rotation 25  Left rotation 25   (Blank rows = not tested)  PALPATION:   General: WNL  Pelvic Alignment: Posterior pelvic tilt  Abdominal: decreased sternocostal angle; scar tissue restriction most notable in Rt lower quadrant; apical breathing pattern                External Perineal Exam: dryness, pale, red                             Internal Pelvic Floor: burning, not severe; most vaginal canal stenosis  Patient confirms identification and approves PT to assess internal pelvic floor and treatment Yes  PELVIC MMT:   MMT eval  Vaginal 1/5, 1 second hold, 4 repeat contractions; poor coordination  Diastasis Recti WNL  (Blank rows = not tested)        TONE: low  PROLAPSE: Grade 1 anterior vaginal wall prolapse  TODAY'S TREATMENT:                                                                                                                              DATE:  05/25/24 Manual: Supported supine lower abdominal scar tissue mobilization  Neuromuscular re-education: Transversus abdominus training with multimodal cues for improved motor control and breath coordination Bil supine UE ball press with transversus abdominus and pelvic floor muscle contractions and breath coordination 10x Supine hip adduction ball press with transversus abdominus and pelvic floor muscle contractions and breath coordination 10x Bridge with hip adduction, transversus  abdominus, and pelvic floor muscle 2 x 10 Supine unilateral bent knee fall out + red band 10x bil Supine resisted march + red band 2 x 10 Exercises: Supine lower trunk rotation 2 x 10 Open books 10x bil   05/12/24 EVAL  Neuromuscular re-education: Pt provides verbal consent for internal vaginal/rectal pelvic floor exam. Internal vaginal pelvic floor muscle contraction training Quick flicks Long holds Therapeutic activities: Education on vaginal moisturizers  Pt education on impact of abdominal scar tissue and decreased pelvic floor muscle mobility on pain and circulation    PATIENT EDUCATION:  Education details: See above Person educated: Patient Education method: Programmer, multimedia, Facilities manager, Actor cues, Verbal cues, and Handouts Education comprehension: verbalized understanding  HOME EXERCISE PROGRAM: [id number]  ASSESSMENT:  CLINICAL IMPRESSION: Patient is a 78 y.o. female who was seen today for physical therapy evaluation and treatment for vaginal pain and burning. Pt doing very well with good improvements after initial evaluation. We worked on lower abdominal scar tissue mobilization with good tolerance and improvements in mobility. Core training performed with excellent coordination, but pt felt uncertain about appropriate proprioception of contraction. She did build confidence throughout session and able to progress  with good tolerance. HEP updated. She will continue to benefit from skilled PT intervention in order to decrease vaginal pain, address impairments, and improve quality of life.   OBJECTIVE IMPAIRMENTS: decreased activity tolerance, decreased coordination, decreased endurance, decreased mobility, decreased ROM, decreased strength, increased fascial restrictions, increased muscle spasms, impaired flexibility, impaired tone, improper body mechanics, postural dysfunction, and pain.   ACTIVITY LIMITATIONS: vaginal pain  PARTICIPATION LIMITATIONS: cleaning, community  activity, and exercise  PERSONAL FACTORS: 1 comorbidity: medical history are also affecting patient's functional outcome.   REHAB POTENTIAL: Good  CLINICAL DECISION MAKING: Stable/uncomplicated  EVALUATION COMPLEXITY: Low   GOALS: Goals reviewed with patient? Yes  SHORT TERM GOALS: Target date: 06/09/2024   Pt will be independent with HEP in order to improve activity tolerance.   Baseline: Goal status: INITIAL  2.  Patient will report 25% improve in vaginal pain in order to increase activity tolerance.    Baseline: 9/10 Goal status: INITIAL  3.  Pt will increase pelvic floor muscle strength and coordination to 2/5 in order to improve vaginal canal mobility that will help decrease pain.   Baseline: 1/5 Goal status: INITIAL  4.  Pt will be independent with regular vaginal moisturizing to help decrease pain.  Baseline:  Goal status: INITIAL   LONG TERM GOALS: Target date: 08/04/2025  Pt will be independent with advanced HEP in order to improve activity tolerance.   Baseline:  Goal status: INITIAL  2.  Patient will report 75% improve in vaginal pain in order to increase activity tolerance.   Baseline: 9/10 Goal status: INITIAL  3.  Pt will increase pelvic floor muscle strength and coordination to 3/5 in order to improve vaginal canal mobility that will help decrease pain.  Baseline:  Goal status: INITIAL  4.  Pt will increase pelvic floor muscle endurance to >10 seconds in order to improve pelvic floor muscle circulation and abdominal support that will help decrease pain.  Baseline:  Goal status: INITIAL    PLAN:  PT FREQUENCY: 1-2x/week  PT DURATION: 8 visits    PLANNED INTERVENTIONS: [id number]- PT Re-evaluation, 97110-Therapeutic exercises, 97530- Therapeutic activity, 97112- Neuromuscular re-education, 97535- Self Care, [id number]- Manual therapy, [phone]- Gait training, [phone]- Aquatic Therapy, [phone]- Electrical stimulation (unattended), [phone]- Traction  (mechanical), [id number]- Ionotophoresis 4mg /ml Dexamethasone , [id number] (1-2 muscles), [id number] (3+ muscles)- Dry Needling, Patient/Family education, Balance training, Taping, Joint mobilization, Joint manipulation, Spinal manipulation, Spinal mobilization, Scar mobilization, Vestibular training, Cryotherapy, Moist heat, and Biofeedback  PLAN FOR NEXT SESSION: progress to core strengthening; abdominal scar tissue mobilization; address decreased thoracolumbar rotation; mobility exercises   Josette Mares, PT, DPT09/03/253:21 PM

## 2024-05-30 ENCOUNTER — Ambulatory Visit

## 2024-05-30 DIAGNOSIS — M6281 Muscle weakness (generalized): Secondary | ICD-10-CM | POA: Diagnosis not present

## 2024-05-30 DIAGNOSIS — R279 Unspecified lack of coordination: Secondary | ICD-10-CM

## 2024-05-30 NOTE — Therapy (Signed)
 OUTPATIENT PHYSICAL THERAPY FEMALE PELVIC TREATMENT   Patient Name: Christina Edwards MRN: [id number] DOB:1946/02/28, 78 y.o., female Today's Date: 05/30/2024  END OF SESSION:  PT End of Session - 05/30/24 1532     Visit Number 3    Date for PT Re-Evaluation 08/04/24    Authorization Type [employer] Medicare    Progress Note Due on Visit 10    PT Start Time 1532    PT Stop Time 1611    PT Time Calculation (min) 39 min    Activity Tolerance Patient tolerated treatment well    Behavior During Therapy WFL for tasks assessed/performed           Past Medical History:  Diagnosis Date   Anemia    Basal cell carcinoma    Breast cancer (HCC) 1987   Left breast intraductal; lumpectomy, RT (unsure about receptor status)   Diverticulitis    Endometriosis    diagnosed in the 80s (ovary removed on one side)   GERD (gastroesophageal reflux disease)    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/2019   LGSIL on Pap smear of cervix    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    Nocturnal leg cramps    Osteopenia 11/2018   T score -1.9 asked 9% / 1.4%   Personal history of radiation therapy    PONV (postoperative nausea and vomiting)    Past Surgical History:  Procedure Laterality Date   APPENDECTOMY  1954   BREAST LUMPECTOMY Left 1987   BREAST SURGERY  1987   Lumpectomy   CERVICAL BIOPSY  W/ LOOP ELECTRODE EXCISION     COLONOSCOPY  02/2020   CYSTOSCOPY N/A 05/15/2020   Procedure: CYSTOSCOPY;  Surgeon: Arnaldo Purchase, MD;  Location: [facility] [place];  Service: Gynecology;  Laterality: N/A;   EYE SURGERY     laser to fix torn retina   HAND SURGERY     left hand   LAPAROSCOPIC HYSTERECTOMY N/A 05/15/2020   Procedure: HYSTERECTOMY TOTAL LAPAROSCOPIC;  Surgeon: Arnaldo Purchase, MD;  Location: [facility];  Service: Gynecology;  Laterality: N/A;  request 7:30 am OR time in [place] Gyn block requests two hours    LAPAROSCOPIC SALPINGO OOPHERECTOMY Right 05/15/2020   Procedure: LAPAROSCOPIC SALPINGO OOPHORECTOMY;  Surgeon: Arnaldo Purchase, MD;  Location: [facility];  Service: Gynecology;  Laterality: Right;   OOPHORECTOMY  1989   LSO   TONSILLECTOMY  1955   TUBAL LIGATION  1984   Patient Active Problem List   Diagnosis Date Noted   Dyspareunia, female 04/19/2024   Vaginal atrophy 01/19/2024   Stress incontinence 01/19/2024   Feeling of incomplete bladder emptying 01/19/2024   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 01/19/2024   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   Constipation 01/19/2024   Abnormal urinalysis 01/19/2024   CIN I (cervical intraepithelial neoplasia I) 05/26/2019   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   Pure hypercholesterolemia 05/24/2019   Coronary artery disease involving native coronary artery of native heart without angina pectoris 02/21/2019   Chest pain 02/04/2019   History of breast cancer 02/04/2019   Abnormal Pap smear, low grade squamous intraepithelial lesion (LGSIL) 04/26/2013   High grade squamous intraepithelial lesion (HGSIL), grade 2 CIN, on biopsy of cervix 09/10/2012   GERD (gastroesophageal reflux disease)    Endometriosis    Osteopenia    Breast cancer (HCC)     PCP: Claudene Pellet, MD  REFERRING PROVIDER: Guadlupe,  Lianne DASEN, MD   REFERRING DIAG: N39.3 (ICD-10-CM) - Stress incontinence N95.2 (ICD-10-CM) - Vaginal atrophy R39.14 (ICD-10-CM) - Feeling of incomplete bladder emptying  THERAPY DIAG:  Muscle weakness (generalized)  Unspecified lack of coordination  Rationale for Evaluation and Treatment: Rehabilitation  ONSET DATE: 06/2023  SUBJECTIVE:                                                                                                                                                                                           SUBJECTIVE STATEMENT: Pt states that she feels like she continues to see  progress. She is consistently working on exercises. She started using estradiol  cream yesterday; she did have some stinging last night. She goes to see gynecologist Wednesday.     PAIN: 05/30/24 Are you having pain? Yes NPRS scale: 6/10 at worst Pain location: Vaginal  Pain type: burning Pain description: intermittent   Aggravating factors: vaginal medication and moisturizers  Relieving factors: not putting anything in vaginal canal   PRECAUTIONS: Other: history of radiation therapy for breast cancer  RED FLAGS: None   WEIGHT BEARING RESTRICTIONS: No  FALLS:  Has patient fallen in last 6 months? No  OCCUPATION: retired Child psychotherapist  ACTIVITY LEVEL : walking daily  PLOF: Independent  PATIENT GOALS: decrease vaginal pain  PERTINENT HISTORY:  Laparoscopic hysterectomy 2021, oophorectomy 1989, tubal ligation 1984, osteopenia, history of radiation/breast cancer 1987/2012, endometriosis, diverticulitis, GERD, vulvodynia, stage 2 rectocele, chronic constipation  Sexual abuse: No  BOWEL MOVEMENT: Pain with bowel movement: No Type of bowel movement:Type (Bristol Stool Scale) 4, Frequency doing better - 1x/day usually, and Strain sometimes Fully empty rectum: Yes: sometimes  Leakage: No Pads: No Fiber supplement/laxative Yes - fiber  URINATION: Pain with urination: No Fully empty bladder: Yes:   Stream: may have to double void Urgency: Yes  Frequency: every 2 hours; sometimes will wake up 1x/night Fluid Intake: 2-3 bottles of water a day; cup of tea in the morning; occasional diet coke Leakage: very occasional with coughing Pads: Yes: panty liner when she leaves the house (doctor has instructed her not to due to irritation)  INTERCOURSE:  Ability to have vaginal penetration Yes  Pain with intercourse: none DrynessNo Climax: WNL Marinoff Scale: 0/3 Lubricant: yes  PREGNANCY: Vaginal deliveries 1   PROLAPSE: None   OBJECTIVE:  Note: Objective measures were  completed at Evaluation unless otherwise noted.  05/12/24 PATIENT SURVEYS:   PFIQ-7: 38  COGNITION: Overall cognitive status: Within functional limits for tasks assessed     SENSATION: Light touch: Appears intact   FUNCTIONAL TESTS:  Squat: WNL Single leg stance:  Rt: stable  Lt:  stable Curl-up test: WNL   GAIT: Assistive device utilized: none Comments: WNL  POSTURE: rounded shoulders, forward head, decreased lumbar lordosis, and posterior pelvic tilt   LUMBARAROM/PROM:  A/PROM A/PROM  Eval (% available)  Flexion 75  Extension 100  Right lateral flexion 100   Left lateral flexion 100  Right rotation 25  Left rotation 25   (Blank rows = not tested)  PALPATION:   General: WNL  Pelvic Alignment: Posterior pelvic tilt  Abdominal: decreased sternocostal angle; scar tissue restriction most notable in Rt lower quadrant; apical breathing pattern                External Perineal Exam: dryness, pale, red                             Internal Pelvic Floor: burning, not severe; most vaginal canal stenosis  Patient confirms identification and approves PT to assess internal pelvic floor and treatment Yes  PELVIC MMT:   MMT eval  Vaginal 1/5, 1 second hold, 4 repeat contractions; poor coordination  Diastasis Recti WNL  (Blank rows = not tested)        TONE: low  PROLAPSE: Grade 1 anterior vaginal wall prolapse  TODAY'S TREATMENT:                                                                                                                              DATE:  05/30/24 Neuromuscular re-education: Bridge with hip adduction, transversus abdominus, and pelvic floor muscle 2 x 10 Supine lower trunk rotation + LE on red ball 3 x 10 Supine chop + LE on red ball + 3 lbs 2 x 10 bil Supine full shoulder flexion + 3 lbs + LE on red band 2 x 10 Seated hip adduction ball press with transversus abdominus and pelvic floor muscle 2 x 10 Seated hip abduction red band with  transversus abdominus and pelvic floor muscle 2 x 10 Seated resisted march red band with transversus abdominus and pelvic floor muscle 2 x 10 Therapeutic activities: Standing bil shoulder extension + green band 2 x 10 Sit<>stand to table 2 x 10   05/25/24 Manual: Supported supine lower abdominal scar tissue mobilization  Neuromuscular re-education: Transversus abdominus training with multimodal cues for improved motor control and breath coordination Bil supine UE ball press with transversus abdominus and pelvic floor muscle contractions and breath coordination 10x Supine hip adduction ball press with transversus abdominus and pelvic floor muscle contractions and breath coordination 10x Bridge with hip adduction, transversus abdominus, and pelvic floor muscle 2 x 10 Supine unilateral bent knee fall out + red band 10x bil Supine resisted march + red band 2 x 10 Exercises: Supine lower trunk rotation 2 x 10 Open books 10x bil   05/12/24 EVAL  Neuromuscular re-education: Pt provides verbal consent for internal vaginal/rectal pelvic floor exam. Internal vaginal pelvic floor muscle contraction training Quick flicks Long  holds Therapeutic activities: Education on vaginal moisturizers  Pt education on impact of abdominal scar tissue and decreased pelvic floor muscle mobility on pain and circulation    PATIENT EDUCATION:  Education details: See above Person educated: Patient Education method: Explanation, Demonstration, Tactile cues, Verbal cues, and Handouts Education comprehension: verbalized understanding  HOME EXERCISE PROGRAM: [id number]  ASSESSMENT:  CLINICAL IMPRESSION: Patient is a 78 y.o. female who was seen today for physical therapy evaluation and treatment for vaginal pain and burning. Pt doing very well with good improvements after initial evaluation. We worked on lower abdominal scar tissue mobilization with good tolerance and improvements in mobility. Core training  performed with excellent coordination, but pt felt uncertain about appropriate proprioception of contraction. She did build confidence throughout session and able to progress with good tolerance. HEP updated. She will continue to benefit from skilled PT intervention in order to decrease vaginal pain, address impairments, and improve quality of life.   OBJECTIVE IMPAIRMENTS: decreased activity tolerance, decreased coordination, decreased endurance, decreased mobility, decreased ROM, decreased strength, increased fascial restrictions, increased muscle spasms, impaired flexibility, impaired tone, improper body mechanics, postural dysfunction, and pain.   ACTIVITY LIMITATIONS: vaginal pain  PARTICIPATION LIMITATIONS: cleaning, community activity, and exercise  PERSONAL FACTORS: 1 comorbidity: medical history are also affecting patient's functional outcome.   REHAB POTENTIAL: Good  CLINICAL DECISION MAKING: Stable/uncomplicated  EVALUATION COMPLEXITY: Low   GOALS: Goals reviewed with patient? Yes  SHORT TERM GOALS: Target date: 06/09/2024   Pt will be independent with HEP in order to improve activity tolerance.   Baseline: Goal status: INITIAL  2.  Patient will report 25% improve in vaginal pain in order to increase activity tolerance.    Baseline: 9/10 Goal status: INITIAL  3.  Pt will increase pelvic floor muscle strength and coordination to 2/5 in order to improve vaginal canal mobility that will help decrease pain.   Baseline: 1/5 Goal status: INITIAL  4.  Pt will be independent with regular vaginal moisturizing to help decrease pain.  Baseline:  Goal status: INITIAL   LONG TERM GOALS: Target date: 08/04/2025  Pt will be independent with advanced HEP in order to improve activity tolerance.   Baseline:  Goal status: INITIAL  2.  Patient will report 75% improve in vaginal pain in order to increase activity tolerance.   Baseline: 9/10 Goal status: INITIAL  3.  Pt  will increase pelvic floor muscle strength and coordination to 3/5 in order to improve vaginal canal mobility that will help decrease pain.  Baseline:  Goal status: INITIAL  4.  Pt will increase pelvic floor muscle endurance to >10 seconds in order to improve pelvic floor muscle circulation and abdominal support that will help decrease pain.  Baseline:  Goal status: INITIAL    PLAN:  PT FREQUENCY: 1-2x/week  PT DURATION: 8 visits    PLANNED INTERVENTIONS: [id number]- PT Re-evaluation, 97110-Therapeutic exercises, 97530- Therapeutic activity, 97112- Neuromuscular re-education, 97535- Self Care, 02859- Manual therapy, [phone]- Gait training, [phone]- Aquatic Therapy, [phone]- Electrical stimulation (unattended), [phone]- Traction (mechanical), [id number]- Ionotophoresis 4mg /ml Dexamethasone , 79439 (1-2 muscles), [id number] (3+ muscles)- Dry Needling, Patient/Family education, Balance training, Taping, Joint mobilization, Joint manipulation, Spinal manipulation, Spinal mobilization, Scar mobilization, Vestibular training, Cryotherapy, Moist heat, and Biofeedback  PLAN FOR NEXT SESSION: progress to core strengthening; abdominal scar tissue mobilization; address decreased thoracolumbar rotation; mobility exercises   Josette Mares, PT, DPT09/08/254:02 PM

## 2024-06-01 ENCOUNTER — Encounter: Payer: Self-pay | Admitting: Obstetrics and Gynecology

## 2024-06-01 ENCOUNTER — Other Ambulatory Visit (HOSPITAL_COMMUNITY)
Admission: RE | Admit: 2024-06-01 | Discharge: 2024-06-01 | Disposition: A | Discharge status: Home / Self Care | Source: Ambulatory Visit | Attending: Obstetrics and Gynecology | Admitting: Obstetrics and Gynecology

## 2024-06-01 ENCOUNTER — Ambulatory Visit (INDEPENDENT_AMBULATORY_CARE_PROVIDER_SITE_OTHER): Payer: Medicare Other | Admitting: Obstetrics and Gynecology

## 2024-06-01 VITALS — BP 110/60 | HR 65 | Ht 68.31 in | Wt 133.4 lb

## 2024-06-01 DIAGNOSIS — Z1331 Encounter for screening for depression: Secondary | ICD-10-CM

## 2024-06-01 DIAGNOSIS — R87612 Low grade squamous intraepithelial lesion on cytologic smear of cervix (LGSIL): Secondary | ICD-10-CM

## 2024-06-01 DIAGNOSIS — Z9189 Other specified personal risk factors, not elsewhere classified: Secondary | ICD-10-CM | POA: Diagnosis not present

## 2024-06-01 DIAGNOSIS — Z1211 Encounter for screening for malignant neoplasm of colon: Secondary | ICD-10-CM

## 2024-06-01 DIAGNOSIS — Z1272 Encounter for screening for malignant neoplasm of vagina: Secondary | ICD-10-CM

## 2024-06-01 DIAGNOSIS — N893 Dysplasia of vagina, unspecified: Secondary | ICD-10-CM | POA: Insufficient documentation

## 2024-06-01 NOTE — Progress Notes (Signed)
 78 y.o. y.o. female here for medicare gyn annual exam. Patient's last menstrual period was 09/22/2000.   history of vain1 2022-medicare (high risk)  HPI: Colpo 08/19/2021 VAIN 1.  HPV 16-18-45 Neg.  H/O Severe Dysplasia of the Cervix for which patient had an LAVH/BSO.  06/01/23 LGSIL PAP-06/01/23 Mammo-03/04/24-BI-RADS CATEGORY  1: Negative DEXA-11/02/23 -1.5 frax 9.3, 1.8% repeat in 2 years Colonoscopy 2019 3 polyps removed. Recommend repeat in 3 years. Referral placed  On estrace  vaginal cream through compounding and is able to use this without burning. Has tried several other estrogen products with no success Is doing pelvic PT and has seen Dr. Guadlupe Has also seen Dr. Viktoria gynonc. Recommends doing HPV DNA genotype with next pap smear Patient reports the burning is better. No pruritus   There is no height or weight on file to calculate BMI.     07/15/2023    9:13 AM  Depression screen PHQ 2/9  Decreased Interest 0  Down, Depressed, Hopeless 0  PHQ - 2 Score 0    Last menstrual period 09/22/2000.     Component Value Date/Time   DIAGPAP - Low grade squamous intraepithelial lesion (LSIL) (A) 06/01/2023 1228   DIAGPAP - Low grade squamous intraepithelial lesion (LSIL) (A) 11/04/2022 0953   DIAGPAP - Low grade squamous intraepithelial lesion (LSIL) (A) 02/27/2022 1018   HPVHIGH Positive (A) 11/04/2022 0953   ADEQPAP Satisfactory for evaluation. 06/01/2023 1228   ADEQPAP  11/04/2022 0953    Satisfactory for evaluation; transformation zone component ABSENT.   ADEQPAP Satisfactory for evaluation. 02/27/2022 1018    GYN HISTORY:    Component Value Date/Time   DIAGPAP - Low grade squamous intraepithelial lesion (LSIL) (A) 06/01/2023 1228   DIAGPAP - Low grade squamous intraepithelial lesion (LSIL) (A) 11/04/2022 0953   DIAGPAP - Low grade squamous intraepithelial lesion (LSIL) (A) 02/27/2022 1018   HPVHIGH Positive (A) 11/04/2022 0953   ADEQPAP Satisfactory for evaluation.  06/01/2023 1228   ADEQPAP  11/04/2022 0953    Satisfactory for evaluation; transformation zone component ABSENT.   ADEQPAP Satisfactory for evaluation. 02/27/2022 1018    OB History  Gravida Para Term Preterm AB Living  1 1 1   1   SAB IAB Ectopic Multiple Live Births      1    # Outcome Date GA Lbr Len/2nd Weight Sex Type Anes PTL Lv  1 Term     F Vag-Spont   LIV    Past Medical History:  Diagnosis Date   Anemia    Basal cell carcinoma    Breast cancer (HCC) 1987   Left breast intraductal; lumpectomy, RT (unsure about receptor status)   Diverticulitis    Endometriosis    diagnosed in the 80s (ovary removed on one side)   GERD (gastroesophageal reflux disease)    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/2019   LGSIL on Pap smear of cervix    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    Nocturnal leg cramps    Osteopenia 11/2018   T score -1.9 asked 9% / 1.4%   Personal history of radiation therapy    PONV (postoperative nausea and vomiting)     Past Surgical History:  Procedure Laterality Date   APPENDECTOMY  1954   BREAST LUMPECTOMY Left 1987   BREAST SURGERY  1987   Lumpectomy   CERVICAL BIOPSY  W/ LOOP ELECTRODE EXCISION     COLONOSCOPY  02/2020   CYSTOSCOPY N/A 05/15/2020  Procedure: CYSTOSCOPY;  Surgeon: Arnaldo Purchase, MD;  Location: [facility];  Service: Gynecology;  Laterality: N/A;   EYE SURGERY     laser to fix torn retina   HAND SURGERY     left hand   LAPAROSCOPIC HYSTERECTOMY N/A 05/15/2020   Procedure: HYSTERECTOMY TOTAL LAPAROSCOPIC;  Surgeon: Arnaldo Purchase, MD;  Location: [facility];  Service: Gynecology;  Laterality: N/A;  request 7:30 am OR time in [place] Gyn block requests two hours   LAPAROSCOPIC SALPINGO OOPHERECTOMY Right 05/15/2020   Procedure: LAPAROSCOPIC SALPINGO OOPHORECTOMY;  Surgeon: Arnaldo Purchase, MD;  Location: [facility];  Service:  Gynecology;  Laterality: Right;   OOPHORECTOMY  1989   LSO   TONSILLECTOMY  1955   TUBAL LIGATION  1984    Current Outpatient Medications on File Prior to Visit  Medication Sig Dispense Refill   Alum & Mag Hydroxide-Simeth (MYLANTA PO) Take by mouth as needed.     aspirin  EC 81 MG tablet Take 1 tablet (81 mg total) by mouth in the morning. Swallow whole.  Restart 1 week after surgery 30 tablet 11   Calcium  Carbonate Antacid (TUMS PO) Take by mouth as needed.     Calcium  Citrate-Vitamin D (CALCIUM  + D PO) Take by mouth in the morning and at bedtime.     Cholecalciferol (VITAMIN D3 PO) Take by mouth.     Cyanocobalamin (B-12 PO) Take 1,000 mg by mouth daily.     estradiol  (ESTRACE ) 0.1 MG/GM vaginal cream Place 1g twice a week 30 g 3   fexofenadine (ALLEGRA) 180 MG tablet Take 180 mg by mouth daily.     IBUPROFEN  PO Take by mouth.     IMVEXXY  MAINTENANCE PACK 4 MCG INST PLACE 1 INSERT VAGINALLY TWICE  WEEKLY 32 each 3   lidocaine  (XYLOCAINE ) 5 % ointment Use 0.5g at the vaginal opening 10-31min prior to PT or intercourse for relief 35.44 g 0   rosuvastatin  (CRESTOR ) 5 MG tablet Take 1 tablet (5 mg total) by mouth daily. 90 tablet 3   SALINE NASAL SPRAY NA Place into the nose.     Wheat Dextrin (BENEFIBER DRINK MIX PO) Take by mouth.     No current facility-administered medications on file prior to visit.    Social History   Socioeconomic History   Marital status: Married    Spouse name: Not on file   Number of children: Not on file   Years of education: Not on file   Highest education level: Not on file  Occupational History   Not on file  Tobacco Use   Smoking status: Former   Smokeless tobacco: Never   Tobacco comments:    in college not much  Vaping Use   Vaping status: Never Used  Substance and Sexual Activity   Alcohol use: No    Alcohol/week: 0.0 standard drinks of alcohol   Drug use: No   Sexual activity: Yes    Partners: Male    Birth control/protection:  Surgical    Comment: 1st intercourse 78 yo-Fewer than 5 partners, hysterectomy  Other Topics Concern   Not on file  Social History Narrative   Not on file   Social Drivers of Health   Financial Resource Strain: Not on file  Food Insecurity: No Food Insecurity (07/15/2023)   Hunger Vital Sign    Worried About Running Out of Food in the Last Year: Never true    Ran Out of Food in the Last Year: Never  true  Transportation Needs: No Transportation Needs (07/15/2023)   PRAPARE - Administrator, Civil Service (Medical): No    Lack of Transportation (Non-Medical): No  Physical Activity: Not on file  Stress: Not on file  Social Connections: Not on file  Intimate Partner Violence: Not At Risk (07/15/2023)   Humiliation, Afraid, Rape, and Kick questionnaire    Fear of Current or Ex-Partner: No    Emotionally Abused: No    Physically Abused: No    Sexually Abused: No    Family History  Problem Relation Age of Onset   Hypertension Mother    Ovarian cancer Mother        dx a month before her death, metastatic   Colon cancer Mother    Other Mother        Female organ prolapse - bladder   Heart disease Father    Breast cancer Paternal Grandmother        Age 85's   Endometrial cancer Neg Hx    Prostate cancer Neg Hx    Pancreatic cancer Neg Hx    Uterine cancer Neg Hx    Bladder Cancer Neg Hx    Renal cancer Neg Hx      Allergies  Allergen Reactions   Conjugated Estrogens      Premarin  cream vaginal burning   Sulfa Antibiotics Other (See Comments)    Reaction unknown   Adhesive [Tape] Rash   Latex Rash      Patient's last menstrual period was Patient's last menstrual period was 09/22/2000..           Review of Systems Alls systems reviewed and are negative.     Physical Exam Constitutional:      Appearance: Normal appearance.  Genitourinary:     Vulva normal.     No lesions in the vagina.     Right Labia: No rash, lesions or skin changes.    Left  Labia: No lesions, skin changes or rash.    Vaginal cuff intact.    No vaginal discharge or tenderness.     No vaginal prolapse present.    Mild vaginal atrophy present.     Right Adnexa: absent.    Left Adnexa: absent.    Cervix is absent.     Uterus is absent.  Breasts:    Right: Normal.     Left: Normal.  HENT:     Head: Normocephalic.  Neck:     Thyroid: No thyroid mass, thyromegaly or thyroid tenderness.  Cardiovascular:     Rate and Rhythm: Normal rate and regular rhythm.     Heart sounds: Normal heart sounds, S1 normal and S2 normal.  Pulmonary:     Effort: Pulmonary effort is normal.     Breath sounds: Normal breath sounds and air entry.  Abdominal:     General: Bowel sounds are normal. There is no distension.     Palpations: Abdomen is soft. There is no mass.     Tenderness: There is no abdominal tenderness. There is no guarding or rebound.  Musculoskeletal:     Cervical back: Full passive range of motion without pain, normal range of motion and neck supple. No tenderness.     Right lower leg: No edema.     Left lower leg: No edema.  Neurological:     Mental Status: She is alert.  Skin:    General: Skin is warm.  Psychiatric:        Mood and Affect: Mood  normal.        Behavior: Behavior normal.        Thought Content: Thought content normal.  Vitals and nursing note reviewed. Exam conducted with a chaperone present.    Geni, CMA, was present for the entire exam   A:         Well Woman medicare high risk GYN exam H/o VAIN LGSIL pap smears osteopenia                             P:        Pap smear collected today Encouraged annual mammogram screening Colon cancer screening referral placed today DXA up-to-date denies any fractures or new fractures. Taking calcium  and vit D Labs and immunizations to do with PMD Discussed breast self exams Encouraged healthy lifestyle practices Encouraged Vit D and Calcium    No follow-ups on file.  Christina Edwards

## 2024-06-06 ENCOUNTER — Ambulatory Visit: Payer: Self-pay | Admitting: Obstetrics and Gynecology

## 2024-06-06 LAB — CYTOLOGY - PAP: Adequacy: ABSENT

## 2024-06-07 NOTE — Progress Notes (Signed)
 Spoke with pt made her aware of the results of the pap. She requested that a copy of the results be mailed to her. I verified her addressed, printed the results and placed it at the front in the out going mail.

## 2024-06-08 ENCOUNTER — Ambulatory Visit: Payer: Self-pay | Admitting: Obstetrics and Gynecology

## 2024-06-08 ENCOUNTER — Encounter: Payer: Self-pay | Admitting: Family Medicine

## 2024-07-04 ENCOUNTER — Ambulatory Visit

## 2024-07-11 ENCOUNTER — Ambulatory Visit: Attending: Obstetrics

## 2024-07-11 DIAGNOSIS — R279 Unspecified lack of coordination: Secondary | ICD-10-CM | POA: Insufficient documentation

## 2024-07-11 DIAGNOSIS — M6281 Muscle weakness (generalized): Secondary | ICD-10-CM | POA: Diagnosis present

## 2024-07-11 NOTE — Therapy (Signed)
 OUTPATIENT PHYSICAL THERAPY FEMALE PELVIC TREATMENT   Patient Name: Christina Edwards MRN: [id number] DOB:[birth year], 78 y.o., female Today's Date: 07/11/2024  END OF SESSION:  PT End of Session - 07/11/24 1534     Visit Number 4    Date for Recertification  08/04/24    Authorization Type [employer]    Authorization Time Period 05/12/2024-08/04/2024    Authorization - Visit Number 3    Authorization - Number of Visits 8    Progress Note Due on Visit 10    PT Start Time 1531    PT Stop Time 1610    PT Time Calculation (min) 39 min    Activity Tolerance Patient tolerated treatment well    Behavior During Therapy WFL for tasks assessed/performed           Past Medical History:  Diagnosis Date   Anemia    Basal cell carcinoma    Breast cancer (HCC) 1987   Left breast intraductal; lumpectomy, RT (unsure about receptor status)   Diverticulitis    Endometriosis    diagnosed in the 80s (ovary removed on one side)   GERD (gastroesophageal reflux disease)    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/2019   LGSIL on Pap smear of cervix    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    Nocturnal leg cramps    Osteopenia 11/2018   T score -1.9 asked 9% / 1.4%   Personal history of radiation therapy    PONV (postoperative nausea and vomiting)    Past Surgical History:  Procedure Laterality Date   APPENDECTOMY  1954   BREAST LUMPECTOMY Left 1987   BREAST SURGERY  1987   Lumpectomy   CERVICAL BIOPSY  W/ LOOP ELECTRODE EXCISION     COLONOSCOPY  02/2020   CYSTOSCOPY N/A 05/15/2020   Procedure: CYSTOSCOPY;  Surgeon: Arnaldo Purchase, MD;  Location: [facility];  Service: Gynecology;  Laterality: N/A;   EYE SURGERY     laser to fix torn retina   HAND SURGERY     left hand   LAPAROSCOPIC HYSTERECTOMY N/A 05/15/2020   Procedure: HYSTERECTOMY TOTAL LAPAROSCOPIC;  Surgeon: Arnaldo Purchase, MD;  Location[facility];  Service: Gynecology;  Laterality: N/A;  request 7:30 am OR time in [place] [facility] block requests two hours   LAPAROSCOPIC SALPINGO OOPHERECTOMY Right 05/15/2020   Procedure: LAPAROSCOPIC SALPINGO OOPHORECTOMY;  Surgeon: Arnaldo Purchase, MD;  Location: [facility];  Service: Gynecology;  Laterality: Right;   OOPHORECTOMY  1989   LSO   TONSILLECTOMY  1955   TUBAL LIGATION  1984   Patient Active Problem List   Diagnosis Date Noted   Dyspareunia, female 04/19/2024   Vaginal atrophy 01/19/2024   Stress incontinence 01/19/2024   Feeling of incomplete bladder emptying 01/19/2024   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 01/19/2024   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   Constipation 01/19/2024   Abnormal urinalysis 01/19/2024   CIN I (cervical intraepithelial neoplasia I) 05/26/2019   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   Pure hypercholesterolemia 05/24/2019   Coronary artery disease involving native coronary artery of native heart without angina pectoris 02/21/2019   Chest pain 02/04/2019   History of breast cancer 02/04/2019   Abnormal Pap smear, low grade squamous intraepithelial lesion (LGSIL) 04/26/2013   High grade squamous intraepithelial lesion (HGSIL), grade 2 CIN, on biopsy of cervix 09/10/2012   GERD (gastroesophageal reflux disease)  Endometriosis    Osteopenia    Breast cancer (HCC)     PCP: Claudene Pellet, MD  REFERRING PROVIDER: Guadlupe Lianne DASEN, MD   REFERRING DIAG: N39.3 (ICD-10-CM) - Stress incontinence N95.2 (ICD-10-CM) - Vaginal atrophy R39.14 (ICD-10-CM) - Feeling of incomplete bladder emptying  THERAPY DIAG:  Muscle weakness (generalized)  Unspecified lack of coordination  Rationale for Evaluation and Treatment: Rehabilitation  ONSET DATE: 06/2023  SUBJECTIVE:                                                                                                                                                                                            SUBJECTIVE STATEMENT: Pt states that she was doing very well. However, yesterday she started having vaginal burning that is constant when she woke up. She has been very careful not to use soap. She did use coconut oil and it didn't make anything worse. It is better today.    PAIN: 07/11/24 Are you having pain? Yes NPRS scale: 8-9/10 yesterday, 7/10 Pain location: Vaginal  Pain type: burning Pain description: intermittent   Aggravating factors: vaginal medication and moisturizers  Relieving factors: not putting anything in vaginal canal   PRECAUTIONS: Other: history of radiation therapy for breast cancer  RED FLAGS: None   WEIGHT BEARING RESTRICTIONS: No  FALLS:  Has patient fallen in last 6 months? No  OCCUPATION: retired Child psychotherapist  ACTIVITY LEVEL : walking daily  PLOF: Independent  PATIENT GOALS: decrease vaginal pain  PERTINENT HISTORY:  Laparoscopic hysterectomy 2021, oophorectomy 1989, tubal ligation 1984, osteopenia, history of radiation/breast cancer 1987/2012, endometriosis, diverticulitis, GERD, vulvodynia, stage 2 rectocele, chronic constipation  Sexual abuse: No  BOWEL MOVEMENT: Pain with bowel movement: No Type of bowel movement:Type (Bristol Stool Scale) 4, Frequency doing better - 1x/day usually, and Strain sometimes Fully empty rectum: Yes: sometimes  Leakage: No Pads: No Fiber supplement/laxative Yes - fiber  URINATION: Pain with urination: No Fully empty bladder: Yes:   Stream: may have to double void Urgency: Yes  Frequency: every 2 hours; sometimes will wake up 1x/night Fluid Intake: 2-3 bottles of water a day; cup of tea in the morning; occasional diet coke Leakage: very occasional with coughing Pads: Yes: panty liner when she leaves the house (doctor has instructed her not to due to irritation)  INTERCOURSE:  Ability to have vaginal penetration Yes  Pain with intercourse:  none DrynessNo Climax: WNL Marinoff Scale: 0/3 Lubricant: yes  PREGNANCY: Vaginal deliveries 1   PROLAPSE: None   OBJECTIVE:  Note: Objective measures were completed at Evaluation unless otherwise noted.  05/12/24 PATIENT SURVEYS:   PFIQ-7: 38  COGNITION: Overall  cognitive status: Within functional limits for tasks assessed     SENSATION: Light touch: Appears intact   FUNCTIONAL TESTS:  Squat: WNL Single leg stance:  Rt: stable  Lt: stable Curl-up test: WNL   GAIT: Assistive device utilized: none Comments: WNL  POSTURE: rounded shoulders, forward head, decreased lumbar lordosis, and posterior pelvic tilt   LUMBARAROM/PROM:  A/PROM A/PROM  Eval (% available)  Flexion 75  Extension 100  Right lateral flexion 100   Left lateral flexion 100  Right rotation 25  Left rotation 25   (Blank rows = not tested)  PALPATION:   General: WNL  Pelvic Alignment: Posterior pelvic tilt  Abdominal: decreased sternocostal angle; scar tissue restriction most notable in Rt lower quadrant; apical breathing pattern                External Perineal Exam: dryness, pale, red                             Internal Pelvic Floor: burning, not severe; most vaginal canal stenosis  Patient confirms identification and approves PT to assess internal pelvic floor and treatment Yes  PELVIC MMT:   MMT eval  Vaginal 1/5, 1 second hold, 4 repeat contractions; poor coordination  Diastasis Recti WNL  (Blank rows = not tested)        TONE: low  PROLAPSE: Grade 1 anterior vaginal wall prolapse  TODAY'S TREATMENT:                                                                                                                              DATE:  07/11/24 Neuromuscular re-education: Bridge with hip adduction, transversus abdominus, and pelvic floor muscle 2 x 10 Supine unilateral resisted hip abduction green band 10x bil Supine resisted march green band 2 x 10 Supine leg  extensions 10x Seated horizontal abduction green band 2 x 10 Seated resisted trunk rotation green band 2 x 10 Exercises: Lower trunk rotation 2 x 10 Butterfly 10 breaths    05/30/24 Neuromuscular re-education: Bridge with hip adduction, transversus abdominus, and pelvic floor muscle 2 x 10 Supine lower trunk rotation + LE on red ball 3 x 10 Supine chop + LE on red ball + 3 lbs 2 x 10 bil Supine full shoulder flexion + 3 lbs + LE on red band 2 x 10 Seated hip adduction ball press with transversus abdominus and pelvic floor muscle 2 x 10 Seated hip abduction red band with transversus abdominus and pelvic floor muscle 2 x 10 Seated resisted march red band with transversus abdominus and pelvic floor muscle 2 x 10 Therapeutic activities: Standing bil shoulder extension + green band 2 x 10 Sit<>stand to table 2 x 10   05/25/24 Manual: Supported supine lower abdominal scar tissue mobilization  Neuromuscular re-education: Transversus abdominus training with multimodal cues for improved motor control and breath coordination Bil supine UE ball press with transversus  abdominus and pelvic floor muscle contractions and breath coordination 10x Supine hip adduction ball press with transversus abdominus and pelvic floor muscle contractions and breath coordination 10x Bridge with hip adduction, transversus abdominus, and pelvic floor muscle 2 x 10 Supine unilateral bent knee fall out + red band 10x bil Supine resisted march + red band 2 x 10 Exercises: Supine lower trunk rotation 2 x 10 Open books 10x bil  PATIENT EDUCATION:  Education details: See above Person educated: Patient Education method: Programmer, multimedia, Demonstration, Tactile cues, Verbal cues, and Handouts Education comprehension: verbalized understanding  HOME EXERCISE PROGRAM: [id number]  ASSESSMENT:  CLINICAL IMPRESSION: Patient is a 78 y.o. female who was seen today for physical therapy treatment for vaginal pain and burning. Pt  was doing very well, but had large increase in pain when she woke up yesterday morning. She was encouraged to continue use of coconut oil, but maybe increase to 2-3x a day. She was also able to return to exercise today and after first exercise she noticed improved burning. Due to this, we discussed how exercises increase blood flow and mobility of vaginal tissues which may improve pain. She did very well wit hall exercises and reported reduction in pain to 4/10 from 7/10. She will continue to benefit from skilled PT intervention in order to decrease vaginal pain, address impairments, and improve quality of life.   OBJECTIVE IMPAIRMENTS: decreased activity tolerance, decreased coordination, decreased endurance, decreased mobility, decreased ROM, decreased strength, increased fascial restrictions, increased muscle spasms, impaired flexibility, impaired tone, improper body mechanics, postural dysfunction, and pain.   ACTIVITY LIMITATIONS: vaginal pain  PARTICIPATION LIMITATIONS: cleaning, community activity, and exercise  PERSONAL FACTORS: 1 comorbidity: medical history are also affecting patient's functional outcome.   REHAB POTENTIAL: Good  CLINICAL DECISION MAKING: Stable/uncomplicated  EVALUATION COMPLEXITY: Low   GOALS: Goals reviewed with patient? Yes  SHORT TERM GOALS: Target date: 06/09/2024   Pt will be independent with HEP in order to improve activity tolerance.   Baseline: Goal status: MET 07/11/24  2.  Patient will report 25% improve in vaginal pain in order to increase activity tolerance.    Baseline: 9/10; was doing much better, but had flare up of pain yesterday to 9/10 Goal status: IN PROGRESS 07/11/24  3.  Pt will increase pelvic floor muscle strength and coordination to 2/5 in order to improve vaginal canal mobility that will help decrease pain.   Baseline: 1/5 Goal status:  IN PROGRESS 07/11/24  4.  Pt will be independent with regular vaginal moisturizing to help  decrease pain.  Baseline:  Goal status: MET 07/11/24   LONG TERM GOALS: Target date: 08/04/2025  Pt will be independent with advanced HEP in order to improve activity tolerance.   Baseline:  Goal status:  IN PROGRESS 07/11/24  2.  Patient will report 75% improve in vaginal pain in order to increase activity tolerance.   Baseline: 9/10 Goal status:  IN PROGRESS 07/11/24  3.  Pt will increase pelvic floor muscle strength and coordination to 3/5 in order to improve vaginal canal mobility that will help decrease pain.  Baseline:  Goal status:  IN PROGRESS 07/11/24  4.  Pt will increase pelvic floor muscle endurance to >10 seconds in order to improve pelvic floor muscle circulation and abdominal support that will help decrease pain.  Baseline:  Goal status:  IN PROGRESS 07/11/24    PLAN:  PT FREQUENCY: 1-2x/week  PT DURATION: 8 visits    PLANNED INTERVENTIONS: [id number]- PT Re-evaluation, 97110-Therapeutic exercises, 97530-  Therapeutic activity, [id number]- Neuromuscular re-education, [phone]- Self Care, [id number]- Manual therapy, [phone]- Gait training, [phone]- Aquatic Therapy, [phone]- Electrical stimulation (unattended), [phone]- Traction (mechanical), [phone]- Ionotophoresis 4mg /ml Dexamethasone , 20560 (1-2 muscles), 20561 (3+ muscles)- Dry Needling, Patient/Family education, Balance training, Taping, Joint mobilization, Joint manipulation, Spinal manipulation, Spinal mobilization, Scar mobilization, Vestibular training, Cryotherapy, Moist heat, and Biofeedback  PLAN FOR NEXT SESSION: progress to core strengthening; abdominal scar tissue mobilization; address decreased thoracolumbar rotation; mobility exercises   Josette Mares, PT, DPT10/20/254:10 PM

## 2024-07-21 ENCOUNTER — Ambulatory Visit

## 2024-07-21 DIAGNOSIS — R279 Unspecified lack of coordination: Secondary | ICD-10-CM

## 2024-07-21 DIAGNOSIS — M6281 Muscle weakness (generalized): Secondary | ICD-10-CM | POA: Diagnosis not present

## 2024-07-21 NOTE — Therapy (Signed)
 OUTPATIENT PHYSICAL THERAPY FEMALE PELVIC TREATMENT   Patient Name: Christina Edwards MRN: [id number] DOB:13-Nov-1945, 78 y.o., female Today's Date: 07/21/2024  END OF SESSION:  PT End of Session - 07/21/24 1148     Visit Number 5    Date for Recertification  08/04/24    Authorization Type [employer]    Authorization Time Period 05/12/2024-08/04/2024    Authorization - Visit Number 4    Authorization - Number of Visits 8    Progress Note Due on Visit 10    PT Start Time 1147    PT Stop Time 1227    PT Time Calculation (min) 40 min    Activity Tolerance Patient tolerated treatment well    Behavior During Therapy [facility] for tasks assessed/performed           Past Medical History:  Diagnosis Date   Anemia    Basal cell carcinoma    Breast cancer (HCC) 1987   Left breast intraductal; lumpectomy, RT (unsure about receptor status)   Diverticulitis    Endometriosis    diagnosed in the 80s (ovary removed on one side)   GERD (gastroesophageal reflux disease)    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/2019   LGSIL on Pap smear of cervix    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    Nocturnal leg cramps    Osteopenia 11/2018   T score -1.9 asked 9% / 1.4%   Personal history of radiation therapy    PONV (postoperative nausea and vomiting)    Past Surgical History:  Procedure Laterality Date   APPENDECTOMY  1954   BREAST LUMPECTOMY Left 1987   BREAST SURGERY  1987   Lumpectomy   CERVICAL BIOPSY  W/ LOOP ELECTRODE EXCISION     COLONOSCOPY  02/2020   CYSTOSCOPY N/A 05/15/2020   Procedure: CYSTOSCOPY;  Surgeon: Arnaldo Purchase, MD;  Location: [facility] [place];  Service: Gynecology;  Laterality: N/A;   EYE SURGERY     laser to fix torn retina   HAND SURGERY     left hand   LAPAROSCOPIC HYSTERECTOMY N/A 05/15/2020   Procedure: HYSTERECTOMY TOTAL LAPAROSCOPIC;  Surgeon: Arnaldo Purchase, MD;  [facility];  Service: Gynecology;  Laterality: N/A;  request 7:30 am OR time in [place] [facility] block requests two hours   LAPAROSCOPIC SALPINGO OOPHERECTOMY Right 05/15/2020   Procedure: LAPAROSCOPIC SALPINGO OOPHORECTOMY;  Surgeon: Arnaldo Purchase, MD;  Location: [facility];  Service: Gynecology;  Laterality: Right;   OOPHORECTOMY  1989   LSO   TONSILLECTOMY  1955   TUBAL LIGATION  1984   Patient Active Problem List   Diagnosis Date Noted   Dyspareunia, female 04/19/2024   Vaginal atrophy 01/19/2024   Stress incontinence 01/19/2024   Feeling of incomplete bladder emptying 01/19/2024   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 01/19/2024   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   Constipation 01/19/2024   Abnormal urinalysis 01/19/2024   CIN I (cervical intraepithelial neoplasia I) 05/26/2019   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   Pure hypercholesterolemia 05/24/2019   Coronary artery disease involving native coronary artery of native heart without angina pectoris 02/21/2019   Chest pain 02/04/2019   History of breast cancer 02/04/2019   Abnormal Pap smear, low grade squamous intraepithelial lesion (LGSIL) 04/26/2013   High grade squamous intraepithelial lesion (HGSIL), grade 2 CIN, on biopsy of cervix 09/10/2012   GERD (gastroesophageal reflux disease)  Endometriosis    Osteopenia    Breast cancer (HCC)     PCP: Claudene Pellet, MD  REFERRING PROVIDER: Guadlupe Lianne DASEN, MD   REFERRING DIAG: N39.3 (ICD-10-CM) - Stress incontinence N95.2 (ICD-10-CM) - Vaginal atrophy R39.14 (ICD-10-CM) - Feeling of incomplete bladder emptying  THERAPY DIAG:  Muscle weakness (generalized)  Unspecified lack of coordination  Rationale for Evaluation and Treatment: Rehabilitation  ONSET DATE: 06/2023  SUBJECTIVE:                                                                                                                                                                                            SUBJECTIVE STATEMENT: Pt states that she has had a much better week, but she did have painful day this Tuesday 2 days ago. She is wondering if this could be from not performing exercises the day before. She is also using baby wipes.    PAIN: 07/21/24 Are you having pain? Yes NPRS scale: 3/10 Pain location: Vaginal  Pain type: burning Pain description: intermittent   Aggravating factors: vaginal medication and moisturizers  Relieving factors: not putting anything in vaginal canal   PRECAUTIONS: Other: history of radiation therapy for breast cancer  RED FLAGS: None   WEIGHT BEARING RESTRICTIONS: No  FALLS:  Has patient fallen in last 6 months? No  OCCUPATION: retired child psychotherapist  ACTIVITY LEVEL : walking daily  PLOF: Independent  PATIENT GOALS: decrease vaginal pain  PERTINENT HISTORY:  Laparoscopic hysterectomy 2021, oophorectomy 1989, tubal ligation 1984, osteopenia, history of radiation/breast cancer 1987/2012, endometriosis, diverticulitis, GERD, vulvodynia, stage 2 rectocele, chronic constipation  Sexual abuse: No  BOWEL MOVEMENT: Pain with bowel movement: No Type of bowel movement:Type (Bristol Stool Scale) 4, Frequency doing better - 1x/day usually, and Strain sometimes Fully empty rectum: Yes: sometimes  Leakage: No Pads: No Fiber supplement/laxative Yes - fiber  URINATION: Pain with urination: No Fully empty bladder: Yes:   Stream: may have to double void Urgency: Yes  Frequency: every 2 hours; sometimes will wake up 1x/night Fluid Intake: 2-3 bottles of water a day; cup of tea in the morning; occasional diet coke Leakage: very occasional with coughing Pads: Yes: panty liner when she leaves the house (doctor has instructed her not to due to irritation)  INTERCOURSE:  Ability to have vaginal penetration Yes  Pain with intercourse: none DrynessNo Climax: WNL Marinoff Scale: 0/3 Lubricant:  yes  PREGNANCY: Vaginal deliveries 1   PROLAPSE: None   OBJECTIVE:  Note: Objective measures were completed at Evaluation unless otherwise noted.  05/12/24 PATIENT SURVEYS:   PFIQ-7: 38  COGNITION: Overall cognitive status: Within functional limits for tasks  assessed     SENSATION: Light touch: Appears intact   FUNCTIONAL TESTS:  Squat: WNL Single leg stance:  Rt: stable  Lt: stable Curl-up test: WNL   GAIT: Assistive device utilized: none Comments: WNL  POSTURE: rounded shoulders, forward head, decreased lumbar lordosis, and posterior pelvic tilt   LUMBARAROM/PROM:  A/PROM A/PROM  Eval (% available)  Flexion 75  Extension 100  Right lateral flexion 100   Left lateral flexion 100  Right rotation 25  Left rotation 25   (Blank rows = not tested)  PALPATION:   General: WNL  Pelvic Alignment: Posterior pelvic tilt  Abdominal: decreased sternocostal angle; scar tissue restriction most notable in Rt lower quadrant; apical breathing pattern                External Perineal Exam: dryness, pale, red                             Internal Pelvic Floor: burning, not severe; most vaginal canal stenosis  Patient confirms identification and approves PT to assess internal pelvic floor and treatment Yes  PELVIC MMT:   MMT eval  Vaginal 1/5, 1 second hold, 4 repeat contractions; poor coordination  Diastasis Recti WNL  (Blank rows = not tested)        TONE: low  PROLAPSE: Grade 1 anterior vaginal wall prolapse  TODAY'S TREATMENT:                                                                                                                              DATE:  07/21/24 Neuromuscular re-education: Seated horizontal abduction green band 2 x 10 Seated resisted trunk rotation green band 2 x 10 Seated red ball press 2 x 10 Seated red ball 2 x 10 Red ball up wall 12x Therapeutic activities: Using water wipes instead of other baby wipes to decrease  irritation Vulvar balm instead of coconut oil: recommended medicine mama  Standing rows 2 x 10 green band Standing shoulder extension green band 2 x 10 Squats to elevated table 2 x 10 Standing 3 way kick 10x each bil   07/11/24 Neuromuscular re-education: Bridge with hip adduction, transversus abdominus, and pelvic floor muscle 2 x 10 Supine unilateral resisted hip abduction green band 10x bil Supine resisted march green band 2 x 10 Supine leg extensions 10x Seated horizontal abduction green band 2 x 10 Seated resisted trunk rotation green band 2 x 10 Exercises: Lower trunk rotation 2 x 10 Butterfly 10 breaths    05/30/24 Neuromuscular re-education: Bridge with hip adduction, transversus abdominus, and pelvic floor muscle 2 x 10 Supine lower trunk rotation + LE on red ball 3 x 10 Supine chop + LE on red ball + 3 lbs 2 x 10 bil Supine full shoulder flexion + 3 lbs + LE on red band 2 x 10 Seated hip adduction ball press with transversus abdominus and pelvic floor muscle  2 x 10 Seated hip abduction red band with transversus abdominus and pelvic floor muscle 2 x 10 Seated resisted march red band with transversus abdominus and pelvic floor muscle 2 x 10 Therapeutic activities: Standing bil shoulder extension + green band 2 x 10 Sit<>stand to table 2 x 10     PATIENT EDUCATION:  Education details: See above Person educated: Patient Education method: Explanation, Demonstration, Tactile cues, Verbal cues, and Handouts Education comprehension: verbalized understanding  HOME EXERCISE PROGRAM: [id number]  ASSESSMENT:  CLINICAL IMPRESSION: Patient is a 78 y.o. female who was seen today for physical therapy treatment for vaginal pain and burning. Pt doing better with vaginal pain this week, but did have one day with increase in pain. Due to use of wipes, we discussed water wipes to help decrease any irritation and using a vulvar balm instead of coconut oil to see if vaginal pain does  not improve further. She did well with all exercise progressions to standing positions and more functional training. HEP updated. She will continue to benefit from skilled PT intervention in order to decrease vaginal pain, address impairments, and improve quality of life.   OBJECTIVE IMPAIRMENTS: decreased activity tolerance, decreased coordination, decreased endurance, decreased mobility, decreased ROM, decreased strength, increased fascial restrictions, increased muscle spasms, impaired flexibility, impaired tone, improper body mechanics, postural dysfunction, and pain.   ACTIVITY LIMITATIONS: vaginal pain  PARTICIPATION LIMITATIONS: cleaning, community activity, and exercise  PERSONAL FACTORS: 1 comorbidity: medical history are also affecting patient's functional outcome.   REHAB POTENTIAL: Good  CLINICAL DECISION MAKING: Stable/uncomplicated  EVALUATION COMPLEXITY: Low   GOALS: Goals reviewed with patient? Yes  SHORT TERM GOALS: Target date: 06/09/2024   Pt will be independent with HEP in order to improve activity tolerance.   Baseline: Goal status: MET 07/11/24  2.  Patient will report 25% improve in vaginal pain in order to increase activity tolerance.    Baseline: 9/10; was doing much better, but had flare up of pain yesterday to 9/10 Goal status: IN PROGRESS 07/11/24  3.  Pt will increase pelvic floor muscle strength and coordination to 2/5 in order to improve vaginal canal mobility that will help decrease pain.   Baseline: 1/5 Goal status:  IN PROGRESS 07/11/24  4.  Pt will be independent with regular vaginal moisturizing to help decrease pain.  Baseline:  Goal status: MET 07/11/24   LONG TERM GOALS: Target date: 08/04/2025  Pt will be independent with advanced HEP in order to improve activity tolerance.   Baseline:  Goal status:  IN PROGRESS 07/11/24  2.  Patient will report 75% improve in vaginal pain in order to increase activity tolerance.   Baseline:  9/10 Goal status:  IN PROGRESS 07/11/24  3.  Pt will increase pelvic floor muscle strength and coordination to 3/5 in order to improve vaginal canal mobility that will help decrease pain.  Baseline:  Goal status:  IN PROGRESS 07/11/24  4.  Pt will increase pelvic floor muscle endurance to >10 seconds in order to improve pelvic floor muscle circulation and abdominal support that will help decrease pain.  Baseline:  Goal status:  IN PROGRESS 07/11/24    PLAN:  PT FREQUENCY: 1-2x/week  PT DURATION: 8 visits    PLANNED INTERVENTIONS: [id number]- PT Re-evaluation, 97110-Therapeutic exercises, 97530- Therapeutic activity, 97112- Neuromuscular re-education, 97535- Self Care, 02859- Manual therapy, [phone]- Gait training, [phone]- Aquatic Therapy, [phone]- Electrical stimulation (unattended), [phone]- Traction (mechanical), [id number]- Ionotophoresis 4mg /ml Dexamethasone , 79439 (1-2 muscles), 20561 (3+ muscles)- Dry Needling, Patient/Family education,  Balance training, Taping, Joint mobilization, Joint manipulation, Spinal manipulation, Spinal mobilization, Scar mobilization, Vestibular training, Cryotherapy, Moist heat, and Biofeedback  PLAN FOR NEXT SESSION: progress to core strengthening; abdominal scar tissue mobilization; address decreased thoracolumbar rotation; mobility exercises   Josette Mares, PT, DPT10/30/2512:19 PM

## 2024-07-25 ENCOUNTER — Ambulatory Visit: Attending: Obstetrics

## 2024-07-25 DIAGNOSIS — R279 Unspecified lack of coordination: Secondary | ICD-10-CM | POA: Insufficient documentation

## 2024-07-25 DIAGNOSIS — M6281 Muscle weakness (generalized): Secondary | ICD-10-CM | POA: Diagnosis present

## 2024-07-25 NOTE — Therapy (Signed)
 OUTPATIENT PHYSICAL THERAPY FEMALE PELVIC TREATMENT   Patient Name: Christina Edwards MRN: [id number] DOB:[birth year], 78 y.o., female Today's Date: 07/25/2024  END OF SESSION:  PT End of Session - 07/25/24 1057     Visit Number 6    Date for Recertification  08/04/24    Authorization Type [employer]    Authorization Time Period 05/12/2024-08/04/2024    Authorization - Visit Number 5    Authorization - Number of Visits 8    Progress Note Due on Visit 10    PT Start Time 1100    PT Stop Time 1138    PT Time Calculation (min) 38 min    Activity Tolerance Patient tolerated treatment well    Behavior During Therapy WFL for tasks assessed/performed           Past Medical History:  Diagnosis Date   Anemia    Basal cell carcinoma    Breast cancer (HCC) 1987   Left breast intraductal; lumpectomy, RT (unsure about receptor status)   Diverticulitis    Endometriosis    diagnosed in the 80s (ovary removed on one side)   GERD (gastroesophageal reflux disease)    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/2019   LGSIL on Pap smear of cervix    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    Nocturnal leg cramps    Osteopenia 11/2018   T score -1.9 asked 9% / 1.4%   Personal history of radiation therapy    PONV (postoperative nausea and vomiting)    Past Surgical History:  Procedure Laterality Date   APPENDECTOMY  1954   BREAST LUMPECTOMY Left 1987   BREAST SURGERY  1987   Lumpectomy   CERVICAL BIOPSY  W/ LOOP ELECTRODE EXCISION     COLONOSCOPY  02/2020   CYSTOSCOPY N/A 05/15/2020   Procedure: CYSTOSCOPY;  Surgeon: Arnaldo Purchase, MD;  Location: [facility];  Service: Gynecology;  Laterality: N/A;   EYE SURGERY     laser to fix torn retina   HAND SURGERY     left hand   LAPAROSCOPIC HYSTERECTOMY N/A 05/15/2020   Procedure: HYSTERECTOMY TOTAL LAPAROSCOPIC;  Surgeon: Arnaldo Purchase, MD;  [facility];  Service: Gynecology;  Laterality: N/A;  request 7:30 am OR time in [place] [facility] block requests two hours   LAPAROSCOPIC SALPINGO OOPHERECTOMY Right 05/15/2020   Procedure: LAPAROSCOPIC SALPINGO OOPHORECTOMY;  Surgeon: Arnaldo Purchase, MD;  Location: [facility];  Service: Gynecology;  Laterality: Right;   OOPHORECTOMY  1989   LSO   TONSILLECTOMY  1955   TUBAL LIGATION  1984   Patient Active Problem List   Diagnosis Date Noted   Dyspareunia, female 04/19/2024   Vaginal atrophy 01/19/2024   Stress incontinence 01/19/2024   Feeling of incomplete bladder emptying 01/19/2024   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 01/19/2024   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   Constipation 01/19/2024   Abnormal urinalysis 01/19/2024   CIN I (cervical intraepithelial neoplasia I) 05/26/2019   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   Pure hypercholesterolemia 05/24/2019   Coronary artery disease involving native coronary artery of native heart without angina pectoris 02/21/2019   Chest pain 02/04/2019   History of breast cancer 02/04/2019   Abnormal Pap smear, low grade squamous intraepithelial lesion (LGSIL) 04/26/2013   High grade squamous intraepithelial lesion (HGSIL), grade 2 CIN, on biopsy of cervix 09/10/2012   GERD (gastroesophageal reflux disease)  Endometriosis    Osteopenia    Breast cancer (HCC)     PCP: Claudene Pellet, MD  REFERRING PROVIDER: Guadlupe Lianne DASEN, MD   REFERRING DIAG: N39.3 (ICD-10-CM) - Stress incontinence N95.2 (ICD-10-CM) - Vaginal atrophy R39.14 (ICD-10-CM) - Feeling of incomplete bladder emptying  THERAPY DIAG:  Muscle weakness (generalized)  Unspecified lack of coordination  Rationale for Evaluation and Treatment: Rehabilitation  ONSET DATE: 06/2023  SUBJECTIVE:                                                                                                                                                                                            SUBJECTIVE STATEMENT: Pt states that she feels like coconut oil still irritates her now and she has had more persistent vaginal burning. She does feel like when she performs any exercises it helps reduce burning.    PAIN: 07/25/24 Are you having pain? Yes NPRS scale: 3-4/10 Pain location: Vaginal  Pain type: burning Pain description: intermittent   Aggravating factors: vaginal medication and moisturizers  Relieving factors: not putting anything in vaginal canal   PRECAUTIONS: Other: history of radiation therapy for breast cancer  RED FLAGS: None   WEIGHT BEARING RESTRICTIONS: No  FALLS:  Has patient fallen in last 6 months? No  OCCUPATION: retired child psychotherapist  ACTIVITY LEVEL : walking daily  PLOF: Independent  PATIENT GOALS: decrease vaginal pain  PERTINENT HISTORY:  Laparoscopic hysterectomy 2021, oophorectomy 1989, tubal ligation 1984, osteopenia, history of radiation/breast cancer 1987/2012, endometriosis, diverticulitis, GERD, vulvodynia, stage 2 rectocele, chronic constipation  Sexual abuse: No  BOWEL MOVEMENT: Pain with bowel movement: No Type of bowel movement:Type (Bristol Stool Scale) 4, Frequency doing better - 1x/day usually, and Strain sometimes Fully empty rectum: Yes: sometimes  Leakage: No Pads: No Fiber supplement/laxative Yes - fiber  URINATION: Pain with urination: No Fully empty bladder: Yes:   Stream: may have to double void Urgency: Yes  Frequency: every 2 hours; sometimes will wake up 1x/night Fluid Intake: 2-3 bottles of water a day; cup of tea in the morning; occasional diet coke Leakage: very occasional with coughing Pads: Yes: panty liner when she leaves the house (doctor has instructed her not to due to irritation)  INTERCOURSE:  Ability to have vaginal penetration Yes  Pain with intercourse: none DrynessNo Climax: WNL Marinoff Scale: 0/3 Lubricant: yes  PREGNANCY: Vaginal  deliveries 1   PROLAPSE: None   OBJECTIVE:  Note: Objective measures were completed at Evaluation unless otherwise noted.  05/12/24 PATIENT SURVEYS:   PFIQ-7: 38  COGNITION: Overall cognitive status: Within functional limits for tasks assessed     SENSATION: Light touch:  Appears intact   FUNCTIONAL TESTS:  Squat: WNL Single leg stance:  Rt: stable  Lt: stable Curl-up test: WNL   GAIT: Assistive device utilized: none Comments: WNL  POSTURE: rounded shoulders, forward head, decreased lumbar lordosis, and posterior pelvic tilt   LUMBARAROM/PROM:  A/PROM A/PROM  Eval (% available)  Flexion 75  Extension 100  Right lateral flexion 100   Left lateral flexion 100  Right rotation 25  Left rotation 25   (Blank rows = not tested)  PALPATION:   General: WNL  Pelvic Alignment: Posterior pelvic tilt  Abdominal: decreased sternocostal angle; scar tissue restriction most notable in Rt lower quadrant; apical breathing pattern                External Perineal Exam: dryness, pale, red                             Internal Pelvic Floor: burning, not severe; most vaginal canal stenosis  Patient confirms identification and approves PT to assess internal pelvic floor and treatment Yes  PELVIC MMT:   MMT eval  Vaginal 1/5, 1 second hold, 4 repeat contractions; poor coordination  Diastasis Recti WNL  (Blank rows = not tested)        TONE: low  PROLAPSE: Grade 1 anterior vaginal wall prolapse  TODAY'S TREATMENT:                                                                                                                              DATE:  07/25/24 Neuromuscular re-education: Seated horizontal abduction green band 2 x 10 Seated resisted trunk rotation green band 2 x 10 Therapeutic activities: Reviewed other vulvar balms to moisturize with - sample given of medicine mama - instructed to stop using coconut  3 way kick 10x each, bil Heel raises 2 x 10 Pallof  press red band 2 x 10 bil Standing rows 2 x 10 green band Standing shoulder extension green band 2 x 10   07/21/24 Neuromuscular re-education: Seated horizontal abduction green band 2 x 10 Seated resisted trunk rotation green band 2 x 10 Seated red ball press 2 x 10 Seated red ball 2 x 10 Red ball up wall 12x Therapeutic activities: Using water wipes instead of other baby wipes to decrease irritation Vulvar balm instead of coconut oil: recommended medicine mama  Standing rows 2 x 10 green band Standing shoulder extension green band 2 x 10 Squats to elevated table 2 x 10 Standing 3 way kick 10x each bil   07/11/24 Neuromuscular re-education: Bridge with hip adduction, transversus abdominus, and pelvic floor muscle 2 x 10 Supine unilateral resisted hip abduction green band 10x bil Supine resisted march green band 2 x 10 Supine leg extensions 10x Seated horizontal abduction green band 2 x 10 Seated resisted trunk rotation green band 2 x 10 Exercises: Lower trunk rotation 2 x 10 Butterfly 10 breaths  PATIENT EDUCATION:  Education details: See above Person educated: Patient Education method: Explanation, Demonstration, Tactile cues, Verbal cues, and Handouts Education comprehension: verbalized understanding  HOME EXERCISE PROGRAM: [id number]  ASSESSMENT:  CLINICAL IMPRESSION: Patient is a 78 y.o. female who was seen today for physical therapy treatment for vaginal pain and burning. Pt still having increased symptoms. We decided to stop using coconut oil and she was given sample of medicine mama vulvar balm to try, testing it on forearm first. She did well with exercise progressions reporting large improvement in vaginal burning/stinging by the end of session. HEP not updated this session, but good tolerance to more challenging core/pelvic floor muscle activities in session. She will continue to benefit from skilled PT intervention in order to decrease vaginal pain, address  impairments, and improve quality of life.   OBJECTIVE IMPAIRMENTS: decreased activity tolerance, decreased coordination, decreased endurance, decreased mobility, decreased ROM, decreased strength, increased fascial restrictions, increased muscle spasms, impaired flexibility, impaired tone, improper body mechanics, postural dysfunction, and pain.   ACTIVITY LIMITATIONS: vaginal pain  PARTICIPATION LIMITATIONS: cleaning, community activity, and exercise  PERSONAL FACTORS: 1 comorbidity: medical history are also affecting patient's functional outcome.   REHAB POTENTIAL: Good  CLINICAL DECISION MAKING: Stable/uncomplicated  EVALUATION COMPLEXITY: Low   GOALS: Goals reviewed with patient? Yes  SHORT TERM GOALS: Target date: 06/09/2024   Pt will be independent with HEP in order to improve activity tolerance.   Baseline: Goal status: MET 07/11/24  2.  Patient will report 25% improve in vaginal pain in order to increase activity tolerance.    Baseline: 9/10; was doing much better, but had flare up of pain yesterday to 9/10 Goal status: IN PROGRESS 07/11/24  3.  Pt will increase pelvic floor muscle strength and coordination to 2/5 in order to improve vaginal canal mobility that will help decrease pain.   Baseline: 1/5 Goal status:  IN PROGRESS 07/11/24  4.  Pt will be independent with regular vaginal moisturizing to help decrease pain.  Baseline:  Goal status: MET 07/11/24   LONG TERM GOALS: Target date: 08/04/2025  Pt will be independent with advanced HEP in order to improve activity tolerance.   Baseline:  Goal status:  IN PROGRESS 07/11/24  2.  Patient will report 75% improve in vaginal pain in order to increase activity tolerance.   Baseline: 9/10 Goal status:  IN PROGRESS 07/11/24  3.  Pt will increase pelvic floor muscle strength and coordination to 3/5 in order to improve vaginal canal mobility that will help decrease pain.  Baseline:  Goal status:  IN PROGRESS  07/11/24  4.  Pt will increase pelvic floor muscle endurance to >10 seconds in order to improve pelvic floor muscle circulation and abdominal support that will help decrease pain.  Baseline:  Goal status:  IN PROGRESS 07/11/24    PLAN:  PT FREQUENCY: 1-2x/week  PT DURATION: 8 visits    PLANNED INTERVENTIONS: [id number]- PT Re-evaluation, 97110-Therapeutic exercises, 97530- Therapeutic activity, 97112- Neuromuscular re-education, 97535- Self Care, [id number]- Manual therapy, [phone]- Gait training, [phone]- Aquatic Therapy, [phone]- Electrical stimulation (unattended), [phone]- Traction (mechanical), [id number]- Ionotophoresis 4mg /ml Dexamethasone , 79439 (1-2 muscles), 20561 (3+ muscles)- Dry Needling, Patient/Family education, Balance training, Taping, Joint mobilization, Joint manipulation, Spinal manipulation, Spinal mobilization, Scar mobilization, Vestibular training, Cryotherapy, Moist heat, and Biofeedback  PLAN FOR NEXT SESSION: progress to core strengthening; abdominal scar tissue mobilization; address decreased thoracolumbar rotation; mobility exercises; re-evaluation or discharge  Josette Mares, PT, DPT11/11/2508:59 AM

## 2024-08-01 ENCOUNTER — Ambulatory Visit

## 2024-08-01 DIAGNOSIS — M6281 Muscle weakness (generalized): Secondary | ICD-10-CM | POA: Diagnosis not present

## 2024-08-01 DIAGNOSIS — R279 Unspecified lack of coordination: Secondary | ICD-10-CM

## 2024-08-01 NOTE — Therapy (Addendum)
 " OUTPATIENT PHYSICAL THERAPY FEMALE PELVIC TREATMENT   Patient Name: Christina Edwards MRN: [id number] DOB:Dec 29, 1945, 78 y.o., female Today's Date: 08/01/2024  END OF SESSION:  PT End of Session - 08/01/24 1104     Visit Number 7    Date for Recertification  10/24/24    Authorization Type [employer] Medicare    Authorization Time Period 05/12/2024-08/04/2024    Authorization - Visit Number 6    Authorization - Number of Visits 8    Progress Note Due on Visit 10    PT Start Time 1101    PT Stop Time 1142    PT Time Calculation (min) 41 min    Activity Tolerance Patient tolerated treatment well    Behavior During Therapy WFL for tasks assessed/performed           Past Medical History:  Diagnosis Date   Anemia    Basal cell carcinoma    Breast cancer (HCC) 1987   Left breast intraductal; lumpectomy, RT (unsure about receptor status)   Diverticulitis    Endometriosis    diagnosed in the 80s (ovary removed on one side)   GERD (gastroesophageal reflux disease)    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/2019   LGSIL on Pap smear of cervix    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    Nocturnal leg cramps    Osteopenia 11/2018   T score -1.9 asked 9% / 1.4%   Personal history of radiation therapy    PONV (postoperative nausea and vomiting)    Past Surgical History:  Procedure Laterality Date   APPENDECTOMY  1954   BREAST LUMPECTOMY Left 1987   BREAST SURGERY  1987   Lumpectomy   CERVICAL BIOPSY  W/ LOOP ELECTRODE EXCISION     COLONOSCOPY  02/2020   CYSTOSCOPY N/A 05/15/2020   Procedure: CYSTOSCOPY;  Surgeon: Arnaldo Purchase, MD;  Location: [facility];  Service: Gynecology;  Laterality: N/A;   EYE SURGERY     laser to fix torn retina   HAND SURGERY     left hand   LAPAROSCOPIC HYSTERECTOMY N/A 05/15/2020   Procedure: HYSTERECTOMY TOTAL LAPAROSCOPIC;  Surgeon: Arnaldo Purchase, MD;  [facility];  Service: Gynecology;  Laterality: N/A;  request 7:30 am OR time in [place] [facility] block requests two hours   LAPAROSCOPIC SALPINGO OOPHERECTOMY Right 05/15/2020   Procedure: LAPAROSCOPIC SALPINGO OOPHORECTOMY;  Surgeon: Arnaldo Purchase, MD;  Location: [facility];  Service: Gynecology;  Laterality: Right;   OOPHORECTOMY  1989   LSO   TONSILLECTOMY  1955   TUBAL LIGATION  1984   Patient Active Problem List   Diagnosis Date Noted   Dyspareunia, female 04/19/2024   Vaginal atrophy 01/19/2024   Stress incontinence 01/19/2024   Feeling of incomplete bladder emptying 01/19/2024   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 01/19/2024   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   Constipation 01/19/2024   Abnormal urinalysis 01/19/2024   CIN I (cervical intraepithelial neoplasia I) 05/26/2019   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   Pure hypercholesterolemia 05/24/2019   Coronary artery disease involving native coronary artery of native heart without angina pectoris 02/21/2019   Chest pain 02/04/2019   History of breast cancer 02/04/2019   Abnormal Pap smear, low grade squamous intraepithelial lesion (LGSIL) 04/26/2013   High grade squamous intraepithelial lesion (HGSIL), grade 2 CIN, on biopsy of cervix 09/10/2012   GERD (gastroesophageal reflux disease)  Endometriosis    Osteopenia    Breast cancer (HCC)     PCP: Claudene Pellet, MD  REFERRING PROVIDER: Guadlupe Lianne DASEN, MD   REFERRING DIAG: N39.3 (ICD-10-CM) - Stress incontinence N95.2 (ICD-10-CM) - Vaginal atrophy R39.14 (ICD-10-CM) - Feeling of incomplete bladder emptying  THERAPY DIAG:  Muscle weakness (generalized)  Unspecified lack of coordination  Rationale for Evaluation and Treatment: Rehabilitation  ONSET DATE: 06/2023  SUBJECTIVE:                                                                                                                                                                                            SUBJECTIVE STATEMENT: Pt states that she has been using Medicine Mama sample to moisturize. She feels like she has seen over 50% improvement in pain and urinary symptoms.    PAIN: 08/01/24 Are you having pain? Yes NPRS scale: 3-4/10 Pain location: Vaginal  Pain type: burning Pain description: intermittent   Aggravating factors: vaginal medication and moisturizers  Relieving factors: not putting anything in vaginal canal   PRECAUTIONS: Other: history of radiation therapy for breast cancer  RED FLAGS: None   WEIGHT BEARING RESTRICTIONS: No  FALLS:  Has patient fallen in last 6 months? No  OCCUPATION: retired child psychotherapist  ACTIVITY LEVEL : walking daily  PLOF: Independent  PATIENT GOALS: decrease vaginal pain  PERTINENT HISTORY:  Laparoscopic hysterectomy 2021, oophorectomy 1989, tubal ligation 1984, osteopenia, history of radiation/breast cancer 1987/2012, endometriosis, diverticulitis, GERD, vulvodynia, stage 2 rectocele, chronic constipation  Sexual abuse: No  BOWEL MOVEMENT: Pain with bowel movement: No Type of bowel movement:Type (Bristol Stool Scale) 4, Frequency doing better - 1x/day usually, and Strain sometimes Fully empty rectum: Yes: sometimes  Leakage: No Pads: No Fiber supplement/laxative Yes - fiber  URINATION: Pain with urination: No Fully empty bladder: Yes:   Stream: may have to double void Urgency: Yes  Frequency: every 2 hours; sometimes will wake up 1x/night Fluid Intake: 2-3 bottles of water a day; cup of tea in the morning; occasional diet coke Leakage: very occasional with coughing Pads: Yes: panty liner when she leaves the house (doctor has instructed her not to due to irritation)  INTERCOURSE:  Ability to have vaginal penetration Yes  Pain with intercourse: none DrynessNo Climax: WNL Marinoff Scale: 0/3 Lubricant: yes  PREGNANCY: Vaginal deliveries  1   PROLAPSE: None   OBJECTIVE:  Note: Objective measures were completed at Evaluation unless otherwise noted. 08/01/24               External Perineal Exam: dryness, pale, red  Internal Pelvic Floor: vaginal canal stenosis and adhesions improving, tone more appropriate, better moisture levels, stinging and burning still present superficially at posterior fourchette and Rt vaginal opening  Patient confirms identification and approves PT to assess internal pelvic floor and treatment Yes  PELVIC MMT:   MMT 08/01/24  Vaginal 1-2/5, 3 second hold, 5 repeat contractions; poor coordination  (Blank rows = not tested)        TONE: Lower than normal, but good improvements   PROLAPSE: Grade 1 anterior vaginal wall prolapse  PFIQ-7: 33 (UIQ-7)     05/12/24 PATIENT SURVEYS:   PFIQ-7: 38  COGNITION: Overall cognitive status: Within functional limits for tasks assessed     SENSATION: Light touch: Appears intact   FUNCTIONAL TESTS:  Squat: WNL Single leg stance:  Rt: stable  Lt: stable Curl-up test: WNL   GAIT: Assistive device utilized: none Comments: WNL  POSTURE: rounded shoulders, forward head, decreased lumbar lordosis, and posterior pelvic tilt   LUMBARAROM/PROM:  A/PROM A/PROM  Eval (% available)  Flexion 75  Extension 100  Right lateral flexion 100   Left lateral flexion 100  Right rotation 25  Left rotation 25   (Blank rows = not tested)  PALPATION:   General: WNL  Pelvic Alignment: Posterior pelvic tilt  Abdominal: decreased sternocostal angle; scar tissue restriction most notable in Rt lower quadrant; apical breathing pattern                External Perineal Exam: dryness, pale, red                             Internal Pelvic Floor: burning, not severe; most vaginal canal stenosis  Patient confirms identification and approves PT to assess internal pelvic floor and treatment Yes  PELVIC MMT:   MMT eval   Vaginal 1/5, 1 second hold, 4 repeat contractions; poor coordination  Diastasis Recti WNL  (Blank rows = not tested)        TONE: low  PROLAPSE: Grade 1 anterior vaginal wall prolapse  TODAY'S TREATMENT:                                                                                                                              DATE:  08/01/24 RE-EVAL Manual: Pt provides verbal consent for internal vaginal/rectal pelvic floor exam. Internal vaginal pelvic floor muscle reassessment  Introital myofascial release Rt>Lt Perineal body mobilization Superficial pelvic floor muscle adhesion release  Neuromuscular re-education: Pelvic floor muscle contraction training in supine with tactile feedback vaginally 2 x 10, working on breath coordination for improved strength Therapeutic activities: Used mirror to help educate patient on areas to make sure she is applying moisturizer to   07/25/24 Neuromuscular re-education: Seated horizontal abduction green band 2 x 10 Seated resisted trunk rotation green band 2 x 10 Therapeutic activities: Reviewed other vulvar balms to moisturize with - sample given of medicine mama -  instructed to stop using coconut  3 way kick 10x each, bil Heel raises 2 x 10 Pallof press red band 2 x 10 bil Standing rows 2 x 10 green band Standing shoulder extension green band 2 x 10   07/21/24 Neuromuscular re-education: Seated horizontal abduction green band 2 x 10 Seated resisted trunk rotation green band 2 x 10 Seated red ball press 2 x 10 Seated red ball 2 x 10 Red ball up wall 12x Therapeutic activities: Using water wipes instead of other baby wipes to decrease irritation Vulvar balm instead of coconut oil: recommended medicine mama  Standing rows 2 x 10 green band Standing shoulder extension green band 2 x 10 Squats to elevated table 2 x 10 Standing 3 way kick 10x each bil    PATIENT EDUCATION:  Education details: See above Person educated:  Patient Education method: Explanation, Demonstration, Tactile cues, Verbal cues, and Handouts Education comprehension: verbalized understanding  HOME EXERCISE PROGRAM: [id number]  ASSESSMENT:  CLINICAL IMPRESSION: Patient is a 78 y.o. female who was seen today for physical therapy treatment for vaginal pain and burning. Pt overall doing well, reporting over 50% improvement in vaginal pain and urinary symptoms. However, she is having vaginal burning/stinging symptoms still. We discussed making sure she is applying moisturizer to tender areas and used mirror to help make sure she knows where these areas are. Her strength is improving, but slowly and with ocnitnued difficulty with coordination. With multimodal cues to help guide her, she was able to achieve 2/5 strength in pelvic floor muscles. Believe as she continues to get stronger, she will have better circulation and pelvic floor muscle support, further improving discomfort. Her vaginal tone is improving and she demonstrates improvement in adhesions/stenosis. She will continue to benefit from skilled PT intervention in order to decrease vaginal pain, address impairments, and improve quality of life.   OBJECTIVE IMPAIRMENTS: decreased activity tolerance, decreased coordination, decreased endurance, decreased mobility, decreased ROM, decreased strength, increased fascial restrictions, increased muscle spasms, impaired flexibility, impaired tone, improper body mechanics, postural dysfunction, and pain.   ACTIVITY LIMITATIONS: vaginal pain  PARTICIPATION LIMITATIONS: cleaning, community activity, and exercise  PERSONAL FACTORS: 1 comorbidity: medical history are also affecting patient's functional outcome.   REHAB POTENTIAL: Good  CLINICAL DECISION MAKING: Stable/uncomplicated  EVALUATION COMPLEXITY: Low   GOALS: Goals reviewed with patient? Yes  SHORT TERM GOALS: Target date: 06/09/2024   Pt will be independent with HEP in order to  improve activity tolerance.   Baseline: Goal status: MET 07/11/24  2.  Patient will report 25% improve in vaginal pain in order to increase activity tolerance.    Baseline: 9/10; 3-4/10 Goal status:MET 08/11/24  3.  Pt will increase pelvic floor muscle strength and coordination to 2/5 in order to improve vaginal canal mobility that will help decrease pain.   Baseline: 1/5; was able to improve to 2/5 in session today with multimodal cues  Goal status:  MET 08/01/24  4.  Pt will be independent with regular vaginal moisturizing to help decrease pain.  Baseline:  Goal status: MET 07/11/24   LONG TERM GOALS: Target date: 08/04/2025  Pt will be independent with advanced HEP in order to improve activity tolerance.   Baseline: working weekly to progress advanced HEP Goal status:  IN PROGRESS 08/01/24  2.  Patient will report 75% improve in vaginal pain in order to increase activity tolerance.   Baseline: 9/10; reports that pain is over 50% better 3-4/10 Goal status:  IN PROGRESS 08/01/24  3.  Pt will increase pelvic floor muscle strength and coordination to 3/5 in order to improve vaginal canal mobility that will help decrease pain.  Baseline: initial 1/5, reached 2/5 today Goal status:  IN PROGRESS 08/01/24  4.  Pt will increase pelvic floor muscle endurance to >10 seconds in order to improve pelvic floor muscle circulation and abdominal support that will help decrease pain.  Baseline: initial 1 second; 3 second hold today Goal status:  IN PROGRESS 07/27/24    PLAN:  PT FREQUENCY: 1-2x/week  PT DURATION: 10 visits    PLANNED INTERVENTIONS: 97164- PT Re-evaluation, 97110-Therapeutic exercises, 97530- Therapeutic activity, 97112- Neuromuscular re-education, 97535- Self Care, 02859- Manual therapy, [phone]- Gait training, [phone]- Aquatic Therapy, [phone]- Electrical stimulation (unattended), [phone]- Traction (mechanical), [id number]- Ionotophoresis 4mg /ml Dexamethasone , 79439 (1-2  muscles), 20561 (3+ muscles)- Dry Needling, Patient/Family education, Balance training, Taping, Joint mobilization, Joint manipulation, Spinal manipulation, Spinal mobilization, Scar mobilization, Vestibular training, Cryotherapy, Moist heat, and Biofeedback  PLAN FOR NEXT SESSION: progress to core strengthening; abdominal scar tissue mobilization; address decreased thoracolumbar rotation; mobility exercises  Josette Mares, PT, DPT11/06/2511:09 PM  PHYSICAL THERAPY DISCHARGE SUMMARY  Visits from Start of Care: 7   Current functional level related to goals / functional outcomes: Independent   Remaining deficits: See above   Education / Equipment: HEP   Patient agrees to discharge. Patient goals were met. Patient is being discharged due to being pleased with the current functional level.  Josette Mares, PT, DPT12/31/202510:38 AM [facility] [address] [place], [place] [address] Phone # [phone] Fax [phone]   "

## 2024-08-03 ENCOUNTER — Telehealth: Payer: Self-pay

## 2024-08-03 NOTE — Telephone Encounter (Signed)
 Patient is seeing urogynecologist & they gave her a sample of Medicine Mama. She wanted to know if we also had a sample due to the cost of it. I let her know that we did not but for her to check with urogynocologist to see if they had a coupon she could use. Patient agrees.

## 2024-08-31 ENCOUNTER — Encounter (HOSPITAL_BASED_OUTPATIENT_CLINIC_OR_DEPARTMENT_OTHER): Payer: Self-pay | Admitting: Cardiology

## 2024-09-24 ENCOUNTER — Other Ambulatory Visit (HOSPITAL_BASED_OUTPATIENT_CLINIC_OR_DEPARTMENT_OTHER): Payer: Self-pay | Admitting: Cardiology

## 2024-09-29 ENCOUNTER — Ambulatory Visit

## 2024-10-06 ENCOUNTER — Ambulatory Visit

## 2024-10-10 ENCOUNTER — Telehealth: Payer: Self-pay

## 2024-10-12 ENCOUNTER — Other Ambulatory Visit: Payer: Self-pay | Admitting: Cardiology

## 2024-10-12 MED ORDER — ROSUVASTATIN CALCIUM 5 MG PO TABS: 5.0000 mg | ORAL_TABLET | Freq: Every day | ORAL | 0 refills | Status: AC

## 2024-10-12 NOTE — Telephone Encounter (Signed)
 Pt's medication was sent to pt's pharmacy as requested. Confirmation received.

## 2024-10-12 NOTE — Telephone Encounter (Signed)
" °*  STAT* If patient is at the pharmacy, call can be transferred to refill team.   1. Which medications need to be refilled? (please list name of each medication and dose if known)   rosuvastatin  (CRESTOR ) 5 MG tablet   2. Would you like to learn more about the convenience, safety, & potential cost savings by using the [facility]?   3. Are you open to using the [facility] (Type [facility]. ).  4. Which pharmacy/location (including street and city if local pharmacy) is medication to be sent to?  [facility] - [place], [place] - [address]   5. Do they need a 30 day or 90 day supply?   Patient still has medication until the end of February.  Patient has appointment scheduled with Dr. Lonni on 4/13.  Patient wants a call back to confirm refill sent. "

## 2024-10-13 ENCOUNTER — Ambulatory Visit

## 2024-10-20 ENCOUNTER — Ambulatory Visit

## 2025-01-02 ENCOUNTER — Ambulatory Visit (HOSPITAL_BASED_OUTPATIENT_CLINIC_OR_DEPARTMENT_OTHER): Admitting: Cardiology

## 2025-06-27 ENCOUNTER — Encounter: Admitting: Obstetrics and Gynecology
